# Patient Record
Sex: Male | Born: 1945 | Race: White | Hispanic: No | Smoking: Current every day smoker
Health system: Southern US, Community
[De-identification: ages and names within clinical notes are randomized; demographics above are authoritative.]

## PROBLEM LIST (undated history)

## (undated) DIAGNOSIS — K603 Anal fistula, unspecified: Secondary | ICD-10-CM

## (undated) DIAGNOSIS — J449 Chronic obstructive pulmonary disease, unspecified: Secondary | ICD-10-CM

## (undated) DIAGNOSIS — Z87828 Personal history of other (healed) physical injury and trauma: Secondary | ICD-10-CM

## (undated) DIAGNOSIS — F329 Major depressive disorder, single episode, unspecified: Secondary | ICD-10-CM

## (undated) DIAGNOSIS — E119 Type 2 diabetes mellitus without complications: Secondary | ICD-10-CM

## (undated) DIAGNOSIS — M1990 Unspecified osteoarthritis, unspecified site: Secondary | ICD-10-CM

## (undated) DIAGNOSIS — E785 Hyperlipidemia, unspecified: Secondary | ICD-10-CM

## (undated) DIAGNOSIS — I10 Essential (primary) hypertension: Secondary | ICD-10-CM

## (undated) DIAGNOSIS — J309 Allergic rhinitis, unspecified: Secondary | ICD-10-CM

## (undated) DIAGNOSIS — F419 Anxiety disorder, unspecified: Secondary | ICD-10-CM

## (undated) DIAGNOSIS — N189 Chronic kidney disease, unspecified: Secondary | ICD-10-CM

## (undated) DIAGNOSIS — F32A Depression, unspecified: Secondary | ICD-10-CM

## (undated) HISTORY — DX: Anxiety disorder, unspecified: F41.9

## (undated) HISTORY — DX: Chronic kidney disease, unspecified: N18.9

## (undated) HISTORY — DX: Chronic obstructive pulmonary disease, unspecified: J44.9

## (undated) HISTORY — DX: Allergic rhinitis, unspecified: J30.9

## (undated) HISTORY — DX: Depression, unspecified: F32.A

## (undated) HISTORY — DX: Major depressive disorder, single episode, unspecified: F32.9

## (undated) HISTORY — PX: APPENDECTOMY: SHX54

---

## 1994-01-16 HISTORY — PX: INGUINAL HERNIA REPAIR: SUR1180

## 1999-06-03 ENCOUNTER — Encounter: Payer: Self-pay | Admitting: Orthopedic Surgery

## 1999-06-03 ENCOUNTER — Ambulatory Visit: Admission: RE | Admit: 1999-06-03 | Discharge: 1999-06-03 | Payer: Self-pay | Admitting: Orthopedic Surgery

## 2000-07-04 ENCOUNTER — Encounter: Payer: Self-pay | Admitting: Emergency Medicine

## 2000-07-04 ENCOUNTER — Emergency Department (HOSPITAL_COMMUNITY): Admission: EM | Admit: 2000-07-04 | Discharge: 2000-07-04 | Payer: Self-pay | Admitting: Emergency Medicine

## 2001-01-16 HISTORY — PX: ROTATOR CUFF REPAIR: SHX139

## 2005-04-06 ENCOUNTER — Encounter: Admission: RE | Admit: 2005-04-06 | Discharge: 2005-04-06 | Payer: Self-pay | Admitting: Internal Medicine

## 2012-04-18 ENCOUNTER — Other Ambulatory Visit: Payer: Self-pay | Admitting: Cardiology

## 2012-04-18 ENCOUNTER — Ambulatory Visit
Admission: RE | Admit: 2012-04-18 | Discharge: 2012-04-18 | Disposition: A | Discharge status: Home / Self Care | Payer: BC Managed Care – PPO | Source: Ambulatory Visit | Attending: Cardiology | Admitting: Cardiology

## 2012-04-18 DIAGNOSIS — R0602 Shortness of breath: Secondary | ICD-10-CM

## 2012-09-17 DIAGNOSIS — E785 Hyperlipidemia, unspecified: Secondary | ICD-10-CM | POA: Diagnosis not present

## 2012-09-17 DIAGNOSIS — I10 Essential (primary) hypertension: Secondary | ICD-10-CM | POA: Diagnosis not present

## 2012-09-17 DIAGNOSIS — E119 Type 2 diabetes mellitus without complications: Secondary | ICD-10-CM | POA: Diagnosis not present

## 2012-09-17 DIAGNOSIS — E669 Obesity, unspecified: Secondary | ICD-10-CM | POA: Diagnosis not present

## 2012-10-11 DIAGNOSIS — E119 Type 2 diabetes mellitus without complications: Secondary | ICD-10-CM | POA: Diagnosis not present

## 2012-10-11 DIAGNOSIS — Z6841 Body Mass Index (BMI) 40.0 and over, adult: Secondary | ICD-10-CM | POA: Diagnosis not present

## 2012-10-11 DIAGNOSIS — I10 Essential (primary) hypertension: Secondary | ICD-10-CM | POA: Diagnosis not present

## 2012-11-07 DIAGNOSIS — F17200 Nicotine dependence, unspecified, uncomplicated: Secondary | ICD-10-CM | POA: Diagnosis not present

## 2012-11-07 DIAGNOSIS — I10 Essential (primary) hypertension: Secondary | ICD-10-CM | POA: Diagnosis not present

## 2012-11-07 DIAGNOSIS — E119 Type 2 diabetes mellitus without complications: Secondary | ICD-10-CM | POA: Diagnosis not present

## 2012-11-07 DIAGNOSIS — E785 Hyperlipidemia, unspecified: Secondary | ICD-10-CM | POA: Diagnosis not present

## 2013-01-14 DIAGNOSIS — I10 Essential (primary) hypertension: Secondary | ICD-10-CM | POA: Diagnosis not present

## 2013-01-14 DIAGNOSIS — Z79899 Other long term (current) drug therapy: Secondary | ICD-10-CM | POA: Diagnosis not present

## 2013-01-14 DIAGNOSIS — Z0000 Encounter for general adult medical examination without abnormal findings: Secondary | ICD-10-CM | POA: Diagnosis not present

## 2013-01-14 DIAGNOSIS — E785 Hyperlipidemia, unspecified: Secondary | ICD-10-CM | POA: Diagnosis not present

## 2013-01-14 DIAGNOSIS — F17200 Nicotine dependence, unspecified, uncomplicated: Secondary | ICD-10-CM | POA: Diagnosis not present

## 2013-03-18 DIAGNOSIS — E119 Type 2 diabetes mellitus without complications: Secondary | ICD-10-CM | POA: Diagnosis not present

## 2013-03-18 DIAGNOSIS — Z79899 Other long term (current) drug therapy: Secondary | ICD-10-CM | POA: Diagnosis not present

## 2013-03-18 DIAGNOSIS — I10 Essential (primary) hypertension: Secondary | ICD-10-CM | POA: Diagnosis not present

## 2013-03-18 DIAGNOSIS — E785 Hyperlipidemia, unspecified: Secondary | ICD-10-CM | POA: Diagnosis not present

## 2013-06-18 DIAGNOSIS — I10 Essential (primary) hypertension: Secondary | ICD-10-CM | POA: Diagnosis not present

## 2013-06-18 DIAGNOSIS — E785 Hyperlipidemia, unspecified: Secondary | ICD-10-CM | POA: Diagnosis not present

## 2013-06-18 DIAGNOSIS — E119 Type 2 diabetes mellitus without complications: Secondary | ICD-10-CM | POA: Diagnosis not present

## 2013-06-18 DIAGNOSIS — Z79899 Other long term (current) drug therapy: Secondary | ICD-10-CM | POA: Diagnosis not present

## 2014-03-27 DIAGNOSIS — R3915 Urgency of urination: Secondary | ICD-10-CM | POA: Diagnosis not present

## 2014-03-27 DIAGNOSIS — E1165 Type 2 diabetes mellitus with hyperglycemia: Secondary | ICD-10-CM | POA: Diagnosis not present

## 2014-03-27 DIAGNOSIS — N508 Other specified disorders of male genital organs: Secondary | ICD-10-CM | POA: Diagnosis not present

## 2014-03-27 DIAGNOSIS — E559 Vitamin D deficiency, unspecified: Secondary | ICD-10-CM | POA: Diagnosis not present

## 2014-03-27 DIAGNOSIS — I10 Essential (primary) hypertension: Secondary | ICD-10-CM | POA: Diagnosis not present

## 2014-03-27 DIAGNOSIS — R5383 Other fatigue: Secondary | ICD-10-CM | POA: Diagnosis not present

## 2014-03-27 DIAGNOSIS — E782 Mixed hyperlipidemia: Secondary | ICD-10-CM | POA: Diagnosis not present

## 2014-03-27 LAB — BASIC METABOLIC PANEL
BUN: 25 mg/dL — AB (ref 4–21)
Creatinine: 1.1 mg/dL (ref 0.6–1.3)
GLUCOSE: 116 mg/dL
Potassium: 4.1 mmol/L (ref 3.4–5.3)
Sodium: 140 mmol/L (ref 137–147)

## 2014-03-27 LAB — LIPID PANEL
Cholesterol: 95 mg/dL (ref 0–200)
HDL: 44 mg/dL (ref 35–70)
LDL CALC: 17 mg/dL
TRIGLYCERIDES: 170 mg/dL — AB (ref 40–160)

## 2014-03-27 LAB — HEPATIC FUNCTION PANEL
ALK PHOS: 46 U/L (ref 25–125)
ALT: 21 U/L (ref 10–40)
AST: 15 U/L (ref 14–40)
BILIRUBIN, TOTAL: 0.5 mg/dL

## 2014-03-27 LAB — HEMOGLOBIN A1C: Hemoglobin A1C: 6.2

## 2014-03-27 LAB — CBC AND DIFFERENTIAL
HEMATOCRIT: 43 % (ref 41–53)
Hemoglobin: 14 g/dL (ref 13.5–17.5)
PLATELETS: 211 10*3/uL (ref 150–399)
WBC: 7.3 10*3/mL

## 2014-03-27 LAB — PSA: PSA: 0.5

## 2014-03-27 LAB — TSH: TSH: 4.41 u[IU]/mL (ref 0.41–5.90)

## 2014-04-24 DIAGNOSIS — R3915 Urgency of urination: Secondary | ICD-10-CM | POA: Diagnosis not present

## 2014-05-29 DIAGNOSIS — N433 Hydrocele, unspecified: Secondary | ICD-10-CM | POA: Diagnosis not present

## 2014-06-17 ENCOUNTER — Ambulatory Visit (INDEPENDENT_AMBULATORY_CARE_PROVIDER_SITE_OTHER): Payer: BLUE CROSS/BLUE SHIELD

## 2014-06-17 ENCOUNTER — Ambulatory Visit (INDEPENDENT_AMBULATORY_CARE_PROVIDER_SITE_OTHER): Payer: BLUE CROSS/BLUE SHIELD | Admitting: Physician Assistant

## 2014-06-17 VITALS — BP 140/88 | HR 83 | Temp 98.6°F | Resp 18 | Ht 73.0 in | Wt 256.6 lb

## 2014-06-17 DIAGNOSIS — E119 Type 2 diabetes mellitus without complications: Secondary | ICD-10-CM

## 2014-06-17 DIAGNOSIS — M25512 Pain in left shoulder: Secondary | ICD-10-CM

## 2014-06-17 DIAGNOSIS — M7540 Impingement syndrome of unspecified shoulder: Secondary | ICD-10-CM | POA: Insufficient documentation

## 2014-06-17 DIAGNOSIS — E1165 Type 2 diabetes mellitus with hyperglycemia: Secondary | ICD-10-CM | POA: Insufficient documentation

## 2014-06-17 DIAGNOSIS — M7542 Impingement syndrome of left shoulder: Secondary | ICD-10-CM | POA: Diagnosis not present

## 2014-06-17 LAB — GLUCOSE, POCT (MANUAL RESULT ENTRY): POC Glucose: 105 mg/dl — AB (ref 70–99)

## 2014-06-17 LAB — POCT GLYCOSYLATED HEMOGLOBIN (HGB A1C): Hemoglobin A1C: 6.3

## 2014-06-17 MED ORDER — METFORMIN HCL ER 750 MG PO TB24: 750.0000 mg | ORAL_TABLET | Freq: Two times a day (BID) | ORAL | Status: DC

## 2014-06-17 MED ORDER — TRAMADOL HCL 50 MG PO TABS: 50.0000 mg | ORAL_TABLET | Freq: Three times a day (TID) | ORAL | Status: DC | PRN

## 2014-06-17 NOTE — Progress Notes (Addendum)
Subjective:    Patient ID: John Blake, male    DOB: 03-27-1945, 69 y.o.   MRN: [id number]  HPI Patient presents for refill of metformin and shoulder pain.  Has been taking metformin for 1 month for pre-diabetes that was diagnosed earlier this year. Unsure what medication he was on initially, but discontinued due to cost. No longer has PCP as office has closed down. Medication has been tolerated well and has not missed any doses. Denies SOB, CP, edema, HA/dizziness, or N/V. Does not check glucose at home.   Shoulder pain has been present for several months, but has gotten worse as now the pain is keeping him up at night. Pain is 8/10 on pain scale and is throbbing in quality. Pain radiates up to the neck at times. Can not raise arm higher than chest. Has been unable to reach up into cabinets for at least a month. Denies numbnessor tingling of shoulder or lower extremity. Right shoulder went through something similar before he had his rotator cuff surgery.   NKDA.   Review of Systems As noted above.     Objective:   Physical Exam  Constitutional: He is oriented to person, place, and time. He appears well-developed and well-nourished. No distress.  Blood pressure 140/88, pulse 83, temperature 98.6 F (37 C), temperature source Oral, resp. rate 18, height 6\' 1"  (1.854 m), weight 256 lb 9.6 oz (116.393 kg), SpO2 98 %.  HENT:  Head: Normocephalic and atraumatic.  Right Ear: External ear normal.  Left Ear: External ear normal.  Eyes: Conjunctivae are normal. Pupils are equal, round, and reactive to light. Right eye exhibits no discharge. Left eye exhibits no discharge. No scleral icterus.  Neck: Normal range of motion. Neck supple.  Cardiovascular: Normal rate, regular rhythm, normal heart sounds and intact distal pulses.  Exam reveals no gallop and no friction rub.   No murmur heard. Pulmonary/Chest: Effort normal and breath sounds normal. No respiratory distress. He has no wheezes. He  has no rales.  Musculoskeletal: He exhibits tenderness. He exhibits no edema.       Left shoulder: He exhibits decreased range of motion (Able to raise arm to approx 70 degrees), tenderness and pain. He exhibits no bony tenderness, no swelling, no effusion, no crepitus, no deformity, no laceration, no spasm and normal strength.       Cervical back: He exhibits tenderness. He exhibits normal range of motion, no bony tenderness, no swelling, no edema, no deformity, no laceration, no pain and no spasm.       Thoracic back: Normal.       Left hand: Normal.  Positive Neer's and Hawkin's test. Negative Crossover test.  Neurological: He is alert and oriented to person, place, and time. He has normal strength and normal reflexes. He displays no atrophy. He exhibits normal muscle tone. Coordination normal.  Skin: Skin is warm and dry. No rash noted. He is not diaphoretic. No erythema. No pallor.  Psychiatric: He has a normal mood and affect. His behavior is normal. Judgment and thought content normal.   [facility] reading (PRIMARY) by  Dr. Carlota Raspberry. Degenerative changes at [facility] and glenohumeral joints. No acute fractures.       Assessment & Plan:  1. Type 2 diabetes mellitus without complication RTC in 3 months for DM f/u. - POCT glucose (manual entry) - POCT glycosylated hemoglobin (Hb A1C) - Comprehensive metabolic panel  2. Pain in joint, shoulder region, left 3. Shoulder impingement syndrome, left Improvement of pain  with lido/kenalog injection, however, no change in ROM. Shoulder exercises given and discussed.  - DG Shoulder Left; Future - traMADol (ULTRAM) 50 MG tablet; Take 1 tablet (50 mg total) by mouth every 8 (eight) hours as needed.  Dispense: 30 tablet; Refill: 0 - AMB referral to orthopedics   Alveta Heimlich PA-C  [facility] 06/17/2014 8:01 PM

## 2014-06-17 NOTE — Patient Instructions (Signed)

## 2014-06-18 LAB — COMPREHENSIVE METABOLIC PANEL
ALBUMIN: 4.2 g/dL (ref 3.5–5.2)
ALK PHOS: 47 U/L (ref 39–117)
ALT: 23 U/L (ref 0–53)
AST: 19 U/L (ref 0–37)
BUN: 27 mg/dL — ABNORMAL HIGH (ref 6–23)
CO2: 22 mEq/L (ref 19–32)
CREATININE: 1.18 mg/dL (ref 0.50–1.35)
Calcium: 9.1 mg/dL (ref 8.4–10.5)
Chloride: 108 mEq/L (ref 96–112)
Glucose, Bld: 99 mg/dL (ref 70–99)
Potassium: 4 mEq/L (ref 3.5–5.3)
SODIUM: 137 meq/L (ref 135–145)
Total Bilirubin: 0.5 mg/dL (ref 0.2–1.2)
Total Protein: 6.7 g/dL (ref 6.0–8.3)

## 2014-06-23 ENCOUNTER — Encounter: Payer: Self-pay | Admitting: Physician Assistant

## 2014-08-01 ENCOUNTER — Encounter: Payer: Self-pay | Admitting: *Deleted

## 2014-08-01 ENCOUNTER — Ambulatory Visit (INDEPENDENT_AMBULATORY_CARE_PROVIDER_SITE_OTHER): Payer: BLUE CROSS/BLUE SHIELD | Admitting: Family Medicine

## 2014-08-01 VITALS — BP 128/76 | HR 105 | Temp 99.4°F | Resp 18 | Ht 73.0 in | Wt 245.4 lb

## 2014-08-01 DIAGNOSIS — K641 Second degree hemorrhoids: Secondary | ICD-10-CM | POA: Diagnosis not present

## 2014-08-01 DIAGNOSIS — K645 Perianal venous thrombosis: Secondary | ICD-10-CM | POA: Diagnosis not present

## 2014-08-01 DIAGNOSIS — K6289 Other specified diseases of anus and rectum: Secondary | ICD-10-CM | POA: Diagnosis not present

## 2014-08-01 MED ORDER — HYDROCODONE-ACETAMINOPHEN 5-325 MG PO TABS: 1.0000 | ORAL_TABLET | Freq: Four times a day (QID) | ORAL | Status: DC | PRN

## 2014-08-01 MED ORDER — CIPROFLOXACIN HCL 500 MG PO TABS: 500.0000 mg | ORAL_TABLET | Freq: Two times a day (BID) | ORAL | Status: DC

## 2014-08-01 MED ORDER — HYDROCORTISONE ACE-PRAMOXINE 2.5-1 % RE CREA: 1.0000 "application " | TOPICAL_CREAM | Freq: Three times a day (TID) | RECTAL | Status: DC

## 2014-08-01 NOTE — Progress Notes (Signed)
  Subjective:  Patient ID: John Blake, male    DOB: Mar 28, 1945  Age: 69 y.o. MRN: [id number]  69 year old male who has just come back from the beach. Riding down he developed anal pain. It is continued to persist. It is hard for him to sit now. He has had hemorrhoids in the past but this is different. He has not felt any major hemorrhoids. He hurts a lot right it is but. No nausea or vomiting or diarrhea or constipation. He has had a decreased appetite. He is diabetic.  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 He thinks he was okay to work Monday, however I told to him that I would give him an excuse for Monday if he needs it.  Objective:   Abdomen is large, no specific masses appreciated, no tenderness. He feels full. Digital rectal exam reveals the external anal area to have probably 4 separate hemorrhoidal veins swollen out, not large. They do not look inflamed. They're little tender but he is more tender in an indurated area of tissue at about the 11:00 position. It is not fluctuant. Feel like it is abscess. It may be old chronic induration of the tissues, but it does seem more tender.  Assessment & Plan:   Assessment: Hemorrhoids Anal pain Swallowing, of concern for possible early abscess.  Plan: Patient Instructions  Take the ciprofloxacin one twice daily for possible infection  Take the hydrocodone pain pills every 6 hours if needed for bad pain  Use the Analpram cream 2-4 times daily as directed  Take Colace per instructions on the container daily for stool softener   Drink plenty of fluids and eat a high-fiber diet  If constipated takes some MiraLAX  Stay off work Monday if still having a lot of pain  If not improving in 3 or 4 days return for a recheck     HOPPER,DAVID, MD 08/01/2014

## 2014-08-01 NOTE — Patient Instructions (Signed)
Take the ciprofloxacin one twice daily for possible infection  Take the hydrocodone pain pills every 6 hours if needed for bad pain  Use the Analpram cream 2-4 times daily as directed  Take Colace per instructions on the container daily for stool softener   Drink plenty of fluids and eat a high-fiber diet  If constipated takes some MiraLAX  Stay off work Monday if still having a lot of pain  If not improving in 3 or 4 days return for a recheck

## 2014-08-03 ENCOUNTER — Ambulatory Visit (INDEPENDENT_AMBULATORY_CARE_PROVIDER_SITE_OTHER): Payer: BLUE CROSS/BLUE SHIELD | Admitting: Family Medicine

## 2014-08-03 VITALS — BP 102/54 | HR 86 | Temp 98.2°F | Resp 18 | Ht 73.0 in | Wt 243.0 lb

## 2014-08-03 DIAGNOSIS — R42 Dizziness and giddiness: Secondary | ICD-10-CM

## 2014-08-03 DIAGNOSIS — K6289 Other specified diseases of anus and rectum: Secondary | ICD-10-CM

## 2014-08-03 DIAGNOSIS — E119 Type 2 diabetes mellitus without complications: Secondary | ICD-10-CM

## 2014-08-03 LAB — POCT CBC
Granulocyte percent: 77 %G (ref 37–80)
HCT, POC: 40.1 % — AB (ref 43.5–53.7)
Hemoglobin: 13.5 g/dL — AB (ref 14.1–18.1)
LYMPH, POC: 2.6 (ref 0.6–3.4)
MCH, POC: 31.4 pg — AB (ref 27–31.2)
MCHC: 33.7 g/dL (ref 31.8–35.4)
MCV: 93.1 fL (ref 80–97)
MID (cbc): 1.2 — AB (ref 0–0.9)
MPV: 8.4 fL (ref 0–99.8)
POC Granulocyte: 12.7 — AB (ref 2–6.9)
POC LYMPH %: 15.9 % (ref 10–50)
POC MID %: 7.1 %M (ref 0–12)
Platelet Count, POC: 282 10*3/uL (ref 142–424)
RBC: 4.31 M/uL — AB (ref 4.69–6.13)
RDW, POC: 13.8 %
WBC: 16.5 10*3/uL — AB (ref 4.6–10.2)

## 2014-08-03 LAB — GLUCOSE, POCT (MANUAL RESULT ENTRY): POC GLUCOSE: 131 mg/dL — AB (ref 70–99)

## 2014-08-03 NOTE — Patient Instructions (Addendum)
Drink lots of fluids until you are urinating more often  If you lightheadedness gets worse go to the emergency room  Referral has been made to [facility] surgery. The appointment is at 2:30 PM but they request that you be there at 2 PM 2 do the necessary paperwork. Your appointment is with a Dr. Redmond Pulling.  Take the MiraLAX twice daily if you have not had a bowel movement.  Try and do some sitz baths, soaking your bottom in a tub.  Continue using the cream and taking the oral anabiotic. The white blood count is elevated and suspicious for this being caused by an infection, so the anabiotic are very important.

## 2014-08-03 NOTE — Progress Notes (Signed)
  Subjective:  Patient ID: John Blake, male    DOB: [birth year]  Age: 68 y.o. MRN: [id number]  Patient continues to be pretty miserable. I saw him 2 days ago. He has not taken pain pills, but he has taken some MiraLAX. He did not take the Colace. He continues to hurt a lot in his rectal area. He has not been able to move his bowels, and is not urinating much. He says he has been drinking a lot of water. However he has been lightheaded some. He is diabetic but takes his medication and checks his sugars. This does not seem to have been a big problem to him. He has a hard time sitting and getting comfortable.   Objective:   Appears in a good deal of discomfort. Chest clear. Heart regular without murmurs. His rectal exam reveals prominence of the hemorrhoidal pain especially at about the 4:00 position but that is not really a tender hemorrhoid. Digital exam reveals a soft swelling especially on the left side of the rectum that is very tender. There is not erythema. I'm uncertain what this represents.  Results for orders placed or performed in visit on 08/03/14  POCT CBC  Result Value Ref Range   WBC 16.5 (A) 4.6 - 10.2 K/uL   Lymph, poc 2.6 0.6 - 3.4   POC LYMPH PERCENT 15.9 10 - 50 %L   MID (cbc) 1.2 (A) 0 - 0.9   POC MID % 7.1 0 - 12 %M   POC Granulocyte 12.7 (A) 2 - 6.9   Granulocyte percent 77.0 37 - 80 %G   RBC 4.31 (A) 4.69 - 6.13 M/uL   Hemoglobin 13.5 (A) 14.1 - 18.1 g/dL   HCT, POC 40.1 (A) 43.5 - 53.7 %   MCV 93.1 80 - 97 fL   MCH, POC 31.4 (A) 27 - 31.2 pg   MCHC 33.7 31.8 - 35.4 g/dL   RDW, POC 13.8 %   Platelet Count, POC 282 142 - 424 K/uL   MPV 8.4 0 - 99.8 fL  POCT glucose (manual entry)  Result Value Ref Range   POC Glucose 131 (A) 70 - 99 mg/dl     Assessment & Plan:   Assessment: Rectal pain and tenderness Diabetes Lightheadedness Constipation  Plan: Will check CBC and glucose and C met. I spoke with [place] [facility] surgery and he will be seen in their  urgent clinic by Dr. Redmond Pulling tomorrow.  With the elevated white blood count I am concerned that this is from infection. The surgeon may want need to do a CT scan or something more to figure out etiology of the pain. Patient Instructions  Drink lots of fluids until you are urinating more often  If you lightheadedness gets worse go to the emergency room  Referral has been made to [facility] surgery. The appointment is at 2:30 PM but they request that you be there at 2 PM 2 do the necessary paperwork. Your appointment is with a Dr. Redmond Pulling.  Take the MiraLAX twice daily if you have not had a bowel movement.  Try and do some sitz baths, soaking your bottom in a tub.  Continue using the cream and taking the oral anabiotic    Smt. Loder, MD 08/03/2014

## 2014-08-04 LAB — COMPREHENSIVE METABOLIC PANEL
ALK PHOS: 65 U/L (ref 39–117)
ALT: 25 U/L (ref 0–53)
AST: 25 U/L (ref 0–37)
Albumin: 4.1 g/dL (ref 3.5–5.2)
BUN: 52 mg/dL — ABNORMAL HIGH (ref 6–23)
CALCIUM: 9.7 mg/dL (ref 8.4–10.5)
CHLORIDE: 98 meq/L (ref 96–112)
CO2: 24 mEq/L (ref 19–32)
CREATININE: 2.41 mg/dL — AB (ref 0.50–1.35)
Glucose, Bld: 133 mg/dL — ABNORMAL HIGH (ref 70–99)
Potassium: 3.7 mEq/L (ref 3.5–5.3)
SODIUM: 139 meq/L (ref 135–145)
TOTAL PROTEIN: 7.3 g/dL (ref 6.0–8.3)
Total Bilirubin: 0.7 mg/dL (ref 0.2–1.2)

## 2014-08-05 DIAGNOSIS — K611 Rectal abscess: Secondary | ICD-10-CM | POA: Diagnosis not present

## 2014-09-18 ENCOUNTER — Other Ambulatory Visit: Payer: Self-pay | Admitting: Physician Assistant

## 2014-09-30 ENCOUNTER — Other Ambulatory Visit: Payer: Self-pay | Admitting: General Surgery

## 2014-09-30 NOTE — H&P (Signed)
Lawyer H. Mattila 09/30/2014 3:51 PM Location: [facility] Patient #: [id number] DOB: 04-26-1945 Married / Language: Cleophus Molt / Race: White Male History of Present Illness [name] MD; 99991111 4:02 PM) Patient words: reck.  The patient is a 69 year old male who presents with a perirectal abscess. 69 year old male approximately 8 weeks status post I&D of perirectal abscess. He presents to the office with continued purulent drainage. This is occasionally blood as well. Patient is an avid smoker. He denies any rectal pain or fevers. 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  Diagnostic Studies History [name], MD; 99991111 4:05 PM) Colonoscopy 5-10 years ago  Allergies Davy Pique Bynum, [place]; 09/30/2014 3:51 PM) No Known Drug Allergies 08/05/2014  Medication History (Sonya Bynum, CMA; 09/30/2014 3:51 PM) Atorvastatin Calcium (20MG  Tablet, Oral) Active. Ciprofloxacin HCl (500MG  Tablet, Oral) Active. Janumet (50-500MG  Tablet, Oral) Active. Valsartan-Hydrochlorothiazide (320-25MG  Tablet, Oral) Active. MetFORMIN HCl ER (750MG  Tablet ER 24HR, Oral) Active. Hydrocortisone Ace-Pramoxine (2.5-1% Cream, Rectal) Active. Norco (5-325MG  Tablet, Oral) Active. Medications Reconciled  Social History [name], MD; 99991111 4:05 PM) Caffeine use Coffee. No drug use Alcohol use Moderate alcohol use. Tobacco use Current every day smoker.  Family History [name], MD; 99991111 4:05 PM) Family history unknown First Degree  Relatives    Vitals ([place]; 09/30/2014 3:51 PM) 09/30/2014 3:51 PM Weight: 251 lb Height: 73in Body Surface Area: 2.42 m Body Mass Index: 33.12 kg/m Temp.: 35F(Temporal)  Pulse: 81 (Regular)  BP: 134/74 (Sitting, Left Arm, Standard)     Physical Exam [name] MD; 99991111 4:03 PM)  General Mental Status-Alert. General Appearance-Consistent with stated age. Hydration-Well hydrated. Voice-Normal.  Head and Neck Head-normocephalic, atraumatic with no lesions or palpable masses. Trachea-midline.  Eye Eyeball - Bilateral-Extraocular movements intact. Sclera/Conjunctiva - Bilateral-No scleral icterus.  Chest and Lung Exam Chest and lung exam reveals -quiet, even and easy respiratory effort with no use of accessory muscles and on auscultation, normal breath sounds, no adventitious sounds and normal vocal resonance. Inspection Chest Wall - Normal. Back - normal.  Cardiovascular Cardiovascular examination reveals -normal heart sounds, regular rate and rhythm with no murmurs and normal pedal pulses bilaterally.  Abdomen Inspection Inspection of the abdomen reveals - No Hernias. Palpation/Percussion Palpation and Percussion of the abdomen reveal - Soft, Non Tender, No Rebound tenderness, No Rigidity (guarding) and No hepatosplenomegaly. Auscultation Auscultation of the abdomen reveals - Bowel sounds normal.  Rectal Anorectal Exam External - Note: Left posterior external opening, fistula probe easily progresses towards midline posteriorly.  Neurologic Neurologic evaluation reveals -alert and oriented x 3 with no impairment of recent or remote memory. Mental Status-Normal.  Musculoskeletal Global Assessment -Note:no gross deformities.  Normal Exam - Left-Upper Extremity Strength Normal and Lower Extremity Strength Normal. Normal Exam - Right-Upper Extremity Strength Normal and Lower Extremity Strength  Normal.    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 I have recommended anal exam under anesthesia with possible fistulotomy versus seton placement. We discussed this in detail. We discussed the minimal risk of  incontinence with fistulotomy. We discussed the need for second surgery with seton placement.

## 2014-10-19 ENCOUNTER — Other Ambulatory Visit: Payer: Self-pay | Admitting: Physician Assistant

## 2014-10-22 ENCOUNTER — Other Ambulatory Visit: Payer: Self-pay | Admitting: Physician Assistant

## 2014-10-26 ENCOUNTER — Encounter (HOSPITAL_BASED_OUTPATIENT_CLINIC_OR_DEPARTMENT_OTHER): Payer: Self-pay | Admitting: *Deleted

## 2014-10-26 NOTE — Progress Notes (Signed)
NPO AFTER MN.  ARRIVE AT 0600.  NEEDS ISTAT AND EKG.

## 2014-10-30 ENCOUNTER — Ambulatory Visit (HOSPITAL_BASED_OUTPATIENT_CLINIC_OR_DEPARTMENT_OTHER)
Admission: RE | Admit: 2014-10-30 | Discharge: 2014-10-30 | Disposition: A | Discharge status: Home / Self Care | Payer: BLUE CROSS/BLUE SHIELD | Source: Ambulatory Visit | Attending: General Surgery | Admitting: General Surgery

## 2014-10-30 ENCOUNTER — Ambulatory Visit (HOSPITAL_BASED_OUTPATIENT_CLINIC_OR_DEPARTMENT_OTHER): Payer: BLUE CROSS/BLUE SHIELD | Admitting: Anesthesiology

## 2014-10-30 ENCOUNTER — Encounter (HOSPITAL_BASED_OUTPATIENT_CLINIC_OR_DEPARTMENT_OTHER)
Admission: RE | Disposition: A | Discharge status: Home / Self Care | Payer: Self-pay | Source: Ambulatory Visit | Attending: General Surgery

## 2014-10-30 ENCOUNTER — Other Ambulatory Visit: Payer: Self-pay

## 2014-10-30 ENCOUNTER — Encounter (HOSPITAL_BASED_OUTPATIENT_CLINIC_OR_DEPARTMENT_OTHER): Payer: Self-pay | Admitting: *Deleted

## 2014-10-30 DIAGNOSIS — K644 Residual hemorrhoidal skin tags: Secondary | ICD-10-CM | POA: Diagnosis not present

## 2014-10-30 DIAGNOSIS — Z792 Long term (current) use of antibiotics: Secondary | ICD-10-CM | POA: Insufficient documentation

## 2014-10-30 DIAGNOSIS — Z7952 Long term (current) use of systemic steroids: Secondary | ICD-10-CM | POA: Insufficient documentation

## 2014-10-30 DIAGNOSIS — K648 Other hemorrhoids: Secondary | ICD-10-CM | POA: Insufficient documentation

## 2014-10-30 DIAGNOSIS — E7800 Pure hypercholesterolemia, unspecified: Secondary | ICD-10-CM | POA: Diagnosis not present

## 2014-10-30 DIAGNOSIS — I10 Essential (primary) hypertension: Secondary | ICD-10-CM | POA: Insufficient documentation

## 2014-10-30 DIAGNOSIS — M1990 Unspecified osteoarthritis, unspecified site: Secondary | ICD-10-CM | POA: Diagnosis not present

## 2014-10-30 DIAGNOSIS — Z7984 Long term (current) use of oral hypoglycemic drugs: Secondary | ICD-10-CM | POA: Insufficient documentation

## 2014-10-30 DIAGNOSIS — Z79899 Other long term (current) drug therapy: Secondary | ICD-10-CM | POA: Diagnosis not present

## 2014-10-30 DIAGNOSIS — E119 Type 2 diabetes mellitus without complications: Secondary | ICD-10-CM | POA: Insufficient documentation

## 2014-10-30 DIAGNOSIS — K611 Rectal abscess: Secondary | ICD-10-CM | POA: Diagnosis present

## 2014-10-30 DIAGNOSIS — Z79891 Long term (current) use of opiate analgesic: Secondary | ICD-10-CM | POA: Diagnosis not present

## 2014-10-30 DIAGNOSIS — F17200 Nicotine dependence, unspecified, uncomplicated: Secondary | ICD-10-CM | POA: Diagnosis not present

## 2014-10-30 DIAGNOSIS — K603 Anal fistula: Secondary | ICD-10-CM | POA: Insufficient documentation

## 2014-10-30 HISTORY — PX: EVALUATION UNDER ANESTHESIA WITH FISTULECTOMY: SHX5623

## 2014-10-30 HISTORY — DX: Anal fistula: K60.3

## 2014-10-30 HISTORY — DX: Hyperlipidemia, unspecified: E78.5

## 2014-10-30 HISTORY — DX: Personal history of other (healed) physical injury and trauma: Z87.828

## 2014-10-30 HISTORY — DX: Essential (primary) hypertension: I10

## 2014-10-30 HISTORY — DX: Unspecified osteoarthritis, unspecified site: M19.90

## 2014-10-30 HISTORY — DX: Type 2 diabetes mellitus without complications: E11.9

## 2014-10-30 HISTORY — DX: Anal fistula, unspecified: K60.30

## 2014-10-30 LAB — POCT I-STAT 4, (NA,K, GLUC, HGB,HCT)
Glucose, Bld: 128 mg/dL — ABNORMAL HIGH (ref 65–99)
HEMATOCRIT: 39 % (ref 39.0–52.0)
HEMOGLOBIN: 13.3 g/dL (ref 13.0–17.0)
Potassium: 3.9 mmol/L (ref 3.5–5.1)
SODIUM: 141 mmol/L (ref 135–145)

## 2014-10-30 LAB — GLUCOSE, CAPILLARY: Glucose-Capillary: 142 mg/dL — ABNORMAL HIGH (ref 65–99)

## 2014-10-30 SURGERY — EXAM UNDER ANESTHESIA WITH FISTULECTOMY
Anesthesia: Monitor Anesthesia Care | Site: Rectum

## 2014-10-30 MED ORDER — PROMETHAZINE HCL 25 MG/ML IJ SOLN
6.2500 mg | INTRAMUSCULAR | Status: DC | PRN
  Filled 2014-10-30: qty 1

## 2014-10-30 MED ORDER — SODIUM CHLORIDE 0.9 % IJ SOLN
3.0000 mL | INTRAMUSCULAR | Status: DC | PRN
  Filled 2014-10-30: qty 3

## 2014-10-30 MED ORDER — ACETAMINOPHEN 325 MG PO TABS
650.0000 mg | ORAL_TABLET | ORAL | Status: DC | PRN
  Filled 2014-10-30: qty 2

## 2014-10-30 MED ORDER — ONDANSETRON HCL 4 MG/2ML IJ SOLN
INTRAMUSCULAR | Status: DC | PRN
  Administered 2014-10-30: 4 mg via INTRAVENOUS

## 2014-10-30 MED ORDER — HYDROMORPHONE HCL 1 MG/ML IJ SOLN
0.2500 mg | INTRAMUSCULAR | Status: DC | PRN
  Filled 2014-10-30: qty 1

## 2014-10-30 MED ORDER — LACTATED RINGERS IV SOLN
INTRAVENOUS | Status: DC
  Administered 2014-10-30: 07:00:00 via INTRAVENOUS
  Filled 2014-10-30: qty 1000

## 2014-10-30 MED ORDER — SODIUM CHLORIDE 0.9 % IV SOLN
250.0000 mg | INTRAVENOUS | Status: DC | PRN
  Administered 2014-10-30: 10 ug/kg/min via INTRAVENOUS

## 2014-10-30 MED ORDER — LIDOCAINE 5 % EX OINT
TOPICAL_OINTMENT | CUTANEOUS | Status: DC | PRN
  Administered 2014-10-30: 1

## 2014-10-30 MED ORDER — SODIUM CHLORIDE 0.9 % IV SOLN
250.0000 mL | INTRAVENOUS | Status: DC | PRN
  Filled 2014-10-30: qty 250

## 2014-10-30 MED ORDER — OXYCODONE HCL 5 MG PO TABS: 5.0000 mg | ORAL_TABLET | ORAL | Status: DC | PRN

## 2014-10-30 MED ORDER — ACETAMINOPHEN 650 MG RE SUPP
650.0000 mg | RECTAL | Status: DC | PRN
  Filled 2014-10-30: qty 1

## 2014-10-30 MED ORDER — 0.9 % SODIUM CHLORIDE (POUR BTL) OPTIME
TOPICAL | Status: DC | PRN
  Administered 2014-10-30: 500 mL

## 2014-10-30 MED ORDER — KETAMINE HCL 10 MG/ML IJ SOLN
INTRAMUSCULAR | Status: AC
  Filled 2014-10-30: qty 1

## 2014-10-30 MED ORDER — PROPOFOL 500 MG/50ML IV EMUL
INTRAVENOUS | Status: DC | PRN
  Administered 2014-10-30: 150 ug/kg/min via INTRAVENOUS

## 2014-10-30 MED ORDER — OXYCODONE HCL 5 MG PO TABS
5.0000 mg | ORAL_TABLET | Freq: Once | ORAL | Status: DC | PRN
  Filled 2014-10-30: qty 1

## 2014-10-30 MED ORDER — OXYCODONE HCL 5 MG PO TABS
5.0000 mg | ORAL_TABLET | ORAL | Status: DC | PRN
  Filled 2014-10-30: qty 2

## 2014-10-30 MED ORDER — SODIUM CHLORIDE 0.9 % IJ SOLN
3.0000 mL | Freq: Two times a day (BID) | INTRAMUSCULAR | Status: DC
  Filled 2014-10-30: qty 3

## 2014-10-30 MED ORDER — MIDAZOLAM HCL 2 MG/2ML IJ SOLN
INTRAMUSCULAR | Status: AC
  Filled 2014-10-30: qty 2

## 2014-10-30 MED ORDER — BUPIVACAINE-EPINEPHRINE 0.5% -1:200000 IJ SOLN
INTRAMUSCULAR | Status: DC | PRN
  Administered 2014-10-30: 30 mL

## 2014-10-30 MED ORDER — FENTANYL CITRATE (PF) 100 MCG/2ML IJ SOLN
INTRAMUSCULAR | Status: AC
  Filled 2014-10-30: qty 2

## 2014-10-30 MED ORDER — MIDAZOLAM HCL 5 MG/5ML IJ SOLN
INTRAMUSCULAR | Status: DC | PRN
  Administered 2014-10-30: 2 mg via INTRAVENOUS

## 2014-10-30 MED ORDER — OXYCODONE HCL 5 MG/5ML PO SOLN
5.0000 mg | Freq: Once | ORAL | Status: DC | PRN
  Filled 2014-10-30: qty 5

## 2014-10-30 SURGICAL SUPPLY — 62 items
BENZOIN TINCTURE PRP APPL 2/3 (GAUZE/BANDAGES/DRESSINGS) ×3 IMPLANT
BLADE HEX COATED 2.75 (ELECTRODE) IMPLANT
BLADE SURG 10 STRL SS (BLADE) IMPLANT
BLADE SURG 15 STRL LF DISP TIS (BLADE) ×2 IMPLANT
BLADE SURG 15 STRL SS (BLADE) ×1
BRIEF STRETCH FOR OB PAD LRG (UNDERPADS AND DIAPERS) ×3 IMPLANT
CANISTER SUCTION 2500CC (MISCELLANEOUS) ×3 IMPLANT
CLOTH BEACON ORANGE TIMEOUT ST (SAFETY) ×3 IMPLANT
COVER BACK TABLE 60X90IN (DRAPES) ×3 IMPLANT
COVER MAYO STAND STRL (DRAPES) ×3 IMPLANT
DECANTER SPIKE VIAL GLASS SM (MISCELLANEOUS) ×3 IMPLANT
DRAPE LG THREE QUARTER DISP (DRAPES) ×3 IMPLANT
DRAPE PED LAPAROTOMY (DRAPES) ×3 IMPLANT
DRAPE UNDERBUTTOCKS STRL (DRAPE) IMPLANT
DRAPE UTILITY XL STRL (DRAPES) ×3 IMPLANT
DRSG PAD ABDOMINAL 8X10 ST (GAUZE/BANDAGES/DRESSINGS) ×3 IMPLANT
ELECT BLADE 6.5 .24CM SHAFT (ELECTRODE) IMPLANT
ELECT REM PT RETURN 9FT ADLT (ELECTROSURGICAL) ×3
ELECTRODE REM PT RTRN 9FT ADLT (ELECTROSURGICAL) ×2 IMPLANT
GAUZE SPONGE 4X4 16PLY XRAY LF (GAUZE/BANDAGES/DRESSINGS) IMPLANT
GAUZE VASELINE 3X9 (GAUZE/BANDAGES/DRESSINGS) IMPLANT
GLOVE BIO SURGEON STRL SZ 6.5 (GLOVE) ×6 IMPLANT
GLOVE INDICATOR 7.0 STRL GRN (GLOVE) ×6 IMPLANT
GOWN STRL REUS W/ TWL LRG LVL3 (GOWN DISPOSABLE) ×2 IMPLANT
GOWN STRL REUS W/ TWL XL LVL3 (GOWN DISPOSABLE) ×4 IMPLANT
GOWN STRL REUS W/TWL 2XL LVL3 (GOWN DISPOSABLE) ×3 IMPLANT
GOWN STRL REUS W/TWL LRG LVL3 (GOWN DISPOSABLE) ×1
GOWN STRL REUS W/TWL XL LVL3 (GOWN DISPOSABLE) ×2
HYDROGEN PEROXIDE 16OZ (MISCELLANEOUS) IMPLANT
KIT ROOM TURNOVER WOR (KITS) ×3 IMPLANT
LEGGING LITHOTOMY PAIR STRL (DRAPES) IMPLANT
LOOP VESSEL MAXI BLUE (MISCELLANEOUS) IMPLANT
MANIFOLD NEPTUNE II (INSTRUMENTS) IMPLANT
NDL SAFETY ECLIPSE 18X1.5 (NEEDLE) IMPLANT
NEEDLE HYPO 18GX1.5 SHARP (NEEDLE)
NEEDLE HYPO 25X1 1.5 SAFETY (NEEDLE) ×3 IMPLANT
NS IRRIG 500ML POUR BTL (IV SOLUTION) ×3 IMPLANT
PACK BASIN DAY SURGERY FS (CUSTOM PROCEDURE TRAY) ×3 IMPLANT
PAD ABD 8X10 STRL (GAUZE/BANDAGES/DRESSINGS) ×3 IMPLANT
PAD ARMBOARD 7.5X6 YLW CONV (MISCELLANEOUS) ×6 IMPLANT
PENCIL BUTTON HOLSTER BLD 10FT (ELECTRODE) ×3 IMPLANT
SPONGE GAUZE 4X4 12PLY STER LF (GAUZE/BANDAGES/DRESSINGS) ×3 IMPLANT
SPONGE SURGIFOAM ABS GEL 12-7 (HEMOSTASIS) IMPLANT
SUT CHROMIC 2 0 SH (SUTURE) ×3 IMPLANT
SUT CHROMIC 3 0 SH 27 (SUTURE) IMPLANT
SUT ETHIBOND 0 (SUTURE) IMPLANT
SUT MON AB 3-0 SH 27 (SUTURE)
SUT MON AB 3-0 SH27 (SUTURE) IMPLANT
SUT SILK 2 0 (SUTURE) ×1
SUT SILK 2-0 18XBRD TIE 12 (SUTURE) ×2 IMPLANT
SUT SILK 3 0 SH 30 (SUTURE) ×3 IMPLANT
SUT VIC AB 3-0 PS2 18 (SUTURE)
SUT VIC AB 3-0 PS2 18XBRD (SUTURE) IMPLANT
SUT VIC AB 3-0 SH 18 (SUTURE) ×3 IMPLANT
SUT VIC AB 4-0 P-3 18XBRD (SUTURE) IMPLANT
SUT VIC AB 4-0 P3 18 (SUTURE)
SYR BULB IRRIGATION 50ML (SYRINGE) ×3 IMPLANT
SYR CONTROL 10ML LL (SYRINGE) ×3 IMPLANT
TOWEL OR 17X24 6PK STRL BLUE (TOWEL DISPOSABLE) ×3 IMPLANT
TRAY DSU PREP LF (CUSTOM PROCEDURE TRAY) ×3 IMPLANT
TUBE CONNECTING 12X1/4 (SUCTIONS) ×3 IMPLANT
YANKAUER SUCT BULB TIP NO VENT (SUCTIONS) ×3 IMPLANT

## 2014-10-30 NOTE — Anesthesia Procedure Notes (Signed)
Procedure Name: MAC Date/Time: 10/30/2014 7:35 AM Performed by: Wanita Chamberlain Pre-anesthesia Checklist: Patient identified, Timeout performed, Emergency Drugs available, Suction available and Patient being monitored Patient Re-evaluated:Patient Re-evaluated prior to inductionOxygen Delivery Method: Nasal cannula Intubation Type: IV induction Placement Confirmation: positive ETCO2 Dental Injury: Teeth and Oropharynx as per pre-operative assessment

## 2014-10-30 NOTE — Progress Notes (Signed)
Patient given a sitz bath along with an explanation on how to use it.  Patient ambulatory to car.  His wife drove hime home.  All questions were answered for the patient and his wife.

## 2014-10-30 NOTE — Interval H&P Note (Signed)
History and Physical Interval Note:  10/30/2014 7:19 AM  John Blake  has presented today for surgery, with the diagnosis of perianal fistula  The various methods of treatment have been discussed with the patient and family. After consideration of risks, benefits and other options for treatment, the patient has consented to  Procedure(s): ANAL EXAM UNDER ANESTHESIA WITH POSSIBLE FISTULOTOMY  (N/A) POSSIBLE PLACEMENT OF SETON (N/A) as a surgical intervention .  The patient's history has been reviewed, patient examined, no change in status, stable for surgery.  I have reviewed the patient's chart and labs.  Questions were answered to the patient's satisfaction.     Rosario Adie, MD  [facility]

## 2014-10-30 NOTE — Anesthesia Preprocedure Evaluation (Addendum)
Anesthesia Evaluation  Patient identified by MRN, date of birth, ID band Patient awake    Reviewed: Allergy & Precautions, NPO status , Patient's Chart, lab work & pertinent test results  Airway Mallampati: I  TM Distance: >3 FB Neck ROM: Full    Dental  (+) Teeth Intact, Dental Advisory Given,    Pulmonary Current Smoker,    breath sounds clear to auscultation       Cardiovascular hypertension, Pt. on medications  Rhythm:Regular Rate:Normal     Neuro/Psych negative neurological ROS     GI/Hepatic negative GI ROS, Neg liver ROS,   Endo/Other  diabetes, Well Controlled, Type 2, Oral Hypoglycemic Agents  Renal/GU negative Renal ROS     Musculoskeletal  (+) Arthritis ,   Abdominal   Peds  Hematology negative hematology ROS (+)   Anesthesia Other Findings   Reproductive/Obstetrics                         Anesthesia Physical Anesthesia Plan  ASA: II  Anesthesia Plan: MAC   Post-op Pain Management:    Induction: Intravenous  Airway Management Planned: Natural Airway, Simple Face Mask and Nasal Cannula  Additional Equipment:   Intra-op Plan:   Post-operative Plan:   Informed Consent: I have reviewed the patients History and Physical, chart, labs and discussed the procedure including the risks, benefits and alternatives for the proposed anesthesia with the patient or authorized representative who has indicated his/her understanding and acceptance.     Plan Discussed with: CRNA  Anesthesia Plan Comments:        Anesthesia Quick Evaluation

## 2014-10-30 NOTE — H&P (View-Only) (Signed)
John Blake 09/30/2014 3:51 PM Location: [facility] Patient #: [id number] DOB: [birth year] Married / Language: John Blake / Race: White Male History of Present Illness John Ruff MD; 99991111 4:02 PM) Patient words: reck.  The patient is a 69 year old male who presents with a perirectal abscess. 69 year old male approximately 8 weeks status post I&D of perirectal abscess. He presents to the office with continued purulent drainage. This is occasionally blood as well. Patient is an avid smoker. He denies any rectal pain or fevers. 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  Diagnostic Studies History John Ruff, MD; 99991111 4:05 PM) Colonoscopy 5-10 years ago  Allergies John Blake, John Blake; 09/30/2014 3:51 PM) No Known Drug Allergies 08/05/2014  Medication History (John Blake, CMA; 09/30/2014 3:51 PM) Atorvastatin Calcium (20MG  Tablet, Oral) Active. Ciprofloxacin HCl (500MG  Tablet, Oral) Active. Janumet (50-500MG  Tablet, Oral) Active. Valsartan-Hydrochlorothiazide (320-25MG  Tablet, Oral) Active. MetFORMIN HCl ER (750MG  Tablet ER 24HR, Oral) Active. Hydrocortisone Ace-Pramoxine (2.5-1% Cream, Rectal) Active. Norco (5-325MG  Tablet, Oral) Active. Medications Reconciled  Social History John Ruff, MD; 99991111 4:05 PM) Caffeine use Coffee. No drug use Alcohol use Moderate alcohol use. Tobacco use Current every day smoker.  Family History John Ruff, MD; 99991111 4:05 PM) Family history unknown First Degree  Relatives    Vitals ([place]; 09/30/2014 3:51 PM) 09/30/2014 3:51 PM Weight: 251 lb Height: 73in Body Surface Area: 2.42 m Body Mass Index: 33.12 kg/m Temp.: 88F(Temporal)  Pulse: 81 (Regular)  BP: 134/74 (Sitting, Left Arm, Standard)     Physical Exam John Ruff MD; 99991111 4:03 PM)  General Mental Status-Alert. General Appearance-Consistent with stated age. Hydration-Well hydrated. Voice-Normal.  Head and Neck Head-normocephalic, atraumatic with no lesions or palpable masses. Trachea-midline.  Eye Eyeball - Bilateral-Extraocular movements intact. Sclera/Conjunctiva - Bilateral-No scleral icterus.  Chest and Lung Exam Chest and lung exam reveals -quiet, even and easy respiratory effort with no use of accessory muscles and on auscultation, normal breath sounds, no adventitious sounds and normal vocal resonance. Inspection Chest Wall - Normal. Back - normal.  Cardiovascular Cardiovascular examination reveals -normal heart sounds, regular rate and rhythm with no murmurs and normal pedal pulses bilaterally.  Abdomen Inspection Inspection of the abdomen reveals - No Hernias. Palpation/Percussion Palpation and Percussion of the abdomen reveal - Soft, Non Tender, No Rebound tenderness, No Rigidity (guarding) and No hepatosplenomegaly. Auscultation Auscultation of the abdomen reveals - Bowel sounds normal.  Rectal Anorectal Exam External - Note: Left posterior external opening, fistula probe easily progresses towards midline posteriorly.  Neurologic Neurologic evaluation reveals -alert and oriented x 3 with no impairment of recent or remote memory. Mental Status-Normal.  Musculoskeletal Global Assessment -Note:no gross deformities.  Normal Exam - Left-Upper Extremity Strength Normal and Lower Extremity Strength Normal. Normal Exam - Right-Upper Extremity Strength Normal and Lower Extremity Strength  Normal.    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 I have recommended anal exam under anesthesia with possible fistulotomy versus seton placement. We discussed this in detail. We discussed the minimal risk of  incontinence with fistulotomy. We discussed the need for second surgery with seton placement.

## 2014-10-30 NOTE — Transfer of Care (Signed)
Immediate Anesthesia Transfer of Care Note  Patient: John Blake  Procedure(s) Performed: Procedure(s): ANAL EXAM UNDER ANESTHESIA WITH FISTULOTOMY  (N/A)  Patient Location: PACU  Anesthesia Type:MAC  Level of Consciousness: awake, alert , oriented and patient cooperative  Airway & Oxygen Therapy: Patient Spontanous Breathing and Patient connected to nasal cannula oxygen  Post-op Assessment: Report given to RN and Post -op Vital signs reviewed and stable  Post vital signs: Reviewed and stable  Last Vitals:  Filed Vitals:   10/30/14 0613  BP: 148/79  Pulse: 81  Temp: 36.9 C  Resp: 18    Complications: No apparent anesthesia complications

## 2014-10-30 NOTE — Op Note (Signed)
10/30/2014  7:54 AM  PATIENT:  John Blake  69 y.o. male  Patient Care Team: No Pcp Per Patient as PCP - General (General Practice)  PRE-OPERATIVE DIAGNOSIS:  perianal fistula  POST-OPERATIVE DIAGNOSIS:  perianal fistula  PROCEDURE:  ANAL EXAM UNDER ANESTHESIA WITH FISTULOTOMY    Surgeon(s): [name], MD  ASSISTANT: none   ANESTHESIA:   local and MAC  SPECIMEN:  No Specimen  DISPOSITION OF SPECIMEN:  N/A  COUNTS:  YES  PLAN OF CARE: Discharge to home after PACU  PATIENT DISPOSITION:  PACU - hemodynamically stable.  INDICATION: 69 y.o. M with perianal fistula after abscess.     OR FINDINGS: posterior midline fistula involving a small portion of external fistula.    DESCRIPTION: the patient was identified in the preoperative holding area and taken to the OR where they were laid on the operating room table.  MAC anesthesia was induced without difficulty. The patient was then positioned in prone jackknife position with buttocks gently taped apart.  The patient was then prepped and draped in usual sterile fashion.  SCDs were noted to be in place prior to the initiation of anesthesia. A surgical timeout was performed indicating the correct patient, procedure, positioning and need for preoperative antibiotics.  A rectal block was performed using Marcaine with epinephrine.    I began with a digital rectal exam.  A defect could be palpated posteriorly.  I then placed a Hill-Ferguson anoscope into the anal canal and evaluated this completely.  The patient had moderate external hemorrhoids and very little internal hemorrhoids.  His sphincter tone was excellent.  I used a fistula probe to identify the tract.  I used cautery to divide the skin and evaluate his muscle involvement.  The fistula appeared to involve a small portion of his external sphincter and no internal sphincter.  A complete fistulotomy was performed.  The edges were marsupialized using a 2-0 Chromic suture.   Hemostasis was good.  All counts were correct.  The patient was transferred to the PACU in stable condition.

## 2014-10-30 NOTE — Discharge Instructions (Addendum)

## 2014-10-30 NOTE — Anesthesia Postprocedure Evaluation (Signed)
  Anesthesia Post-op Note  Patient: John Blake  Procedure(s) Performed: Procedure(s): ANAL EXAM UNDER ANESTHESIA WITH FISTULOTOMY  (N/A)  Patient Location: PACU  Anesthesia Type:MAC  Level of Consciousness: awake and alert   Airway and Oxygen Therapy: Patient Spontanous Breathing  Post-op Pain: none  Post-op Assessment: Post-op Vital signs reviewed              Post-op Vital Signs: Reviewed  Last Vitals:  Filed Vitals:   10/30/14 0830  BP: 129/73  Pulse: 70  Temp:   Resp: 12    Complications: No apparent anesthesia complications

## 2014-11-02 ENCOUNTER — Encounter (HOSPITAL_BASED_OUTPATIENT_CLINIC_OR_DEPARTMENT_OTHER): Payer: Self-pay | Admitting: General Surgery

## 2014-11-22 ENCOUNTER — Other Ambulatory Visit: Payer: Self-pay | Admitting: Physician Assistant

## 2015-01-18 ENCOUNTER — Other Ambulatory Visit: Payer: Self-pay | Admitting: Physician Assistant

## 2015-01-18 ENCOUNTER — Ambulatory Visit (INDEPENDENT_AMBULATORY_CARE_PROVIDER_SITE_OTHER): Payer: BLUE CROSS/BLUE SHIELD | Admitting: Physician Assistant

## 2015-01-18 VITALS — BP 178/98 | HR 79 | Temp 98.6°F | Resp 20 | Ht 72.5 in | Wt 258.0 lb

## 2015-01-18 DIAGNOSIS — Z23 Encounter for immunization: Secondary | ICD-10-CM

## 2015-01-18 DIAGNOSIS — I10 Essential (primary) hypertension: Secondary | ICD-10-CM | POA: Diagnosis not present

## 2015-01-18 DIAGNOSIS — E119 Type 2 diabetes mellitus without complications: Secondary | ICD-10-CM | POA: Diagnosis not present

## 2015-01-18 DIAGNOSIS — Z1159 Encounter for screening for other viral diseases: Secondary | ICD-10-CM

## 2015-01-18 LAB — COMPREHENSIVE METABOLIC PANEL
ALK PHOS: 38 U/L — AB (ref 40–115)
ALT: 20 U/L (ref 9–46)
AST: 18 U/L (ref 10–35)
Albumin: 4.3 g/dL (ref 3.6–5.1)
BUN: 32 mg/dL — AB (ref 7–25)
CALCIUM: 9.5 mg/dL (ref 8.6–10.3)
CHLORIDE: 103 mmol/L (ref 98–110)
CO2: 21 mmol/L (ref 20–31)
Creat: 1.29 mg/dL — ABNORMAL HIGH (ref 0.70–1.25)
GLUCOSE: 119 mg/dL — AB (ref 65–99)
POTASSIUM: 4.2 mmol/L (ref 3.5–5.3)
Sodium: 140 mmol/L (ref 135–146)
TOTAL PROTEIN: 6.8 g/dL (ref 6.1–8.1)
Total Bilirubin: 0.7 mg/dL (ref 0.2–1.2)

## 2015-01-18 LAB — CBC WITH DIFFERENTIAL/PLATELET
BASOS PCT: 0 % (ref 0–1)
Basophils Absolute: 0 10*3/uL (ref 0.0–0.1)
Eosinophils Absolute: 0.4 10*3/uL (ref 0.0–0.7)
Eosinophils Relative: 5 % (ref 0–5)
HEMATOCRIT: 39.6 % (ref 39.0–52.0)
HEMOGLOBIN: 13.6 g/dL (ref 13.0–17.0)
LYMPHS PCT: 39 % (ref 12–46)
Lymphs Abs: 3.1 10*3/uL (ref 0.7–4.0)
MCH: 31.9 pg (ref 26.0–34.0)
MCHC: 34.3 g/dL (ref 30.0–36.0)
MCV: 92.7 fL (ref 78.0–100.0)
MPV: 10.3 fL (ref 8.6–12.4)
Monocytes Absolute: 0.6 10*3/uL (ref 0.1–1.0)
Monocytes Relative: 8 % (ref 3–12)
NEUTROS ABS: 3.8 10*3/uL (ref 1.7–7.7)
NEUTROS PCT: 48 % (ref 43–77)
Platelets: 200 10*3/uL (ref 150–400)
RBC: 4.27 MIL/uL (ref 4.22–5.81)
RDW: 13.6 % (ref 11.5–15.5)
WBC: 8 10*3/uL (ref 4.0–10.5)

## 2015-01-18 LAB — LIPID PANEL
CHOL/HDL RATIO: 1.9 ratio (ref <=5.0)
CHOLESTEROL: 88 mg/dL — AB (ref 125–200)
HDL: 47 mg/dL (ref >=40)
LDL Cholesterol: 26 mg/dL (ref <130)
TRIGLYCERIDES: 227 mg/dL — AB (ref <150)
VLDL: 45 mg/dL — ABNORMAL HIGH (ref <30)

## 2015-01-18 LAB — HEMOGLOBIN A1C
HEMOGLOBIN A1C: 6.4 % — AB (ref <5.7)
MEAN PLASMA GLUCOSE: 137 mg/dL — AB (ref <117)

## 2015-01-18 LAB — TSH: TSH: 3.863 u[IU]/mL (ref 0.350–4.500)

## 2015-01-18 MED ORDER — ATORVASTATIN CALCIUM 20 MG PO TABS: 20.0000 mg | ORAL_TABLET | Freq: Every morning | ORAL | Status: DC

## 2015-01-18 MED ORDER — VALSARTAN-HYDROCHLOROTHIAZIDE 320-25 MG PO TABS: 1.0000 | ORAL_TABLET | Freq: Every morning | ORAL | Status: DC

## 2015-01-18 MED ORDER — METFORMIN HCL ER 750 MG PO TB24: ORAL_TABLET | ORAL | Status: DC

## 2015-01-18 NOTE — Progress Notes (Signed)
Patient ID: John Blake, male    DOB: Jun 13, 1945, 70 y.o.   MRN: [id number]  PCP: No PCP Per Patient ([facility])  Subjective:   Chief Complaint  Patient presents with  . Medication Refill    metformin, atorvastatin, valsartan-HCTZ    HPI Presents for medication refills.  Last seen here in 06/2014.   Frequency of home glucose monitoring: not checking (needs test strips and lancets; uses One Touch Ultra) Sees a dentist Q6 months, eye specialist visit is long overdue. Checks feet daily. Is not current with influenza vaccine. Is not current with pneumococcal vaccine. Thinks he received 1 dose of Pneumovax.   Depression screen [facility] 2/9 01/18/2015 08/03/2014 08/01/2014 06/17/2014  Decreased Interest 1 0 0 1  Down, Depressed, Hopeless 1 0 0 1  PHQ - 2 Score 2 0 0 2  Altered sleeping 1 - - 1  Tired, decreased energy 3 - - 1  Change in appetite 0 - - 0  Feeling bad or failure about yourself  2 - - 1  Trouble concentrating 0 - - 0  Moving slowly or fidgety/restless 0 - - 0  Suicidal thoughts 0 - - 0  PHQ-9 Score 8 - - 5  Difficult doing work/chores Somewhat difficult - - Not difficult at all   His wife has been very sick, with pneumonia, requiring hospitalization x 3. He gest down when she is ill. He provides her care, does more of the housework.  Review of Systems  Constitutional: Positive for fatigue. Negative for fever and chills.  Eyes: Negative for visual disturbance (not what it used to be. Some days very watery, making it hard to read.).  Respiratory: Negative for shortness of breath (but not like it used to be).   Cardiovascular: Negative for chest pain.  Gastrointestinal: Negative for nausea, vomiting, diarrhea, constipation and blood in stool.  Genitourinary: Positive for urgency and frequency. Negative for dysuria and hematuria.  Musculoskeletal: Positive for arthralgias (shoulder, uses PRN NSAIDS).  Neurological: Negative for dizziness.    Psychiatric/Behavioral: Positive for dysphoric mood.       Patient Active Problem List   Diagnosis Date Noted  . Type 2 diabetes mellitus without complication ([place]) 0000000  . Shoulder impingement syndrome 06/17/2014     Prior to Admission medications   Medication Sig Start Date End Date Taking? Authorizing Provider  atorvastatin (LIPITOR) 20 MG tablet Take 20 mg by mouth every morning.   Yes Historical Provider, MD  metFORMIN (GLUCOPHAGE-XR) 750 MG 24 hr tablet TAKE 1 TABLET BY MOUTH TWICE A DAY BEFORE A MEAL  "OFFICE VISIT NEEDED" 11/22/14  Yes Kayveon Lennartz, PA-C  oxyCODONE (OXY IR/ROXICODONE) 5 MG immediate release tablet Take 1-2 tablets (5-10 mg total) by mouth every 4 (four) hours as needed. [id number]  No [name], MD  valsartan-hydrochlorothiazide (DIOVAN-HCT) 320-25 MG tablet Take 1 tablet by mouth every morning.   Yes Historical Provider, MD     No Known Allergies     Objective:  Physical Exam  Constitutional: He is oriented to person, place, and time. Vital signs are normal. He appears well-developed and well-nourished. He is active and cooperative. No distress.  BP 178/98 mmHg  Pulse 79  Temp(Src) 98.6 F (37 C) (Oral)  Resp 20  Ht 6' 0.5" (1.842 m)  Wt 258 lb (117.028 kg)  BMI 34.49 kg/m2  SpO2 98%  HENT:  Head: Normocephalic and atraumatic.  Right Ear: Hearing normal.  Left Ear: Hearing normal.  Eyes: Conjunctivae are normal.  No scleral icterus.  Neck: Normal range of motion, full passive range of motion without pain and phonation normal. Neck supple. No thyromegaly present.  Cardiovascular: Normal rate, regular rhythm and normal heart sounds.   Pulses:      Radial pulses are 2+ on the right side, and 2+ on the left side.  Pulmonary/Chest: Effort normal and breath sounds normal.  Lymphadenopathy:       Head (right side): No tonsillar, no preauricular, no posterior auricular and no occipital adenopathy present.       Head (left side): No  tonsillar, no preauricular, no posterior auricular and no occipital adenopathy present.    He has no cervical adenopathy.       Right: No supraclavicular adenopathy present.       Left: No supraclavicular adenopathy present.  Neurological: He is alert and oriented to person, place, and time. No sensory deficit.  Skin: Skin is warm, dry and intact. No rash noted. No cyanosis or erythema. Nails show no clubbing.  Psychiatric: He has a normal mood and affect. His speech is normal and behavior is normal.           Assessment & Plan:   1. Type 2 diabetes mellitus without complication, without long-term current use of insulin ([place]) Await labs. Adjust regimen if A1C >8%. - Comprehensive metabolic panel - Hemoglobin A1c - Lipid panel - Microalbumin, urine - metFORMIN (GLUCOPHAGE-XR) 750 MG 24 hr tablet; TAKE 1 TABLET BY MOUTH TWICE A DAY BEFORE A MEAL  Dispense: 180 tablet; Refill: 3 - atorvastatin (LIPITOR) 20 MG tablet; Take 1 tablet (20 mg total) by mouth every morning.  Dispense: 90 tablet; Refill: 3  2. Essential hypertension Above goal today, but out of medication. Resume DiovanHCT. Re-evaluate in 3 months, sooner if needed. - CBC with Differential/Platelet - TSH - valsartan-hydrochlorothiazide (DIOVAN-HCT) 320-25 MG tablet; Take 1 tablet by mouth every morning.  Dispense: 90 tablet; Refill: 3  3. Need for hepatitis C screening test - Hepatitis C antibody  4. Need for influenza vaccination - Flu Vaccine QUAD 36+ mos IM  5. Need for pneumococcal vaccination - Pneumococcal conjugate vaccine 13-valent IM   Request records from previous provider. Return in about 3 months (around 04/18/2015) for re-evaluation of diabetes.   Fara Chute, PA-C Physician Assistant-Certified [facility]

## 2015-01-18 NOTE — Patient Instructions (Signed)
I will contact you with your lab results as soon as they are available.   If you have not heard from me in 2 weeks, please contact me.  The fastest way to get your results is to register for My Chart (see the instructions on the last page of this printout).   

## 2015-01-18 NOTE — Progress Notes (Signed)
  Medical screening examination/treatment/procedure(s) were performed by non-physician practitioner and as supervising physician I was immediately available for consultation/collaboration.     

## 2015-01-19 LAB — MICROALBUMIN, URINE: Microalb, Ur: 0.7 mg/dL

## 2015-01-19 LAB — HEPATITIS C ANTIBODY: HCV Ab: NEGATIVE

## 2015-01-20 ENCOUNTER — Telehealth: Payer: Self-pay

## 2015-01-20 NOTE — Telephone Encounter (Signed)
John Blake,  I spoke to this patient today about his lab results and he had stated that he had forgot to remind you to give him a prescription for test strips and needles, and an inhaler says he forgot to mention he has been feeling some sob.

## 2015-01-21 MED ORDER — GLUCOSE BLOOD VI STRP
ORAL_STRIP | Status: DC
Start: 2015-01-21 — End: 2015-01-29

## 2015-01-21 MED ORDER — LANCETS MISC: Status: DC

## 2015-01-21 NOTE — Telephone Encounter (Signed)
Spoke with pt, advised message from [place]. Pt understood. 

## 2015-01-21 NOTE — Addendum Note (Signed)
Addended by: Fara Chute on: 01/21/2015 10:26 AM   Modules accepted: Orders

## 2015-01-21 NOTE — Telephone Encounter (Signed)
Meds ordered this encounter  Medications  . glucose blood test strip    Sig: Use as instructed    Dispense:  100 each    Refill:  12    Order Specific Question:  Supervising Provider    Answer:  DOOLITTLE, ROBERT P [id number]  . Lancets MISC    Sig: Use for home glucose monitoring    Dispense:  100 each    Refill:  3    Order Specific Question:  Supervising Provider    Answer:  DOOLITTLE, ROBERT P [id number]   Since we did not discuss his SOB at all when he was here, he needs to come in to discuss that. I'm not comfortable just sending in an inhaler without working to sort out WHY he's feeling SOB.

## 2015-01-29 ENCOUNTER — Ambulatory Visit (INDEPENDENT_AMBULATORY_CARE_PROVIDER_SITE_OTHER): Payer: BLUE CROSS/BLUE SHIELD | Admitting: Physician Assistant

## 2015-01-29 VITALS — BP 148/76 | HR 91 | Temp 98.3°F | Resp 20 | Ht 72.0 in | Wt 256.8 lb

## 2015-01-29 DIAGNOSIS — R0602 Shortness of breath: Secondary | ICD-10-CM

## 2015-01-29 DIAGNOSIS — J329 Chronic sinusitis, unspecified: Secondary | ICD-10-CM

## 2015-01-29 DIAGNOSIS — E119 Type 2 diabetes mellitus without complications: Secondary | ICD-10-CM | POA: Diagnosis not present

## 2015-01-29 DIAGNOSIS — J40 Bronchitis, not specified as acute or chronic: Secondary | ICD-10-CM | POA: Diagnosis not present

## 2015-01-29 MED ORDER — ONETOUCH LANCETS MISC: Status: DC

## 2015-01-29 MED ORDER — AMOXICILLIN-POT CLAVULANATE 875-125 MG PO TABS: 1.0000 | ORAL_TABLET | Freq: Two times a day (BID) | ORAL | Status: AC

## 2015-01-29 MED ORDER — GLUCOSE BLOOD VI STRP: ORAL_STRIP | Status: DC

## 2015-01-29 MED ORDER — BENZONATATE 100 MG PO CAPS: 100.0000 mg | ORAL_CAPSULE | Freq: Three times a day (TID) | ORAL | Status: DC | PRN

## 2015-01-29 MED ORDER — ALBUTEROL SULFATE HFA 108 (90 BASE) MCG/ACT IN AERS: 2.0000 | INHALATION_SPRAY | RESPIRATORY_TRACT | Status: DC | PRN

## 2015-01-29 NOTE — Progress Notes (Signed)
Patient ID: John Blake, male    DOB: 1945/05/14, 70 y.o.   MRN: [id number]  PCP: No PCP Per Patient  Subjective:   Chief Complaint  Patient presents with  . URI    x 4 days  . Cough    HPI Presents for evaluation of 4 days of cough and sinus congestion.  "Really congested. Getting rid of a lot of phlegm," yellow in color. A little blood tinge in the nasal mucous. "Mucinex helps get it out, but it's not getting rid of nothing.  I don't feel so bad, but I don't feel real good, either. It doesn't feel like the flu."  Occasional SOB. Comes in spells. Has used an albuterol inhaler previously with good relief. Requests a new inhaler.  No fever, chills, nausea, vomiting or diarrhea.  Review of Systems  Constitutional: Negative for fever, chills and fatigue.  HENT: Positive for congestion, nosebleeds, postnasal drip, rhinorrhea and sinus pressure. Negative for ear pain, facial swelling, sore throat, trouble swallowing and voice change.   Eyes: Negative for photophobia and visual disturbance.  Respiratory: Positive for cough and shortness of breath. Negative for chest tightness and wheezing.   Cardiovascular: Negative for chest pain and palpitations.  Gastrointestinal: Negative for nausea, vomiting and diarrhea.  Neurological: Negative for dizziness, light-headedness and headaches.       Patient Active Problem List   Diagnosis Date Noted  . Type 2 diabetes mellitus without complication ([place]) 0000000  . Shoulder impingement syndrome 06/17/2014     Prior to Admission medications   Medication Sig Start Date End Date Taking? Authorizing Provider  atorvastatin (LIPITOR) 20 MG tablet Take 1 tablet (20 mg total) by mouth every morning. 01/18/15  Yes Sharla Tankard, PA-C  glucose blood test strip Use as instructed 01/21/15  Yes Esha Fincher, PA-C  Lancets MISC Use for home glucose monitoring 01/21/15  Yes Sereniti Wan, PA-C  metFORMIN (GLUCOPHAGE-XR) 750 MG 24 hr tablet TAKE 1  TABLET BY MOUTH TWICE A DAY BEFORE A MEAL 01/18/15  Yes Ailea Rhatigan, PA-C  valsartan-hydrochlorothiazide (DIOVAN-HCT) 320-25 MG tablet Take 1 tablet by mouth every morning. 01/18/15  Yes Ellia Knowlton, PA-C  oxyCODONE (OXY IR/ROXICODONE) 5 MG immediate release tablet Take 1-2 tablets (5-10 mg total) by mouth every 4 (four) hours as needed. Patient not taking: Reported on [id number]   [name], MD     No Known Allergies     Objective:  Physical Exam  Constitutional: He is oriented to person, place, and time. Vital signs are normal. He appears well-developed and well-nourished. No distress.  BP 148/76 mmHg  Pulse 91  Temp(Src) 98.3 F (36.8 C) (Oral)  Resp 20  Ht 6' (1.829 m)  Wt 256 lb 12.8 oz (116.484 kg)  BMI 34.82 kg/m2  SpO2 96%   HENT:  Head: Normocephalic and atraumatic.  Right Ear: Hearing, tympanic membrane, external ear and ear canal normal.  Left Ear: Hearing, tympanic membrane, external ear and ear canal normal.  Nose: Mucosal edema and rhinorrhea present.  No foreign bodies. Right sinus exhibits no maxillary sinus tenderness and no frontal sinus tenderness. Left sinus exhibits no maxillary sinus tenderness and no frontal sinus tenderness.  Mouth/Throat: Uvula is midline, oropharynx is clear and moist and mucous membranes are normal. No uvula swelling. No oropharyngeal exudate.  Eyes: Conjunctivae and EOM are normal. Pupils are equal, round, and reactive to light. Right eye exhibits no discharge. Left eye exhibits no discharge. No scleral icterus.  Neck: Trachea normal, normal  range of motion and full passive range of motion without pain. Neck supple. No thyroid mass and no thyromegaly present.  Cardiovascular: Normal rate, regular rhythm and normal heart sounds.   Pulmonary/Chest: Effort normal. He has wheezes (occasional, soft, scattered).  Lymphadenopathy:       Head (right side): No submandibular, no tonsillar, no preauricular, no posterior auricular and  no occipital adenopathy present.       Head (left side): No submandibular, no tonsillar, no preauricular and no occipital adenopathy present.    He has no cervical adenopathy.       Right: No supraclavicular adenopathy present.       Left: No supraclavicular adenopathy present.  Neurological: He is alert and oriented to person, place, and time. He has normal strength. No cranial nerve deficit or sensory deficit.  Skin: Skin is warm, dry and intact. No rash noted.  Psychiatric: He has a normal mood and affect. His speech is normal and behavior is normal.           Assessment & Plan:   1. Sinobronchitis Supportive care.  Anticipatory guidance.  RTC if symptoms worsen/persist. - amoxicillin-clavulanate (AUGMENTIN) 875-125 MG tablet; Take 1 tablet by mouth 2 (two) times daily.  Dispense: 20 tablet; Refill: 0 - benzonatate (TESSALON) 100 MG capsule; Take 1-2 capsules (100-200 mg total) by mouth 3 (three) times daily as needed for cough.  Dispense: 40 capsule; Refill: 0  2. Type 2 diabetes mellitus without complication, without long-term current use of insulin (HCC) He needs some supplies. - glucose blood test strip; Use as instructed  Dispense: 100 each; Refill: 3 - ONE TOUCH LANCETS MISC; Use for home glucose monitoring  Dispense: 200 each; Refill: 1  3. SOB (shortness of breath) This is mild and intermittent, and has resolved previously with albuterol. Given the mild wheezing heard today, refill. Encouraged smoking cessation. - albuterol (PROVENTIL HFA;VENTOLIN HFA) 108 (90 Base) MCG/ACT inhaler; Inhale 2 puffs into the lungs every 4 (four) hours as needed for wheezing or shortness of breath (cough, shortness of breath or wheezing.).  Dispense: 1 Inhaler; Refill: 1   Fara Chute, PA-C Physician Assistant-Certified [facility]

## 2015-02-05 ENCOUNTER — Telehealth: Payer: Self-pay | Admitting: Physician Assistant

## 2015-02-05 DIAGNOSIS — E785 Hyperlipidemia, unspecified: Secondary | ICD-10-CM

## 2015-02-05 DIAGNOSIS — I10 Essential (primary) hypertension: Secondary | ICD-10-CM | POA: Insufficient documentation

## 2015-02-05 DIAGNOSIS — Z6834 Body mass index (BMI) 34.0-34.9, adult: Secondary | ICD-10-CM | POA: Insufficient documentation

## 2015-02-05 LAB — VITAMIN D 25 HYDROXY (VIT D DEFICIENCY, FRACTURES): VIT D 25 HYDROXY: 17.8

## 2015-02-05 NOTE — Telephone Encounter (Signed)
Spoke with medical records at [facility] to inquire about the vaccinations-need clarification on WHICH pneumococcal vaccine was given on 03/27/14 and on 11/07/12.  I was advised that the 23-valent was administered on both occasions, and that their office does not carry the 13-valent.

## 2015-06-14 ENCOUNTER — Other Ambulatory Visit: Payer: Self-pay | Admitting: Physician Assistant

## 2015-06-18 ENCOUNTER — Ambulatory Visit (INDEPENDENT_AMBULATORY_CARE_PROVIDER_SITE_OTHER): Payer: BLUE CROSS/BLUE SHIELD

## 2015-06-18 ENCOUNTER — Ambulatory Visit (INDEPENDENT_AMBULATORY_CARE_PROVIDER_SITE_OTHER): Payer: Medicare Other | Admitting: Emergency Medicine

## 2015-06-18 VITALS — BP 144/72 | HR 98 | Temp 99.7°F | Resp 16 | Ht 72.0 in | Wt 261.0 lb

## 2015-06-18 DIAGNOSIS — R059 Cough, unspecified: Secondary | ICD-10-CM

## 2015-06-18 DIAGNOSIS — E119 Type 2 diabetes mellitus without complications: Secondary | ICD-10-CM

## 2015-06-18 DIAGNOSIS — J208 Acute bronchitis due to other specified organisms: Secondary | ICD-10-CM | POA: Diagnosis not present

## 2015-06-18 DIAGNOSIS — R05 Cough: Secondary | ICD-10-CM | POA: Diagnosis not present

## 2015-06-18 DIAGNOSIS — R5383 Other fatigue: Secondary | ICD-10-CM

## 2015-06-18 DIAGNOSIS — Z720 Tobacco use: Secondary | ICD-10-CM

## 2015-06-18 DIAGNOSIS — R0602 Shortness of breath: Secondary | ICD-10-CM

## 2015-06-18 DIAGNOSIS — F17200 Nicotine dependence, unspecified, uncomplicated: Secondary | ICD-10-CM

## 2015-06-18 LAB — POCT CBC
GRANULOCYTE PERCENT: 58.9 % (ref 37–80)
HEMATOCRIT: 38 % — AB (ref 43.5–53.7)
HEMOGLOBIN: 13.4 g/dL — AB (ref 14.1–18.1)
LYMPH, POC: 2.3 (ref 0.6–3.4)
MCH: 33.6 pg — AB (ref 27–31.2)
MCHC: 35.4 g/dL (ref 31.8–35.4)
MCV: 94.8 fL (ref 80–97)
MID (cbc): 0.7 (ref 0–0.9)
MPV: 8.3 fL (ref 0–99.8)
POC GRANULOCYTE: 4.2 (ref 2–6.9)
POC LYMPH PERCENT: 31.5 %L (ref 10–50)
POC MID %: 9.6 % (ref 0–12)
Platelet Count, POC: 176 10*3/uL (ref 142–424)
RBC: 4 M/uL — AB (ref 4.69–6.13)
RDW, POC: 13.4 %
WBC: 7.2 10*3/uL (ref 4.6–10.2)

## 2015-06-18 LAB — GLUCOSE, POCT (MANUAL RESULT ENTRY): POC Glucose: 157 mg/dl — AB (ref 70–99)

## 2015-06-18 MED ORDER — ALBUTEROL SULFATE HFA 108 (90 BASE) MCG/ACT IN AERS: INHALATION_SPRAY | RESPIRATORY_TRACT | Status: DC

## 2015-06-18 MED ORDER — DOXYCYCLINE HYCLATE 100 MG PO TABS: 100.0000 mg | ORAL_TABLET | Freq: Two times a day (BID) | ORAL | Status: DC
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
## 2015-06-18 NOTE — Patient Instructions (Addendum)
I placed you on an antibiotic doxycycline to treat you for bronchitis because of  your smoking history and cough. I made a referral for you to be evaluated by cardiology. I made a referral for you to have a sleep study.    IF you received an x-ray today, you will receive an invoice from [facility]. Please contact [facility] at [phone] with questions or concerns regarding your invoice.   IF you received labwork today, you will receive an invoice from [employer]. Please contact [facility] at [phone] with questions or concerns regarding your invoice.   Our billing staff will not be able to assist you with questions regarding bills from these companies.  You will be contacted with the lab results as soon as they are available. The fastest way to get your results is to activate your My Chart account. Instructions are located on the last page of this paperwork. If you have not heard from [place] regarding the results in 2 weeks, please contact this office.

## 2015-06-26 ENCOUNTER — Telehealth: Payer: Self-pay

## 2015-06-26 DIAGNOSIS — E119 Type 2 diabetes mellitus without complications: Secondary | ICD-10-CM

## 2015-06-26 NOTE — Telephone Encounter (Signed)
Patient is requesting a refill for one touch test strips [facility] [address] [facility]

## 2015-06-28 MED ORDER — GLUCOSE BLOOD VI STRP: ORAL_STRIP | Status: DC

## 2015-06-28 NOTE — Telephone Encounter (Signed)
Sent in RFs and notified pt on VM.

## 2016-01-28 ENCOUNTER — Ambulatory Visit (INDEPENDENT_AMBULATORY_CARE_PROVIDER_SITE_OTHER): Payer: BLUE CROSS/BLUE SHIELD | Admitting: Family Medicine

## 2016-01-28 VITALS — BP 140/80 | HR 94 | Temp 98.3°F | Resp 18 | Ht 72.0 in | Wt 259.0 lb

## 2016-01-28 DIAGNOSIS — R05 Cough: Secondary | ICD-10-CM | POA: Diagnosis not present

## 2016-01-28 DIAGNOSIS — R059 Cough, unspecified: Secondary | ICD-10-CM

## 2016-01-28 DIAGNOSIS — Z720 Tobacco use: Secondary | ICD-10-CM | POA: Diagnosis not present

## 2016-01-28 DIAGNOSIS — F17200 Nicotine dependence, unspecified, uncomplicated: Secondary | ICD-10-CM | POA: Diagnosis not present

## 2016-01-28 DIAGNOSIS — J441 Chronic obstructive pulmonary disease with (acute) exacerbation: Secondary | ICD-10-CM | POA: Diagnosis not present

## 2016-01-28 DIAGNOSIS — Z716 Tobacco abuse counseling: Secondary | ICD-10-CM

## 2016-01-28 DIAGNOSIS — E119 Type 2 diabetes mellitus without complications: Secondary | ICD-10-CM | POA: Diagnosis not present

## 2016-01-28 MED ORDER — VARENICLINE TARTRATE 1 MG PO TABS: 1.0000 mg | ORAL_TABLET | Freq: Two times a day (BID) | ORAL | 6 refills | Status: DC

## 2016-01-28 MED ORDER — VARENICLINE TARTRATE 0.5 MG PO TABS: 0.5000 mg | ORAL_TABLET | Freq: Two times a day (BID) | ORAL | 0 refills | Status: DC

## 2016-01-28 MED ORDER — AZITHROMYCIN 250 MG PO TABS: ORAL_TABLET | ORAL | 0 refills | Status: DC

## 2016-01-28 MED ORDER — ALBUTEROL SULFATE HFA 108 (90 BASE) MCG/ACT IN AERS
2.0000 | INHALATION_SPRAY | Freq: Four times a day (QID) | RESPIRATORY_TRACT | 3 refills | Status: DC | PRN
Start: 2016-01-28 — End: 2016-03-17

## 2016-01-28 MED ORDER — BENZONATATE 100 MG PO CAPS: 100.0000 mg | ORAL_CAPSULE | Freq: Two times a day (BID) | ORAL | 0 refills | Status: DC | PRN

## 2016-01-28 MED ORDER — FLUTICASONE-SALMETEROL 250-50 MCG/DOSE IN AEPB: 1.0000 | INHALATION_SPRAY | Freq: Two times a day (BID) | RESPIRATORY_TRACT | 3 refills | Status: DC
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
## 2016-01-28 NOTE — Patient Instructions (Addendum)
IF you received an x-ray today, you will receive an invoice from [facility]. Please contact [facility] at [phone] with questions or concerns regarding your invoice.   IF you received labwork today, you will receive an invoice from [facility]. Please contact [facility] at [phone] with questions or concerns regarding your invoice.   Our billing staff will not be able to assist you with questions regarding bills from these companies.  You will be contacted with the lab results as soon as they are available. The fastest way to get your results is to activate your My Chart account. Instructions are located on the last page of this paperwork. If you have not heard from [place] regarding the results in 2 weeks, please contact this office.     Chronic Obstructive Pulmonary Disease Chronic obstructive pulmonary disease (COPD) is a common lung problem. In COPD, the flow of air from the lungs is limited. The way your lungs work will probably never return to normal, but there are things you can do to improve your lungs and make yourself feel better. Your doctor may treat your condition with:  Medicines.  Oxygen.  Lung surgery.  Changes to your diet.  Rehabilitation. This may involve a team of specialists. Follow these instructions at home:  Take all medicines as told by your doctor.  Avoid medicines or cough syrups that dry up your airway (such as antihistamines) and do not allow you to get rid of thick spit. You do not need to avoid them if told differently by your doctor.  If you smoke, stop. Smoking makes the problem worse.  Avoid being around things that make your breathing worse (like smoke, chemicals, and fumes).  Use oxygen therapy and therapy to help improve your lungs (pulmonary rehabilitation) if told by your doctor. If you need home oxygen therapy, ask your doctor if you should buy a tool to measure your oxygen level (oximeter).  Avoid people who have a  sickness you can catch (contagious).  Avoid going outside when it is very hot, cold, or humid.  Eat healthy foods. Eat smaller meals more often. Rest before meals.  Stay active, but remember to also rest.  Make sure to get all the shots (vaccines) your doctor recommends. Ask your doctor if you need a pneumonia shot.  Learn and use tips on how to relax.  Learn and use tips on how to control your breathing as told by your doctor. Try: 1. Breathing in (inhaling) through your nose for 1 second. Then, pucker your lips and breath out (exhale) through your lips for 2 seconds. 2. Putting one hand on your belly (abdomen). Breathe in slowly through your nose for 1 second. Your hand on your belly should move out. Pucker your lips and breathe out slowly through your lips. Your hand on your belly should move in as you breathe out.  Learn and use controlled coughing to clear thick spit from your lungs. The steps are: 1. Lean your head a little forward. 2. Breathe in deeply. 3. Try to hold your breath for 3 seconds. 4. Keep your mouth slightly open while coughing 2 times. 5. Spit any thick spit out into a tissue. 6. Rest and do the steps again 1 or 2 times as needed. Contact a doctor if:  You cough up more thick spit than usual.  There is a change in the color or thickness of the spit.  It is harder to breathe than usual.  Your breathing is faster  than usual. Get help right away if:  You have shortness of breath while resting.  You have shortness of breath that stops you from:  Being able to talk.  Doing normal activities.  You chest hurts for longer than 5 minutes.  Your skin color is more blue than usual.  Your pulse oximeter shows that you have low oxygen for longer than 5 minutes. This information is not intended to replace advice given to you by your health care provider. Make sure you discuss any questions you have with your health care provider. Document Released: 06/21/2007  Document Revised: 06/10/2015 Document Reviewed: 08/29/2012 Elsevier Interactive Patient Education  2017 [employer].

## 2016-02-12 ENCOUNTER — Ambulatory Visit (INDEPENDENT_AMBULATORY_CARE_PROVIDER_SITE_OTHER): Payer: BLUE CROSS/BLUE SHIELD

## 2016-02-12 ENCOUNTER — Ambulatory Visit (INDEPENDENT_AMBULATORY_CARE_PROVIDER_SITE_OTHER): Payer: BLUE CROSS/BLUE SHIELD | Admitting: Physician Assistant

## 2016-02-12 VITALS — BP 145/80 | HR 88 | Temp 98.8°F

## 2016-02-12 DIAGNOSIS — J441 Chronic obstructive pulmonary disease with (acute) exacerbation: Secondary | ICD-10-CM | POA: Diagnosis not present

## 2016-02-12 DIAGNOSIS — R05 Cough: Secondary | ICD-10-CM | POA: Diagnosis not present

## 2016-02-12 DIAGNOSIS — J310 Chronic rhinitis: Secondary | ICD-10-CM

## 2016-02-12 DIAGNOSIS — R059 Cough, unspecified: Secondary | ICD-10-CM

## 2016-02-12 DIAGNOSIS — I10 Essential (primary) hypertension: Secondary | ICD-10-CM | POA: Diagnosis not present

## 2016-02-12 DIAGNOSIS — E785 Hyperlipidemia, unspecified: Secondary | ICD-10-CM | POA: Diagnosis not present

## 2016-02-12 DIAGNOSIS — E119 Type 2 diabetes mellitus without complications: Secondary | ICD-10-CM

## 2016-02-12 MED ORDER — FLUTICASONE PROPIONATE 50 MCG/ACT NA SUSP: 2.0000 | Freq: Every day | NASAL | 12 refills | Status: DC

## 2016-02-12 NOTE — Progress Notes (Signed)
Patient ID: John Blake, male    DOB: May 18, 1945, 71 y.o.   MRN: [id number]  PCP: Harrison Mons, PA-C  Chief Complaint  Patient presents with  . Cough    has had for several weeks, not improving has taken ABX z pack  . Hypertension    elevated today     Subjective:   Presents for evaluation of cough.  He was seen here on 01/28/2016 for repiratory symptoms/COPD exacerbation and was prescribed azithromycin, Advair and albuterol. Advair too expensive, so he did not fill it. Has completed the azithromycin and reports needing the albuterol only about twice a week. SOB has resolved. Cough has improved, but still has a lot of phlegm in his throat.  The phlegm seems to collect in the back of his throat when he is lying down and triggers the cough.  No fever, chills, GI/GU symptoms. No HA, dizziness.  He has started Chantix and is motivated to quit smoking.  Overdue for diabetes follow-up.   Review of Systems As above.    Patient Active Problem List   Diagnosis Date Noted  . Benign essential HTN 02/05/2015  . Hyperlipidemia 02/05/2015  . BMI 34.0-34.9,adult 02/05/2015  . Type 2 diabetes mellitus without complication ([place]) 94/17/4081  . Shoulder impingement syndrome 06/17/2014     Prior to Admission medications   Medication Sig Start Date End Date Taking? Authorizing Provider  acetaminophen (TYLENOL) 325 MG tablet Take 325 mg by mouth 2 (two) times daily.   Yes Historical Provider, MD  albuterol (PROAIR HFA) 108 (90 Base) MCG/ACT inhaler Inhale 2 puffs into the lungs every 6 (six) hours as needed for wheezing or shortness of breath. 01/28/16  Yes Zoe A Nolon Rod, MD  atorvastatin (LIPITOR) 20 MG tablet Take 1 tablet (20 mg total) by mouth every morning. 01/18/15  Yes Lamarius Dirr, PA-C  benzonatate (TESSALON) 100 MG capsule Take 1 capsule (100 mg total) by mouth 2 (two) times daily as needed for cough. 01/28/16  Yes Forrest Moron, MD  diphenhydrAMINE (BENADRYL) 25 MG  tablet Take 25 mg by mouth every 6 (six) hours as needed.   Yes Historical Provider, MD  glucose blood test strip Use to test blood sugar once daily. Dx: E11.9 06/28/15  Yes [name], MD  ibuprofen (ADVIL,MOTRIN) 200 MG tablet Take 800 mg by mouth 2 (two) times daily as needed.   Yes Historical Provider, MD  Lancets MISC Use for home glucose monitoring 01/21/15  Yes Darnel Mchan, PA-C  metFORMIN (GLUCOPHAGE-XR) 750 MG 24 hr tablet TAKE 1 TABLET BY MOUTH TWICE A DAY BEFORE A MEAL 01/18/15  Yes Noris Kulinski, PA-C  ONE TOUCH LANCETS MISC Use for home glucose monitoring 01/29/15  Yes Tawonda Legaspi, PA-C  valsartan-hydrochlorothiazide (DIOVAN-HCT) 320-25 MG tablet Take 1 tablet by mouth every morning. 01/18/15  Yes Radie Berges, PA-C  varenicline (CHANTIX) 0.5 MG tablet Take 1 tablet (0.5 mg total) by mouth 2 (two) times daily. 01/28/16  Yes Forrest Moron, MD  varenicline (CHANTIX CONTINUING MONTH PAK) 1 MG tablet Take 1 tablet (1 mg total) by mouth 2 (two) times daily. Patient not taking: Reported on 02/12/2016 01/28/16   Forrest Moron, MD      No Known Allergies     Objective:  Physical Exam  Constitutional: He is oriented to person, place, and time. He appears well-developed and well-nourished. He is active and cooperative. No distress.  BP (!) 145/80   Pulse 88   Temp 98.8 F (  37.1 C)   SpO2 95%   HENT:  Head: Normocephalic and atraumatic.  Right Ear: Hearing normal.  Left Ear: Hearing normal.  Eyes: Conjunctivae are normal. No scleral icterus.  Neck: Normal range of motion. Neck supple. No thyromegaly present.  Cardiovascular: Normal rate, regular rhythm and normal heart sounds.   Pulses:      Radial pulses are 2+ on the right side, and 2+ on the left side.  Pulmonary/Chest: Effort normal and breath sounds normal.  Lymphadenopathy:       Head (right side): No tonsillar, no preauricular, no posterior auricular and no occipital adenopathy present.       Head (left side): No  tonsillar, no preauricular, no posterior auricular and no occipital adenopathy present.    He has no cervical adenopathy.       Right: No supraclavicular adenopathy present.       Left: No supraclavicular adenopathy present.  Neurological: He is alert and oriented to person, place, and time. No sensory deficit.  Skin: Skin is warm, dry and intact. No rash noted. No cyanosis or erythema. Nails show no clubbing.  Psychiatric: He has a normal mood and affect. His speech is normal and behavior is normal.    Dg Chest 2 View  Result Date: 02/12/2016 CLINICAL DATA:  Persistent cough EXAM: CHEST  2 VIEW COMPARISON:  06/18/2015 FINDINGS: Heart and mediastinal contours are within normal limits. No focal opacities or effusions. No acute bony abnormality. IMPRESSION: No active cardiopulmonary disease. Electronically Signed   By: Rolm Baptise M.D.   On: 02/12/2016 15:02       Assessment & Plan:   1. Cough 2. COPD exacerbation ([place]) Radiographs are reassuring. OTC Mucinex to thin mucous. Flonase, see below. - DG Chest 2 View; Future  3. Benign essential HTN Above goal today, possibly due to coughing and sleep disruption. If remains elevated at visit when well, will recommend adjustment in regimen.  4. Chronic rhinitis, unspecified type May contribute to his current cough. Start Flonase. - fluticasone (FLONASE) 50 MCG/ACT nasal spray; Place 2 sprays into both nostrils daily.  Dispense: 16 g; Refill: 12  5. Type 2 diabetes mellitus without complication, without long-term current use of insulin ([place]) 6. Hyperlipidemia, unspecified hyperlipidemia type Schedule a visit for re-evaluation and fasting labs.   Fara Chute, PA-C Physician Assistant-Certified [facility] at [place]

## 2016-02-12 NOTE — Patient Instructions (Addendum)
Instead of Benadryl, get Claritin (loratadine) for allergy symptoms. It's not sedating.  Use the albuterol inhaler as needed for cough, shortness of breath or wheezing.  Use the Mucinex to help thin the mucous. Use the Flonase to help open up the nasal and sinus passages.    IF you received an x-ray today, you will receive an invoice from [facility]. Please contact [facility] at [phone] with questions or concerns regarding your invoice.   IF you received labwork today, you will receive an invoice from [facility]. Please contact [facility] at [phone] with questions or concerns regarding your invoice.   Our billing staff will not be able to assist you with questions regarding bills from these companies.  You will be contacted with the lab results as soon as they are available. The fastest way to get your results is to activate your My Chart account. Instructions are located on the last page of this paperwork. If you have not heard from [place] regarding the results in 2 weeks, please contact this office.    Did you know that you begin to benefit from quitting smoking within the first twenty minutes? It's TRUE.  At 20 minutes: -blood pressure decreases -pulse rate drops -body temperature of hands and feet increases  At 8 hours: -carbon monoxide level in blood drops to normal -oxygen level in blood increases to normal  At 24 hours: -the chance of heart attack decreases  At 48 hours: -nerve endings start regrowing -ability to smell and taste is enhanced  2 weeks-3 months: -circulation improves -walking becomes easier -lung function improves  1-9 months: -coughing, sinus congestion, fatigue and shortness of breath decreases  1 year: -excess risk of heart disease is decreased to HALF that of a smoker  5 years: Stroke risk is reduced to that of people who have never smoked  10 years: -risk of lung cancer drops to as little as half that of continuing  smokers -risk of cancer of the mouth, throat, esophagus, bladder, kidney and pancreas decreases -risk of ulcer decreases  15 years -risk of heart disease is now similar to that of people who have never smoked -risk of death returns to nearly the level of people who have never smoked

## 2016-02-12 NOTE — Progress Notes (Signed)
Patient ID: John Blake, male    DOB: 09/10/1945, 71 y.o.   MRN: [id number]  PCP: Harrison Mons, PA-C  Chief Complaint  Patient presents with  . Cough    has had for several weeks, not improving has taken ABX z pack  . Hypertension    elevated today     Subjective:   Presents for evaluation of cough.  Pt is a 71yo caucasian male who presents for evaluation of persistent cough. He was seen in this office by Dr. Nolon Rod on 01/28/16. At that time, he was prescribed Azithromycin, Advair BID, and albuterol PRN. Pt states that he did not pick up the Advair because it was too expensive, but he has taken the azithromycin as directed and has used Albuterol PRN (only needed ~2 times/week). Pt states that his SOB and cough have improved, but he has persistent congestion and phlegm. The phlegm collects in the back of his throat as he sleeps and causes him to cough. Denies fever, chills, fatigue, weight changes, edema, hemoptysis, headache, chest pain, palpitations, abdominal pain, nausea, vomiting, diarrhea, or constipation.  Pt started Chantix 2 days ago and is motivated to quit smoking.  Review of Systems See HPI   Patient Active Problem List   Diagnosis Date Noted  . Benign essential HTN 02/05/2015  . Hyperlipidemia 02/05/2015  . BMI 34.0-34.9,adult 02/05/2015  . Type 2 diabetes mellitus without complication ([place]) 31/51/7616  . Shoulder impingement syndrome 06/17/2014     Prior to Admission medications   Medication Sig Start Date End Date Taking? Authorizing Provider  albuterol (PROAIR HFA) 108 (90 Base) MCG/ACT inhaler Inhale 2 puffs into the lungs every 6 (six) hours as needed for wheezing or shortness of breath. 01/28/16  Yes Zoe A Nolon Rod, MD  atorvastatin (LIPITOR) 20 MG tablet Take 1 tablet (20 mg total) by mouth every morning. 01/18/15  Yes Chelle Jeffery, PA-C  benzonatate (TESSALON) 100 MG capsule Take 1 capsule (100 mg total) by mouth 2 (two) times daily as needed for  cough. 01/28/16  Yes Zoe A Nolon Rod, MD  Fluticasone-Salmeterol (ADVAIR DISKUS) 250-50 MCG/DOSE AEPB Inhale 1 puff into the lungs 2 (two) times daily. 01/28/16  Yes Zoe A Stallings, MD  glucose blood test strip Use to test blood sugar once daily. Dx: E11.9 06/28/15  Yes [name], MD  Lancets MISC Use for home glucose monitoring 01/21/15  Yes Chelle Jeffery, PA-C  metFORMIN (GLUCOPHAGE-XR) 750 MG 24 hr tablet TAKE 1 TABLET BY MOUTH TWICE A DAY BEFORE A MEAL 01/18/15  Yes Chelle Jeffery, PA-C  ONE TOUCH LANCETS MISC Use for home glucose monitoring 01/29/15  Yes Chelle Jeffery, PA-C  oxyCODONE (OXY IR/ROXICODONE) 5 MG immediate release tablet Take 1-2 tablets (5-10 mg total) by mouth every 4 (four) hours as needed. 07/37/10  Yes [name], MD  valsartan-hydrochlorothiazide (DIOVAN-HCT) 320-25 MG tablet Take 1 tablet by mouth every morning. 01/18/15  Yes Chelle Jeffery, PA-C  varenicline (CHANTIX) 0.5 MG tablet Take 1 tablet (0.5 mg total) by mouth 2 (two) times daily. 01/28/16  Yes Forrest Moron, MD  varenicline (CHANTIX CONTINUING MONTH PAK) 1 MG tablet Take 1 tablet (1 mg total) by mouth 2 (two) times daily. Patient not taking: Reported on 02/12/2016 01/28/16   Forrest Moron, MD     No Known Allergies     Objective:  Physical Exam HEENT:  Pulm: Good respiratory effort. CTAB. No wheezes, rales, or rhonchi. CV: RRR. No M/R/G. Abd: Soft, Nontender.  Assessment & Plan:   1. Cough Pt advised to continue Mucinex and albuterol as needed; add Flonase to help open nasal and sinus passages. - DG Chest 2 View; Future  2. COPD exacerbation ([place])  3. Benign essential HTN  4. Chronic rhinitis, unspecified type Pt advised to use Claritin instead of Benadryl for allergy relief.  - fluticasone (FLONASE) 50 MCG/ACT nasal spray; Place 2 sprays into both nostrils daily.  Dispense: 16 g; Refill: 12  5. Type 2 diabetes mellitus without complication, without long-term current use of insulin  ([facility]) Pt advised to follow up within the next month for follow up and management of diabetes.  6. Hyperlipidemia, unspecified hyperlipidemia type  Lorella Nimrod, PA-S

## 2016-03-07 IMAGING — CR DG SHOULDER 2+V*L*
4 series · 4 of 4 positions shown · non-contrast
Comparison: None.

CLINICAL DATA: Left shoulder pain.

EXAM:
LEFT SHOULDER - 2+ VIEW

[AP]
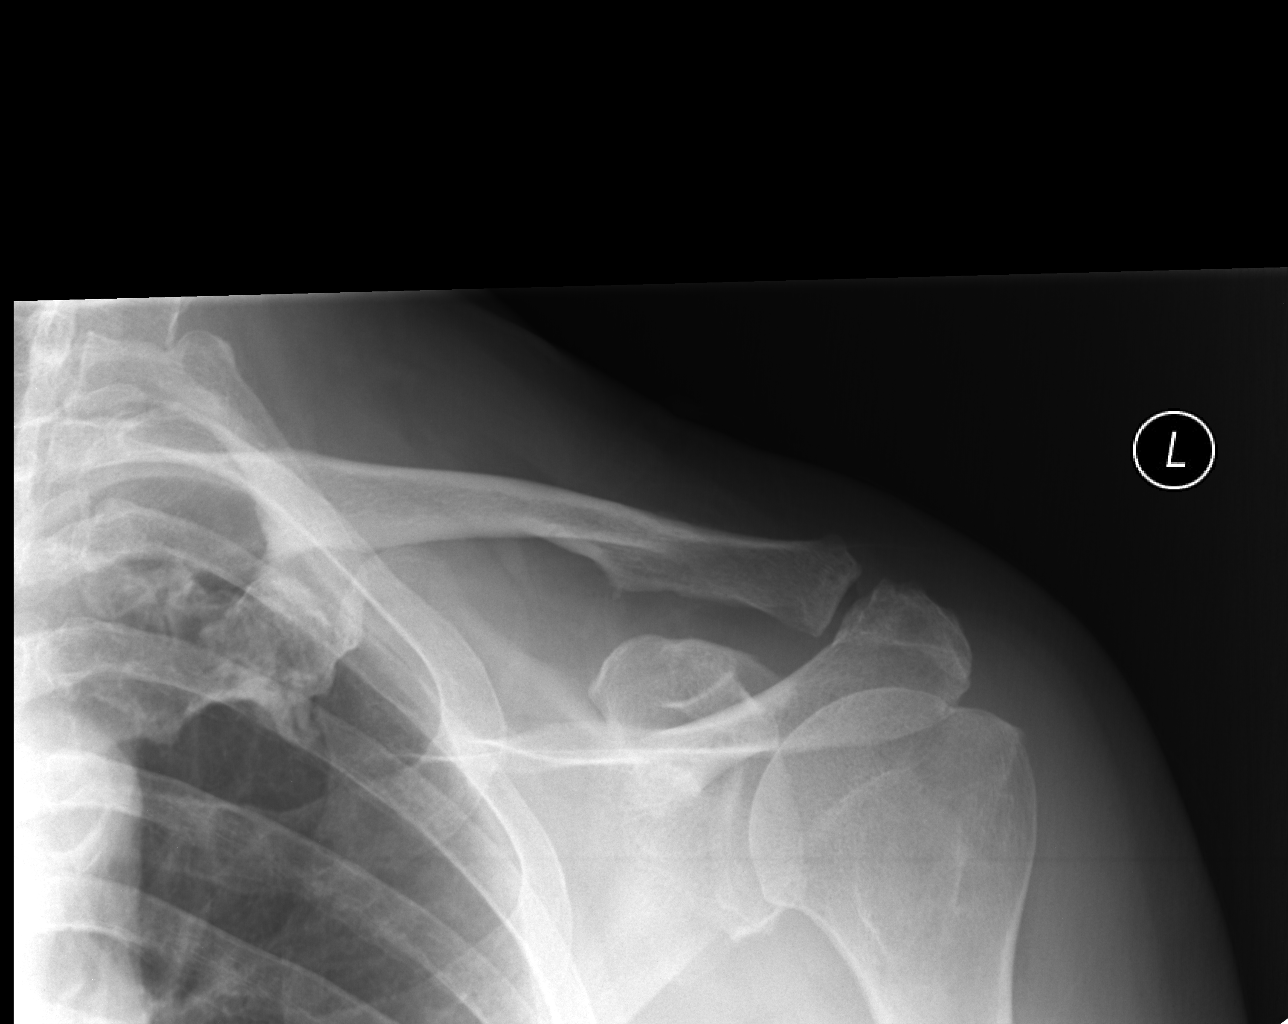

[ap ext rot]
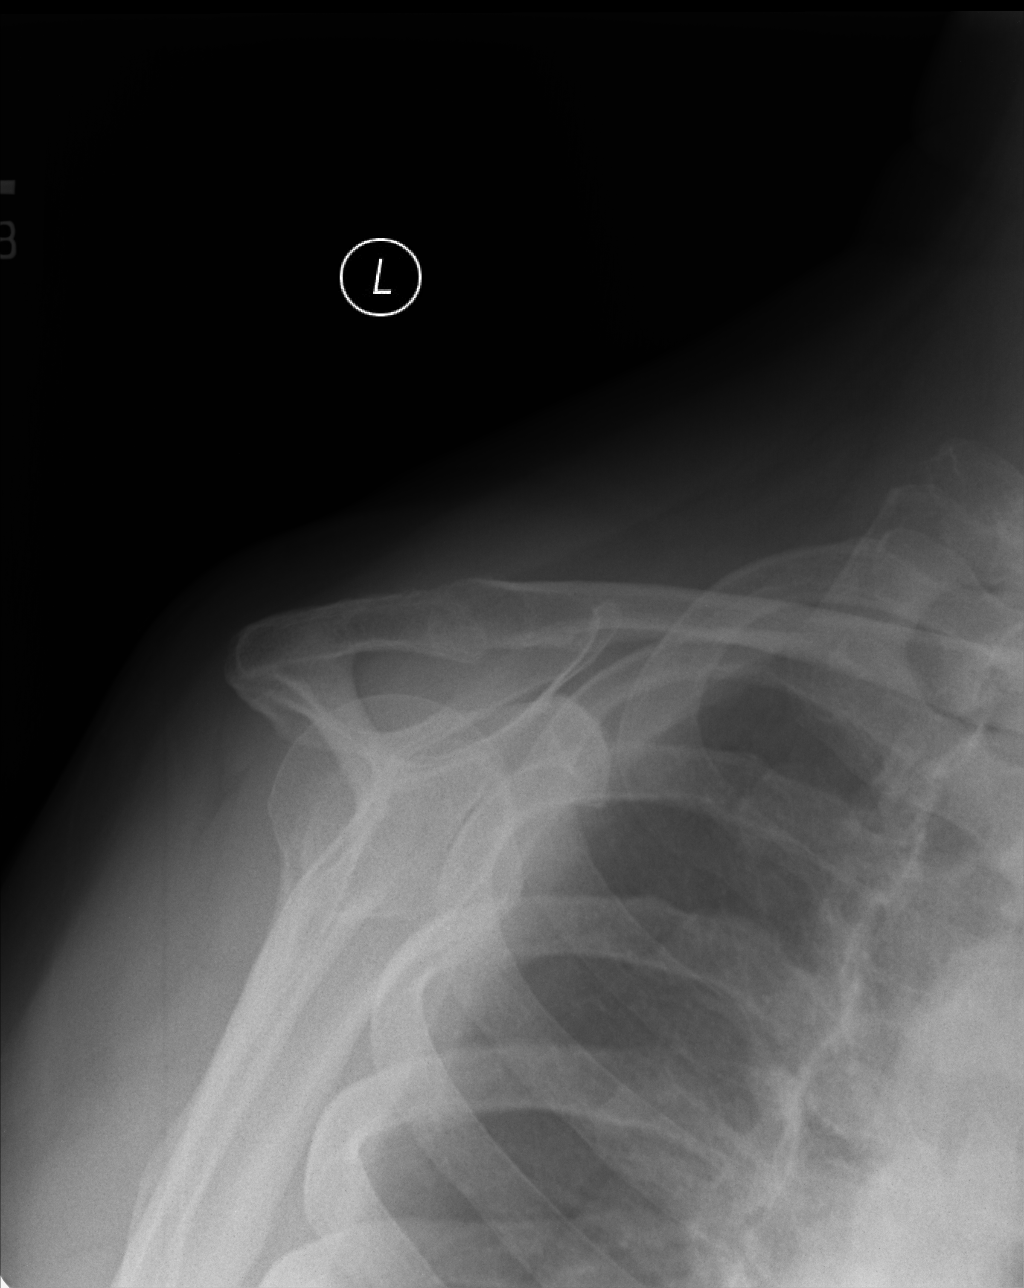

[ap int rot (1 of 2)]
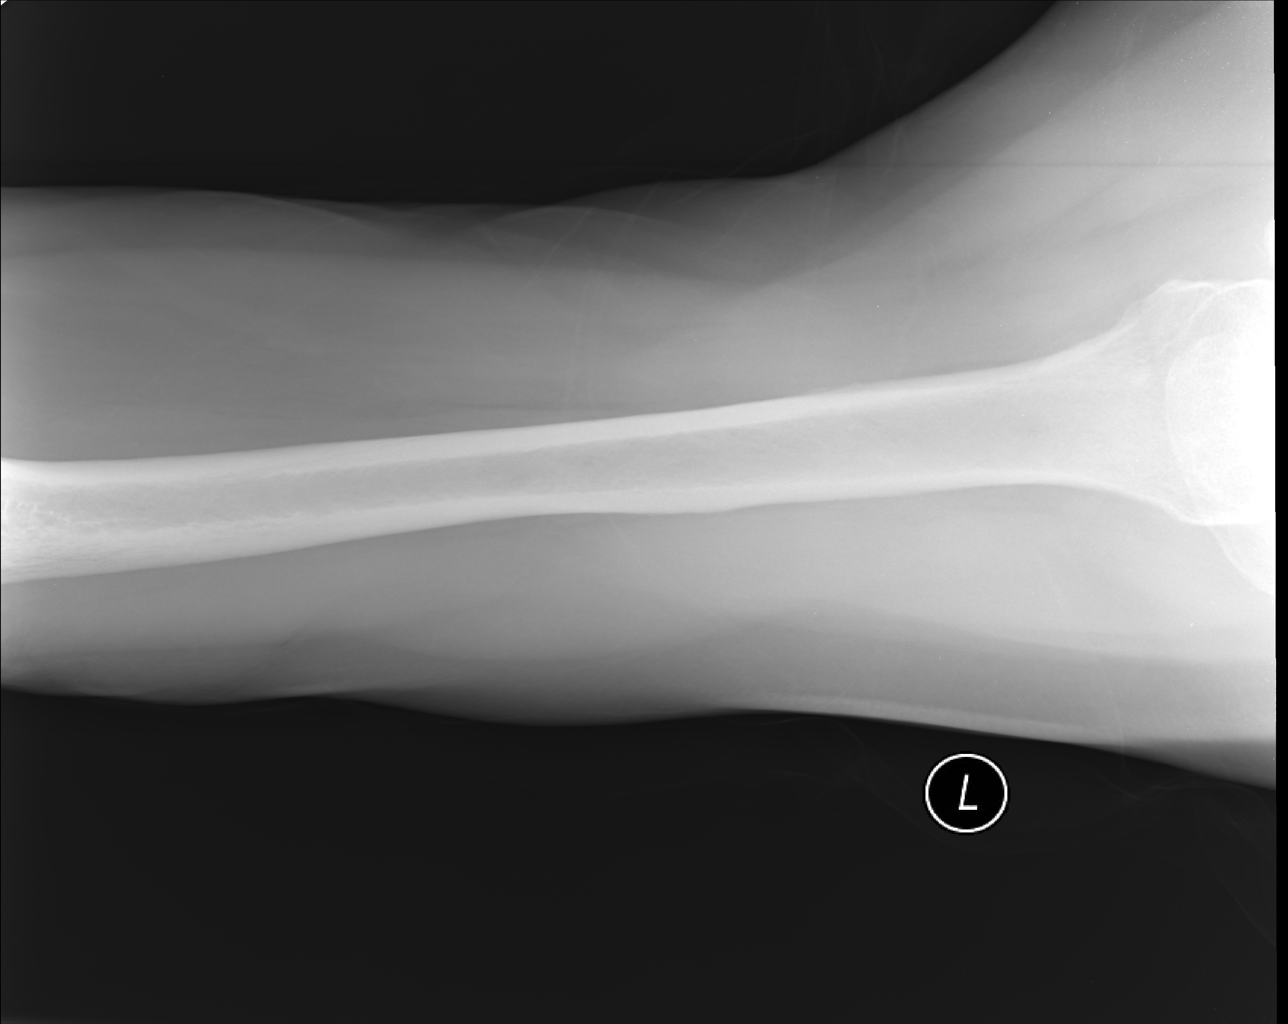

[ap int rot (2 of 2)]
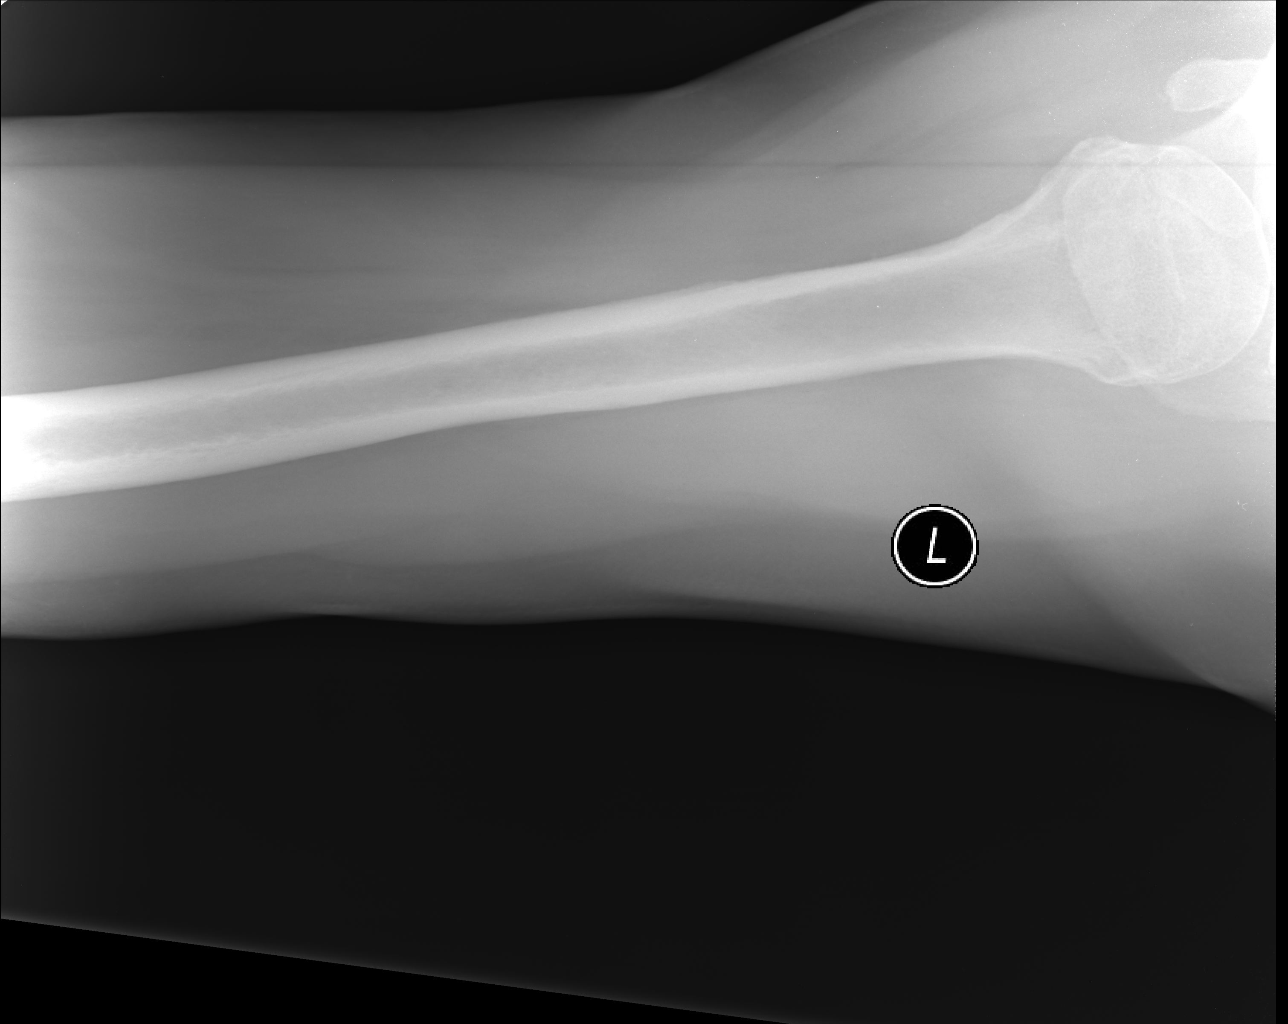

[4 of 4 positions shown; findings below may reference images not displayed]

FINDINGS: Axillary view is limited by positioning, the glenoid is excluded
from the field of view. No fracture or dislocation. The alignment is
maintained. Mild acromioclavicular and glenohumeral osteoarthritis.
No bony destructive change. No soft tissue calcifications.
IMPRESSION: Osteoarthritis.  No acute bony abnormality.

## 2016-03-14 ENCOUNTER — Other Ambulatory Visit: Payer: Self-pay | Admitting: Physician Assistant

## 2016-03-14 DIAGNOSIS — I10 Essential (primary) hypertension: Secondary | ICD-10-CM

## 2016-03-17 ENCOUNTER — Ambulatory Visit (INDEPENDENT_AMBULATORY_CARE_PROVIDER_SITE_OTHER): Payer: BLUE CROSS/BLUE SHIELD | Admitting: Family Medicine

## 2016-03-17 ENCOUNTER — Ambulatory Visit (INDEPENDENT_AMBULATORY_CARE_PROVIDER_SITE_OTHER): Payer: BLUE CROSS/BLUE SHIELD

## 2016-03-17 VITALS — BP 116/72 | HR 92 | Temp 98.6°F | Resp 16 | Ht 72.0 in | Wt 256.0 lb

## 2016-03-17 DIAGNOSIS — J209 Acute bronchitis, unspecified: Secondary | ICD-10-CM | POA: Diagnosis not present

## 2016-03-17 DIAGNOSIS — R062 Wheezing: Secondary | ICD-10-CM

## 2016-03-17 DIAGNOSIS — R059 Cough, unspecified: Secondary | ICD-10-CM

## 2016-03-17 DIAGNOSIS — J449 Chronic obstructive pulmonary disease, unspecified: Secondary | ICD-10-CM | POA: Diagnosis not present

## 2016-03-17 DIAGNOSIS — E782 Mixed hyperlipidemia: Secondary | ICD-10-CM | POA: Diagnosis not present

## 2016-03-17 DIAGNOSIS — J440 Chronic obstructive pulmonary disease with acute lower respiratory infection: Secondary | ICD-10-CM

## 2016-03-17 DIAGNOSIS — R05 Cough: Secondary | ICD-10-CM | POA: Diagnosis not present

## 2016-03-17 DIAGNOSIS — R0602 Shortness of breath: Secondary | ICD-10-CM | POA: Diagnosis not present

## 2016-03-17 DIAGNOSIS — E119 Type 2 diabetes mellitus without complications: Secondary | ICD-10-CM

## 2016-03-17 DIAGNOSIS — I10 Essential (primary) hypertension: Secondary | ICD-10-CM

## 2016-03-17 DIAGNOSIS — F17200 Nicotine dependence, unspecified, uncomplicated: Secondary | ICD-10-CM | POA: Diagnosis not present

## 2016-03-17 LAB — POCT GLYCOSYLATED HEMOGLOBIN (HGB A1C): HEMOGLOBIN A1C: 6.6

## 2016-03-17 MED ORDER — FLUTICASONE-SALMETEROL 250-50 MCG/DOSE IN AEPB: 1.0000 | INHALATION_SPRAY | Freq: Two times a day (BID) | RESPIRATORY_TRACT | 6 refills | Status: DC

## 2016-03-17 MED ORDER — ALBUTEROL SULFATE HFA 108 (90 BASE) MCG/ACT IN AERS: 2.0000 | INHALATION_SPRAY | Freq: Four times a day (QID) | RESPIRATORY_TRACT | 3 refills | Status: DC | PRN

## 2016-03-17 NOTE — Patient Instructions (Addendum)
   IF you received an x-ray today, you will receive an invoice from [facility]. Please contact [facility] at [phone] with questions or concerns regarding your invoice.   IF you received labwork today, you will receive an invoice from [facility]. Please contact [facility] at [phone] with questions or concerns regarding your invoice.   Our billing staff will not be able to assist you with questions regarding bills from these companies.  You will be contacted with the lab results as soon as they are available. The fastest way to get your results is to activate your My Chart account. Instructions are located on the last page of this paperwork. If you have not heard from us regarding the results in 2 weeks, please contact this office.      Chronic Obstructive Pulmonary Disease Chronic obstructive pulmonary disease (COPD) is a long-term (chronic) lung problem. When you have COPD, it is hard for air to get in and out of your lungs. The way your lungs work will never return to normal. Usually the condition gets worse over time. There are things you can do to keep yourself as healthy as possible. Your doctor may treat your condition with:  Medicines.  Quitting smoking, if you smoke.  Rehabilitation. This may involve a team of specialists.  Oxygen.  Exercise and changes to your diet.  Lung surgery.  Comfort measures (palliative care). Follow these instructions at home: Medicines   Take over-the-counter and prescription medicines only as told by your doctor.  Talk to your doctor before taking any cough or allergy medicines. You may need to avoid medicines that cause your lungs to be dry. Lifestyle   If you smoke, stop. Smoking makes the problem worse. If you need help quitting, ask your doctor.  Avoid being around things that make your breathing worse. This may include smoke, chemicals, and fumes.  Stay active, but remember to also rest.  Learn and use  tips on how to relax.  Make sure you get enough sleep. Most adults need at least 7 hours a night.  Eat healthy foods. Eat smaller meals more often. Rest before meals. Controlled breathing   Learn and use tips on how to control your breathing as told by your doctor. Try:  Breathing in (inhaling) through your nose for 1 second. Then, pucker your lips and breath out (exhale) through your lips for 2 seconds.  Putting one hand on your belly (abdomen). Breathe in slowly through your nose for 1 second. Your hand on your belly should move out. Pucker your lips and breathe out slowly through your lips. Your hand on your belly should move in as you breathe out. Controlled coughing   Learn and use controlled coughing to clear mucus from your lungs. The steps are: 1. Lean your head a little forward. 2. Breathe in deeply. 3. Try to hold your breath for 3 seconds. 4. Keep your mouth slightly open while coughing 2 times. 5. Spit any mucus out into a tissue. 6. Rest and do the steps again 1 or 2 times as needed. General instructions   Make sure you get all the shots (vaccines) that your doctor recommends. Ask your doctor about a flu shot and a pneumonia shot.  Use oxygen therapy and therapy to help improve your lungs (pulmonary rehabilitation) if told by your doctor. If you need home oxygen therapy, ask your doctor if you should buy a tool to measure your oxygen level (oximeter).  Make a COPD action plan with your   doctor. This helps you know what to do if you feel worse than usual.  Manage any other conditions you have as told by your doctor.  Avoid going outside when it is very hot, cold, or humid.  Avoid people who have a sickness you can catch (contagious).  Keep all follow-up visits as told by your doctor. This is important. Contact a doctor if:  You cough up more mucus than usual.  There is a change in the color or thickness of the mucus.  It is harder to breathe than usual.  Your  breathing is faster than usual.  You have trouble sleeping.  You need to use your medicines more often than usual.  You have trouble doing your normal activities such as getting dressed or walking around the house. Get help right away if:  You have shortness of breath while resting.  You have shortness of breath that stops you from:  Being able to talk.  Doing normal activities.  Your chest hurts for longer than 5 minutes.  Your skin color is more blue than usual.  Your pulse oximeter shows that you have low oxygen for longer than 5 minutes.  You have a fever.  You feel too tired to breathe normally. Summary  Chronic obstructive pulmonary disease (COPD) is a long-term lung problem.  The way your lungs work will never return to normal. Usually the condition gets worse over time. There are things you can do to keep yourself as healthy as possible.  Take over-the-counter and prescription medicines only as told by your doctor.  If you smoke, stop. Smoking makes the problem worse. This information is not intended to replace advice given to you by your health care provider. Make sure you discuss any questions you have with your health care provider. Document Released: 06/21/2007 Document Revised: 06/10/2015 Document Reviewed: 08/29/2012 Elsevier Interactive Patient Education  2017 Elsevier Inc.  
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
## 2016-03-18 LAB — COMPREHENSIVE METABOLIC PANEL
A/G RATIO: 2.2 (ref 1.2–2.2)
ALBUMIN: 4.8 g/dL (ref 3.6–4.8)
ALK PHOS: 45 IU/L (ref 39–117)
ALT: 22 IU/L (ref 0–44)
AST: 19 IU/L (ref 0–40)
BILIRUBIN TOTAL: 0.4 mg/dL (ref 0.0–1.2)
BUN / CREAT RATIO: 20 (ref 10–24)
BUN: 35 mg/dL — ABNORMAL HIGH (ref 8–27)
CHLORIDE: 101 mmol/L (ref 96–106)
CO2: 21 mmol/L (ref 18–29)
Calcium: 9.8 mg/dL (ref 8.6–10.2)
Creatinine, Ser: 1.71 mg/dL — ABNORMAL HIGH (ref 0.76–1.27)
GFR calc non Af Amer: 40 mL/min/{1.73_m2} — ABNORMAL LOW (ref >59)
GFR, EST AFRICAN AMERICAN: 47 mL/min/{1.73_m2} — AB (ref >59)
GLOBULIN, TOTAL: 2.2 g/dL (ref 1.5–4.5)
GLUCOSE: 115 mg/dL — AB (ref 65–99)
POTASSIUM: 4.1 mmol/L (ref 3.5–5.2)
SODIUM: 140 mmol/L (ref 134–144)
TOTAL PROTEIN: 7 g/dL (ref 6.0–8.5)

## 2016-03-18 LAB — LIPID PANEL
CHOL/HDL RATIO: 2 ratio (ref 0.0–5.0)
Cholesterol, Total: 96 mg/dL — ABNORMAL LOW (ref 100–199)
HDL: 47 mg/dL (ref >39)
LDL Calculated: 1 mg/dL (ref 0–99)
Triglycerides: 242 mg/dL — ABNORMAL HIGH (ref 0–149)
VLDL Cholesterol Cal: 48 mg/dL — ABNORMAL HIGH (ref 5–40)

## 2016-03-18 LAB — MICROALBUMIN, URINE: MICROALBUM., U, RANDOM: 12.9 ug/mL

## 2016-03-22 ENCOUNTER — Telehealth: Payer: Self-pay | Admitting: Family Medicine

## 2016-03-22 DIAGNOSIS — N179 Acute kidney failure, unspecified: Secondary | ICD-10-CM

## 2016-03-22 NOTE — Telephone Encounter (Signed)
Attempted to reach the patient on all available numbers.  Please notify the patient that he has shown a dramatic increase in his creatinine.  He should stop his metformin at this point.  His creatinine January 2017 was 1.29 and is now 1.71 (03/17/16).   Since the last visit the last a1c was very good.  Lab Results  Component Value Date   HGBA1C 6.6 03/17/2016   Let him know to drink plenty of water and return to clinic this Friday or Saturday to get recheck the creatinine.

## 2016-03-24 NOTE — Telephone Encounter (Signed)
Patient was called and given results.  He stated that he would return on tomorrow (Saturday) for his lab draw only.

## 2016-03-25 ENCOUNTER — Other Ambulatory Visit: Payer: Medicare Other

## 2016-03-25 DIAGNOSIS — N179 Acute kidney failure, unspecified: Secondary | ICD-10-CM | POA: Diagnosis not present

## 2016-03-25 DIAGNOSIS — N183 Chronic kidney disease, stage 3 unspecified: Secondary | ICD-10-CM

## 2016-03-27 LAB — BASIC METABOLIC PANEL
BUN / CREAT RATIO: 19 (ref 10–24)
BUN: 31 mg/dL — AB (ref 8–27)
CALCIUM: 9.5 mg/dL (ref 8.6–10.2)
CHLORIDE: 97 mmol/L (ref 96–106)
CO2: 20 mmol/L (ref 18–29)
CREATININE: 1.65 mg/dL — AB (ref 0.76–1.27)
GFR calc non Af Amer: 42 mL/min/{1.73_m2} — ABNORMAL LOW (ref >59)
GFR, EST AFRICAN AMERICAN: 49 mL/min/{1.73_m2} — AB (ref >59)
Glucose: 118 mg/dL — ABNORMAL HIGH (ref 65–99)
Potassium: 4.5 mmol/L (ref 3.5–5.2)
Sodium: 142 mmol/L (ref 134–144)

## 2016-03-31 ENCOUNTER — Telehealth: Payer: Self-pay | Admitting: Physician Assistant

## 2016-03-31 NOTE — Telephone Encounter (Addendum)
-----   Message from John Moron, MD sent at 03/30/2016  7:32 PM EDT ----- Please let the patient know that he has evidence of chronic kidney disease stage 3 based on his kidney function tests. I am going to refer him to Nephrology.  He should avoid NSAIDs.  At Nephrology they may change his blood pressure medications but for now he should continue his current bp medications.  LM for patient with information above.

## 2016-04-03 ENCOUNTER — Other Ambulatory Visit: Payer: Self-pay | Admitting: Physician Assistant

## 2016-04-03 DIAGNOSIS — E119 Type 2 diabetes mellitus without complications: Secondary | ICD-10-CM

## 2016-04-28 DIAGNOSIS — N179 Acute kidney failure, unspecified: Secondary | ICD-10-CM | POA: Diagnosis not present

## 2016-05-03 ENCOUNTER — Other Ambulatory Visit: Payer: Self-pay | Admitting: Nephrology

## 2016-05-03 DIAGNOSIS — N179 Acute kidney failure, unspecified: Secondary | ICD-10-CM

## 2016-05-12 ENCOUNTER — Other Ambulatory Visit: Payer: Medicare Other

## 2016-05-26 ENCOUNTER — Ambulatory Visit
Admission: RE | Admit: 2016-05-26 | Discharge: 2016-05-26 | Disposition: A | Discharge status: Home / Self Care | Payer: BLUE CROSS/BLUE SHIELD | Source: Ambulatory Visit | Attending: Nephrology | Admitting: Nephrology

## 2016-05-26 DIAGNOSIS — N179 Acute kidney failure, unspecified: Secondary | ICD-10-CM

## 2016-05-26 DIAGNOSIS — S37001A Unspecified injury of right kidney, initial encounter: Secondary | ICD-10-CM | POA: Diagnosis not present

## 2016-05-30 ENCOUNTER — Other Ambulatory Visit: Payer: Self-pay | Admitting: Family Medicine

## 2016-05-30 DIAGNOSIS — N183 Chronic kidney disease, stage 3 unspecified: Secondary | ICD-10-CM | POA: Insufficient documentation

## 2016-06-02 ENCOUNTER — Ambulatory Visit (INDEPENDENT_AMBULATORY_CARE_PROVIDER_SITE_OTHER): Payer: BLUE CROSS/BLUE SHIELD

## 2016-06-02 ENCOUNTER — Encounter: Payer: Self-pay | Admitting: Family Medicine

## 2016-06-02 ENCOUNTER — Ambulatory Visit (INDEPENDENT_AMBULATORY_CARE_PROVIDER_SITE_OTHER): Payer: BLUE CROSS/BLUE SHIELD | Admitting: Family Medicine

## 2016-06-02 VITALS — BP 122/70 | HR 102 | Temp 98.2°F | Resp 16 | Ht 72.5 in | Wt 242.2 lb

## 2016-06-02 DIAGNOSIS — R739 Hyperglycemia, unspecified: Secondary | ICD-10-CM | POA: Diagnosis not present

## 2016-06-02 DIAGNOSIS — J441 Chronic obstructive pulmonary disease with (acute) exacerbation: Secondary | ICD-10-CM

## 2016-06-02 DIAGNOSIS — E1165 Type 2 diabetes mellitus with hyperglycemia: Secondary | ICD-10-CM

## 2016-06-02 DIAGNOSIS — R634 Abnormal weight loss: Secondary | ICD-10-CM | POA: Diagnosis not present

## 2016-06-02 DIAGNOSIS — R05 Cough: Secondary | ICD-10-CM | POA: Diagnosis not present

## 2016-06-02 LAB — POCT GLYCOSYLATED HEMOGLOBIN (HGB A1C): Hemoglobin A1C: 6.3

## 2016-06-02 LAB — POCT CBC
Granulocyte percent: 62.2 %G (ref 37–80)
HCT, POC: 40.1 % — AB (ref 43.5–53.7)
HEMOGLOBIN: 13.8 g/dL — AB (ref 14.1–18.1)
Lymph, poc: 2.5 (ref 0.6–3.4)
MCH: 32.6 pg — AB (ref 27–31.2)
MCHC: 34.5 g/dL (ref 31.8–35.4)
MCV: 94.4 fL (ref 80–97)
MID (cbc): 0.5 (ref 0–0.9)
MPV: 8.1 fL (ref 0–99.8)
POC Granulocyte: 4.9 (ref 2–6.9)
POC LYMPH PERCENT: 31.9 %L (ref 10–50)
POC MID %: 5.9 % (ref 0–12)
Platelet Count, POC: 209 10*3/uL (ref 142–424)
RBC: 4.25 M/uL — AB (ref 4.69–6.13)
RDW, POC: 13.4 %
WBC: 7.8 10*3/uL (ref 4.6–10.2)

## 2016-06-02 MED ORDER — AZITHROMYCIN 250 MG PO TABS: ORAL_TABLET | ORAL | 0 refills | Status: DC

## 2016-06-02 MED ORDER — ALBUTEROL SULFATE (2.5 MG/3ML) 0.083% IN NEBU
2.5000 mg | INHALATION_SOLUTION | Freq: Once | RESPIRATORY_TRACT | Status: AC
  Administered 2016-06-02: 2.5 mg via RESPIRATORY_TRACT

## 2016-06-02 MED ORDER — IPRATROPIUM BROMIDE 0.02 % IN SOLN
0.5000 mg | Freq: Once | RESPIRATORY_TRACT | Status: AC
  Administered 2016-06-02: 0.5 mg via RESPIRATORY_TRACT

## 2016-06-02 MED ORDER — METHYLPREDNISOLONE SODIUM SUCC 125 MG IJ SOLR
125.0000 mg | Freq: Once | INTRAMUSCULAR | Status: AC
  Administered 2016-06-02: 125 mg via INTRAMUSCULAR

## 2016-06-02 MED ORDER — PREDNISONE 20 MG PO TABS: 40.0000 mg | ORAL_TABLET | Freq: Every day | ORAL | 0 refills | Status: AC

## 2016-06-02 NOTE — Patient Instructions (Addendum)
  Call your insurance and ask which they prefer or cover DULERA BREO SYMBICORT PULMICORT  Generics are cheaper Mail order is also cheaper   IF you received an x-ray today, you will receive an invoice from [facility]. Please contact [facility] at [phone] with questions or concerns regarding your invoice.   IF you received labwork today, you will receive an invoice from [facility]. Please contact [facility] at [phone] with questions or concerns regarding your invoice.   Our billing staff will not be able to assist you with questions regarding bills from these companies.  You will be contacted with the lab results as soon as they are available. The fastest way to get your results is to activate your My Chart account. Instructions are located on the last page of this paperwork. If you have not heard from [place] regarding the results in 2 weeks, please contact this office.
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
## 2016-06-05 ENCOUNTER — Telehealth: Payer: Self-pay | Admitting: Physician Assistant

## 2016-06-05 NOTE — Telephone Encounter (Signed)
Pt is calling to let Dr. Nolon Rod that  Patient insurance will cover Budesonide the hand held inhaler   Best number [phone]

## 2016-06-06 NOTE — Telephone Encounter (Signed)
Dr. Nolon Rod, Patient's wife called and is wondering if you can prescribe generic pulmicort because they can get it for $7 a month with a dx of COPD.

## 2016-06-07 ENCOUNTER — Other Ambulatory Visit: Payer: Self-pay | Admitting: Family Medicine

## 2016-06-07 ENCOUNTER — Telehealth: Payer: Self-pay | Admitting: Family Medicine

## 2016-06-07 MED ORDER — BUDESONIDE 0.5 MG/2ML IN SUSP: 0.5000 mg | Freq: Two times a day (BID) | RESPIRATORY_TRACT | 0 refills | Status: DC

## 2016-06-07 NOTE — Progress Notes (Signed)
PULMICORT started for COPD

## 2016-06-07 NOTE — Telephone Encounter (Signed)
Pulmicort generic sent in

## 2016-06-16 ENCOUNTER — Other Ambulatory Visit: Payer: Self-pay | Admitting: Physician Assistant

## 2016-06-16 DIAGNOSIS — I10 Essential (primary) hypertension: Secondary | ICD-10-CM

## 2016-06-21 ENCOUNTER — Other Ambulatory Visit: Payer: Self-pay | Admitting: Nephrology

## 2016-06-22 ENCOUNTER — Other Ambulatory Visit: Payer: Self-pay | Admitting: Nephrology

## 2016-06-22 DIAGNOSIS — N281 Cyst of kidney, acquired: Secondary | ICD-10-CM

## 2016-07-03 ENCOUNTER — Ambulatory Visit
Admission: RE | Admit: 2016-07-03 | Discharge: 2016-07-03 | Disposition: A | Discharge status: Home / Self Care | Payer: BLUE CROSS/BLUE SHIELD | Source: Ambulatory Visit | Attending: Nephrology | Admitting: Nephrology

## 2016-07-03 DIAGNOSIS — N2889 Other specified disorders of kidney and ureter: Secondary | ICD-10-CM | POA: Diagnosis not present

## 2016-07-03 DIAGNOSIS — N281 Cyst of kidney, acquired: Secondary | ICD-10-CM

## 2016-07-06 ENCOUNTER — Telehealth: Payer: Self-pay

## 2016-07-06 NOTE — Telephone Encounter (Signed)
Called pt to schedule Medicare Annual Wellness Visit. -nr  

## 2016-07-14 ENCOUNTER — Encounter: Payer: Self-pay | Admitting: Pulmonary Disease

## 2016-07-14 ENCOUNTER — Ambulatory Visit (INDEPENDENT_AMBULATORY_CARE_PROVIDER_SITE_OTHER): Payer: BLUE CROSS/BLUE SHIELD | Admitting: Pulmonary Disease

## 2016-07-14 ENCOUNTER — Other Ambulatory Visit (INDEPENDENT_AMBULATORY_CARE_PROVIDER_SITE_OTHER): Payer: BLUE CROSS/BLUE SHIELD

## 2016-07-14 DIAGNOSIS — J449 Chronic obstructive pulmonary disease, unspecified: Secondary | ICD-10-CM

## 2016-07-14 DIAGNOSIS — F17200 Nicotine dependence, unspecified, uncomplicated: Secondary | ICD-10-CM | POA: Insufficient documentation

## 2016-07-14 DIAGNOSIS — J302 Other seasonal allergic rhinitis: Secondary | ICD-10-CM

## 2016-07-14 LAB — CBC WITH DIFFERENTIAL/PLATELET
BASOS ABS: 0 10*3/uL (ref 0.0–0.1)
Basophils Relative: 0.5 % (ref 0.0–3.0)
EOS ABS: 0.2 10*3/uL (ref 0.0–0.7)
EOS PCT: 2.5 % (ref 0.0–5.0)
HCT: 36.5 % — ABNORMAL LOW (ref 39.0–52.0)
HEMOGLOBIN: 12.6 g/dL — AB (ref 13.0–17.0)
LYMPHS ABS: 2.3 10*3/uL (ref 0.7–4.0)
Lymphocytes Relative: 26.5 % (ref 12.0–46.0)
MCHC: 34.5 g/dL (ref 30.0–36.0)
MCV: 97.8 fl (ref 78.0–100.0)
MONO ABS: 0.7 10*3/uL (ref 0.1–1.0)
Monocytes Relative: 8 % (ref 3.0–12.0)
NEUTROS PCT: 62.5 % (ref 43.0–77.0)
Neutro Abs: 5.3 10*3/uL (ref 1.4–7.7)
Platelets: 233 10*3/uL (ref 150.0–400.0)
RBC: 3.73 Mil/uL — AB (ref 4.22–5.81)
RDW: 13.8 % (ref 11.5–15.5)
WBC: 8.5 10*3/uL (ref 4.0–10.5)

## 2016-07-14 NOTE — Patient Instructions (Signed)
   Continue using your Advair and Albuterol inhaler as prescribed.  We are stopping the Pulmicort/Budesonide today.  We will review your test results at your next appointment.  Call/e-mail me if you have any questions or concerns before your next appointment.  TESTS ORDERED: 1. Full PFTs on or before next appointment 2. Serum CBC with differential, RAST Panel, & alpha-1 antitrypsin phenotype today
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
## 2016-07-17 LAB — RESPIRATORY ALLERGY PROFILE REGION II ~~LOC~~
Allergen, A. alternata, m6: <0.1 kU/L
Allergen, C. Herbarum, M2: <0.1 kU/L
Allergen, Cottonwood, t14: <0.1 kU/L
Allergen, D pternoyssinus,d7: <0.1 kU/L
Allergen, Mulberry, t76: <0.1 kU/L
Allergen, P. notatum, m1: <0.1 kU/L
Aspergillus fumigatus, m3: <0.1 kU/L
Bermuda Grass: <0.1 kU/L
Common Ragweed: <0.1 kU/L
D. farinae: <0.1 kU/L
IGE (IMMUNOGLOBULIN E), SERUM: 56 kU/L (ref <115)
Johnson Grass: <0.1 kU/L
Pecan/Hickory Tree IgE: <0.1 kU/L
Rough Pigweed  IgE: <0.1 kU/L
Timothy Grass: <0.1 kU/L

## 2016-07-19 LAB — ALPHA-1 ANTITRYPSIN PHENOTYPE: A-1 Antitrypsin: 124 mg/dL (ref 83–199)

## 2016-07-21 ENCOUNTER — Ambulatory Visit (INDEPENDENT_AMBULATORY_CARE_PROVIDER_SITE_OTHER): Payer: Medicare Other | Admitting: Physician Assistant

## 2016-07-21 DIAGNOSIS — E119 Type 2 diabetes mellitus without complications: Secondary | ICD-10-CM

## 2016-07-21 DIAGNOSIS — F17200 Nicotine dependence, unspecified, uncomplicated: Secondary | ICD-10-CM

## 2016-07-21 DIAGNOSIS — E782 Mixed hyperlipidemia: Secondary | ICD-10-CM | POA: Diagnosis not present

## 2016-07-21 DIAGNOSIS — I10 Essential (primary) hypertension: Secondary | ICD-10-CM | POA: Diagnosis not present

## 2016-07-21 DIAGNOSIS — N183 Chronic kidney disease, stage 3 unspecified: Secondary | ICD-10-CM

## 2016-07-21 DIAGNOSIS — Z125 Encounter for screening for malignant neoplasm of prostate: Secondary | ICD-10-CM

## 2016-07-21 DIAGNOSIS — J449 Chronic obstructive pulmonary disease, unspecified: Secondary | ICD-10-CM

## 2016-07-21 DIAGNOSIS — Z1211 Encounter for screening for malignant neoplasm of colon: Secondary | ICD-10-CM

## 2016-07-21 NOTE — Progress Notes (Signed)
Orders Placed This Encounter  Procedures  . CBC  . Comprehensive metabolic panel    Order Specific Question:   Has the patient fasted?    Answer:   No  . Lipid panel    Order Specific Question:   Has the patient fasted?    Answer:   No  . Ambulatory referral to Gastroenterology    Referral Priority:   Routine    Referral Type:   Consultation    Referral Reason:   Specialty Services Required    Number of Visits Requested:   1  . [facility] DIABETES EYE EXAM    Order Specific Question:   Retinal Imaging Scan?    Answer:   Yes  . HM DIABETES FOOT EXAM    Patient was adamant that he did not need an office visit, only the lab draw, and left before I could see him. As such, he did not sign the ABN for the PSA test, for prostate cancer screening, and therefore the test was cancelled.  I am listed as his PCP, but note that his most recent visits are with Dr. Nolon Rod. When I notify him of his lab results, I will clarify with him who he intends to follow-up with for future visits.

## 2016-07-22 LAB — COMPREHENSIVE METABOLIC PANEL
ALBUMIN: 4.4 g/dL (ref 3.6–4.8)
ALT: 20 IU/L (ref 0–44)
AST: 17 IU/L (ref 0–40)
Albumin/Globulin Ratio: 2 (ref 1.2–2.2)
Alkaline Phosphatase: 45 IU/L (ref 39–117)
BILIRUBIN TOTAL: 0.5 mg/dL (ref 0.0–1.2)
BUN / CREAT RATIO: 22 (ref 10–24)
BUN: 39 mg/dL — AB (ref 8–27)
CALCIUM: 9.5 mg/dL (ref 8.6–10.2)
CHLORIDE: 104 mmol/L (ref 96–106)
CO2: 19 mmol/L — ABNORMAL LOW (ref 20–29)
CREATININE: 1.8 mg/dL — AB (ref 0.76–1.27)
GFR, EST AFRICAN AMERICAN: 44 mL/min/{1.73_m2} — AB (ref >59)
GFR, EST NON AFRICAN AMERICAN: 38 mL/min/{1.73_m2} — AB (ref >59)
GLUCOSE: 109 mg/dL — AB (ref 65–99)
Globulin, Total: 2.2 g/dL (ref 1.5–4.5)
Potassium: 5.3 mmol/L — ABNORMAL HIGH (ref 3.5–5.2)
Sodium: 140 mmol/L (ref 134–144)
TOTAL PROTEIN: 6.6 g/dL (ref 6.0–8.5)

## 2016-07-22 LAB — LIPID PANEL
CHOL/HDL RATIO: 2.1 ratio (ref 0.0–5.0)
Cholesterol, Total: 108 mg/dL (ref 100–199)
HDL: 52 mg/dL (ref >39)
LDL CALC: 0 mg/dL (ref 0–99)
Triglycerides: 278 mg/dL — ABNORMAL HIGH (ref 0–149)
VLDL CHOLESTEROL CAL: 56 mg/dL — AB (ref 5–40)

## 2016-07-22 LAB — CBC
HEMOGLOBIN: 12 g/dL — AB (ref 13.0–17.7)
Hematocrit: 35.8 % — ABNORMAL LOW (ref 37.5–51.0)
MCH: 32.9 pg (ref 26.6–33.0)
MCHC: 33.5 g/dL (ref 31.5–35.7)
MCV: 98 fL — ABNORMAL HIGH (ref 79–97)
Platelets: 239 10*3/uL (ref 150–379)
RBC: 3.65 x10E6/uL — AB (ref 4.14–5.80)
RDW: 14.5 % (ref 12.3–15.4)
WBC: 7.6 10*3/uL (ref 3.4–10.8)

## 2016-07-24 ENCOUNTER — Encounter: Payer: Self-pay | Admitting: Physician Assistant

## 2016-07-25 ENCOUNTER — Encounter: Payer: Self-pay | Admitting: Gastroenterology

## 2016-07-28 DIAGNOSIS — D472 Monoclonal gammopathy: Secondary | ICD-10-CM | POA: Diagnosis not present

## 2016-07-28 DIAGNOSIS — I10 Essential (primary) hypertension: Secondary | ICD-10-CM | POA: Diagnosis not present

## 2016-07-28 DIAGNOSIS — N281 Cyst of kidney, acquired: Secondary | ICD-10-CM | POA: Diagnosis not present

## 2016-07-28 DIAGNOSIS — N183 Chronic kidney disease, stage 3 (moderate): Secondary | ICD-10-CM | POA: Diagnosis not present

## 2016-08-02 ENCOUNTER — Encounter: Payer: Self-pay | Admitting: Family Medicine

## 2016-08-02 DIAGNOSIS — N281 Cyst of kidney, acquired: Secondary | ICD-10-CM | POA: Insufficient documentation

## 2016-08-02 DIAGNOSIS — D472 Monoclonal gammopathy: Secondary | ICD-10-CM | POA: Insufficient documentation

## 2016-09-08 ENCOUNTER — Ambulatory Visit (INDEPENDENT_AMBULATORY_CARE_PROVIDER_SITE_OTHER): Payer: BLUE CROSS/BLUE SHIELD | Admitting: Pulmonary Disease

## 2016-09-08 DIAGNOSIS — J449 Chronic obstructive pulmonary disease, unspecified: Secondary | ICD-10-CM

## 2016-09-08 LAB — PULMONARY FUNCTION TEST
DL/VA % pred: 87 %
DL/VA: 4.19 ml/min/mmHg/L
DLCO COR: 22.52 ml/min/mmHg
DLCO UNC % PRED: 57 %
DLCO UNC: 21.13 ml/min/mmHg
DLCO cor % pred: 60 %
FEF 25-75 PRE: 1.45 L/s
FEF 25-75 Post: 1.81 L/sec
FEF2575-%Change-Post: 25 %
FEF2575-%PRED-PRE: 50 %
FEF2575-%Pred-Post: 63 %
FEV1-%Change-Post: 3 %
FEV1-%PRED-PRE: 62 %
FEV1-%Pred-Post: 64 %
FEV1-POST: 2.41 L
FEV1-Pre: 2.33 L
FEV1FVC-%CHANGE-POST: 0 %
FEV1FVC-%Pred-Pre: 96 %
FEV6-%CHANGE-POST: 4 %
FEV6-%PRED-POST: 69 %
FEV6-%PRED-PRE: 66 %
FEV6-POST: 3.37 L
FEV6-Pre: 3.22 L
FEV6FVC-%CHANGE-POST: 0 %
FEV6FVC-%PRED-POST: 104 %
FEV6FVC-%Pred-Pre: 103 %
FVC-%Change-Post: 3 %
FVC-%Pred-Post: 66 %
FVC-%Pred-Pre: 64 %
FVC-Post: 3.39 L
FVC-Pre: 3.27 L
POST FEV6/FVC RATIO: 99 %
PRE FEV1/FVC RATIO: 71 %
Post FEV1/FVC ratio: 71 %
Pre FEV6/FVC Ratio: 98 %
RV % pred: 93 %
RV: 2.45 L
TLC % PRED: 76 %
TLC: 5.91 L

## 2016-09-08 NOTE — Progress Notes (Signed)
PFT done today. 

## 2016-09-14 ENCOUNTER — Other Ambulatory Visit: Payer: Self-pay | Admitting: Family Medicine

## 2016-09-14 DIAGNOSIS — I10 Essential (primary) hypertension: Secondary | ICD-10-CM

## 2016-09-14 NOTE — Telephone Encounter (Signed)
Refill request for valsartan-hydrchlorothiazide 320-25 mg approved for #30 with no refills.  Pt needs office visit.  Will send to schedulers to call pt for appt with [facility]. Dgaddy, CMA

## 2016-09-15 ENCOUNTER — Encounter: Payer: Self-pay | Admitting: Gastroenterology

## 2016-09-15 ENCOUNTER — Other Ambulatory Visit (INDEPENDENT_AMBULATORY_CARE_PROVIDER_SITE_OTHER): Payer: BLUE CROSS/BLUE SHIELD

## 2016-09-15 ENCOUNTER — Encounter (INDEPENDENT_AMBULATORY_CARE_PROVIDER_SITE_OTHER): Payer: Self-pay

## 2016-09-15 ENCOUNTER — Ambulatory Visit: Payer: Medicare Other | Admitting: Pulmonary Disease

## 2016-09-15 ENCOUNTER — Ambulatory Visit (INDEPENDENT_AMBULATORY_CARE_PROVIDER_SITE_OTHER): Payer: BLUE CROSS/BLUE SHIELD | Admitting: Gastroenterology

## 2016-09-15 VITALS — BP 140/64 | HR 92 | Ht 72.0 in | Wt 237.4 lb

## 2016-09-15 DIAGNOSIS — R109 Unspecified abdominal pain: Secondary | ICD-10-CM

## 2016-09-15 DIAGNOSIS — R634 Abnormal weight loss: Secondary | ICD-10-CM

## 2016-09-15 DIAGNOSIS — R194 Change in bowel habit: Secondary | ICD-10-CM

## 2016-09-15 DIAGNOSIS — R1084 Generalized abdominal pain: Secondary | ICD-10-CM | POA: Diagnosis not present

## 2016-09-15 LAB — FERRITIN: Ferritin: 548.1 ng/mL — ABNORMAL HIGH (ref 22.0–322.0)

## 2016-09-15 LAB — FOLATE: FOLATE: 6.9 ng/mL (ref >5.9)

## 2016-09-15 LAB — IBC PANEL
Iron: 106 ug/dL (ref 42–165)
Saturation Ratios: 31.7 % (ref 20.0–50.0)
Transferrin: 239 mg/dL (ref 212.0–360.0)

## 2016-09-15 LAB — IGA: IGA: 153 mg/dL (ref 68–378)

## 2016-09-15 LAB — VITAMIN B12: Vitamin B-12: 233 pg/mL (ref 211–911)

## 2016-09-15 MED ORDER — DICYCLOMINE HCL 10 MG PO CAPS: ORAL_CAPSULE | ORAL | 3 refills | Status: DC

## 2016-09-15 MED ORDER — OMEPRAZOLE 40 MG PO CPDR: DELAYED_RELEASE_CAPSULE | ORAL | 3 refills | Status: DC

## 2016-09-15 MED ORDER — NA SULFATE-K SULFATE-MG SULF 17.5-3.13-1.6 GM/177ML PO SOLN: ORAL | 0 refills | Status: DC
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
## 2016-09-15 NOTE — Patient Instructions (Addendum)
You have been scheduled for an endoscopy and colonoscopy. Please follow the written instructions given to you at your visit today. Please pick up your prep supplies at the pharmacy within the next 1-3 days. If you use inhalers (even only as needed), please bring them with you on the day of your procedure. Your physician has requested that you go to www.startemmi.com and enter the access code given to you at your visit today. This web site gives a general overview about your procedure. However, you should still follow specific instructions given to you by our office regarding your preparation for the procedure.  Your physician has requested that you go to the basement for the following lab work before leaving today: IBC, Ferritin, B12, IgA, TTG, GI pathogen panel  We have sent the following medications to your pharmacy for you to pick up at your convenience: Omeprazole 40 mg twice daily Bentyl   If you are age 50 or older, your body mass index should be between 23-30. Your Body mass index is 32.2 kg/m. If this is out of the aforementioned range listed, please consider follow up with your Primary Care Provider.
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
## 2016-09-17 ENCOUNTER — Other Ambulatory Visit: Payer: Self-pay | Admitting: Family Medicine

## 2016-09-17 DIAGNOSIS — I10 Essential (primary) hypertension: Secondary | ICD-10-CM

## 2016-09-19 LAB — TISSUE TRANSGLUTAMINASE, IGG: t-Transglutaminase (tTG) IgG: 22 U/mL — ABNORMAL HIGH

## 2016-09-21 NOTE — Telephone Encounter (Signed)
Refill request for valsartan-hydrochlorothiazide 320-25 mg tabs denied.  Refilled on 09/14/16 # 30 w/no refills by [facility]-per [facility] pt needs office visit.

## 2016-09-22 ENCOUNTER — Telehealth: Payer: Self-pay | Admitting: Gastroenterology

## 2016-09-22 ENCOUNTER — Other Ambulatory Visit: Payer: BLUE CROSS/BLUE SHIELD

## 2016-09-22 DIAGNOSIS — R194 Change in bowel habit: Secondary | ICD-10-CM

## 2016-09-22 DIAGNOSIS — R634 Abnormal weight loss: Secondary | ICD-10-CM

## 2016-09-22 DIAGNOSIS — R109 Unspecified abdominal pain: Secondary | ICD-10-CM

## 2016-09-26 LAB — GASTROINTESTINAL PATHOGEN PANEL PCR
C. DIFFICILE TOX A/B, PCR: NOT DETECTED
CRYPTOSPORIDIUM, PCR: NOT DETECTED
Campylobacter, PCR: NOT DETECTED
E coli (ETEC) LT/ST PCR: NOT DETECTED
E coli (STEC) stx1/stx2, PCR: NOT DETECTED
E coli 0157, PCR: NOT DETECTED
GIARDIA LAMBLIA, PCR: NOT DETECTED
NOROVIRUS, PCR: NOT DETECTED
ROTAVIRUS, PCR: NOT DETECTED
Salmonella, PCR: NOT DETECTED
Shigella, PCR: NOT DETECTED

## 2016-09-27 ENCOUNTER — Telehealth: Payer: Self-pay | Admitting: Gastroenterology

## 2016-09-27 NOTE — Telephone Encounter (Signed)
Patty I believe you had left this patient a message, thank you.

## 2016-09-27 NOTE — Telephone Encounter (Signed)
See result note.  

## 2016-10-08 ENCOUNTER — Other Ambulatory Visit: Payer: Self-pay | Admitting: Family Medicine

## 2016-10-15 DIAGNOSIS — Z23 Encounter for immunization: Secondary | ICD-10-CM | POA: Diagnosis not present

## 2016-10-17 ENCOUNTER — Encounter: Payer: Self-pay | Admitting: Gastroenterology

## 2016-10-20 ENCOUNTER — Ambulatory Visit (INDEPENDENT_AMBULATORY_CARE_PROVIDER_SITE_OTHER): Payer: BLUE CROSS/BLUE SHIELD | Admitting: Emergency Medicine

## 2016-10-20 ENCOUNTER — Encounter: Payer: Self-pay | Admitting: Emergency Medicine

## 2016-10-20 VITALS — BP 122/66 | HR 93 | Temp 98.3°F | Resp 16 | Ht 72.5 in | Wt 235.8 lb

## 2016-10-20 DIAGNOSIS — M25512 Pain in left shoulder: Secondary | ICD-10-CM | POA: Diagnosis not present

## 2016-10-20 DIAGNOSIS — I10 Essential (primary) hypertension: Secondary | ICD-10-CM

## 2016-10-20 DIAGNOSIS — G8929 Other chronic pain: Secondary | ICD-10-CM

## 2016-10-20 MED ORDER — VALSARTAN-HYDROCHLOROTHIAZIDE 320-25 MG PO TABS: ORAL_TABLET | ORAL | 3 refills | Status: DC

## 2016-10-20 NOTE — Patient Instructions (Addendum)
IF you received an x-ray today, you will receive an invoice from [facility]. Please contact [facility] at [phone] with questions or concerns regarding your invoice.   IF you received labwork today, you will receive an invoice from [facility]. Please contact [facility] at [phone] with questions or concerns regarding your invoice.   Our billing staff will not be able to assist you with questions regarding bills from these companies.  You will be contacted with the lab results as soon as they are available. The fastest way to get your results is to activate your My Chart account. Instructions are located on the last page of this paperwork. If you have not heard from [place] regarding the results in 2 weeks, please contact this office.     Hypertension Hypertension is another name for high blood pressure. High blood pressure forces your heart to work harder to pump blood. This can cause problems over time. There are two numbers in a blood pressure reading. There is a top number (systolic) over a bottom number (diastolic). It is best to have a blood pressure below 120/80. Healthy choices can help lower your blood pressure. You may need medicine to help lower your blood pressure if:  Your blood pressure cannot be lowered with healthy choices.  Your blood pressure is higher than 130/80.  Follow these instructions at home: Eating and drinking  If directed, follow the DASH eating plan. This diet includes: ? Filling half of your plate at each meal with fruits and vegetables. ? Filling one quarter of your plate at each meal with whole grains. Whole grains include whole wheat pasta, brown rice, and whole grain bread. ? Eating or drinking low-fat dairy products, such as skim milk or low-fat yogurt. ? Filling one quarter of your plate at each meal with low-fat (lean) proteins. Low-fat proteins include fish, skinless chicken, eggs, beans, and tofu. ? Avoiding fatty meat, cured  and processed meat, or chicken with skin. ? Avoiding premade or processed food.  Eat less than 1,500 mg of salt (sodium) a day.  Limit alcohol use to no more than 1 drink a day for nonpregnant women and 2 drinks a day for men. One drink equals 12 oz of beer, 5 oz of wine, or 1 oz of hard liquor. Lifestyle  Work with your doctor to stay at a healthy weight or to lose weight. Ask your doctor what the best weight is for you.  Get at least 30 minutes of exercise that causes your heart to beat faster (aerobic exercise) most days of the week. This may include walking, swimming, or biking.  Get at least 30 minutes of exercise that strengthens your muscles (resistance exercise) at least 3 days a week. This may include lifting weights or pilates.  Do not use any products that contain nicotine or tobacco. This includes cigarettes and e-cigarettes. If you need help quitting, ask your doctor.  Check your blood pressure at home as told by your doctor.  Keep all follow-up visits as told by your doctor. This is important. Medicines  Take over-the-counter and prescription medicines only as told by your doctor. Follow directions carefully.  Do not skip doses of blood pressure medicine. The medicine does not work as well if you skip doses. Skipping doses also puts you at risk for problems.  Ask your doctor about side effects or reactions to medicines that you should watch for. Contact a doctor if:  You think you are having a reaction to the  medicine you are taking.  You have headaches that keep coming back (recurring).  You feel dizzy.  You have swelling in your ankles.  You have trouble with your vision. Get help right away if:  You get a very bad headache.  You start to feel confused.  You feel weak or numb.  You feel faint.  You get very bad pain in your: ? Chest. ? Belly (abdomen).  You throw up (vomit) more than once.  You have trouble breathing. Summary  Hypertension is  another name for high blood pressure.  Making healthy choices can help lower blood pressure. If your blood pressure cannot be controlled with healthy choices, you may need to take medicine. This information is not intended to replace advice given to you by your health care provider. Make sure you discuss any questions you have with your health care provider. Document Released: 06/21/2007 Document Revised: 12/01/2015 Document Reviewed: 12/01/2015 Elsevier Interactive Patient Education  [employer].
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
## 2016-10-21 ENCOUNTER — Other Ambulatory Visit: Payer: Self-pay | Admitting: Family Medicine

## 2016-10-21 DIAGNOSIS — I10 Essential (primary) hypertension: Secondary | ICD-10-CM

## 2016-10-26 ENCOUNTER — Telehealth: Payer: Self-pay | Admitting: Gastroenterology
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
## 2016-10-26 NOTE — Telephone Encounter (Signed)
Okay, he can reschedule at his convenience

## 2016-10-26 NOTE — Telephone Encounter (Signed)
Appointment Rescheduled for 12-15-16.

## 2016-10-27 ENCOUNTER — Encounter: Payer: BLUE CROSS/BLUE SHIELD | Admitting: Gastroenterology

## 2016-11-03 ENCOUNTER — Telehealth: Payer: Self-pay | Admitting: *Deleted

## 2016-11-03 NOTE — Telephone Encounter (Signed)
Lattie Haw from [facility] called and faxed the colonoscopy and path report from [id number].  Forwarded to Dr. Doyne Keel inbox in his office.

## 2016-11-03 NOTE — Telephone Encounter (Signed)
Called and spoke to a CMA at [facility], Dr. Collene Mares. Dr. Ulyses Amor office.  I advised I had faxed a signed release of information form to them on 09-15-2016.  She tried to look up the reports in the old system.She said the girl in Medical Records may be able to find the information for [place].  She will have that person call me. ( I forgot to ask the name.

## 2016-11-07 ENCOUNTER — Telehealth: Payer: Self-pay | Admitting: Gastroenterology
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
## 2016-11-13 ENCOUNTER — Other Ambulatory Visit: Payer: Self-pay | Admitting: Gastroenterology

## 2016-11-14 ENCOUNTER — Other Ambulatory Visit: Payer: Self-pay

## 2016-11-14 MED ORDER — OMEPRAZOLE 40 MG PO CPDR: 40.0000 mg | DELAYED_RELEASE_CAPSULE | Freq: Two times a day (BID) | ORAL | 1 refills | Status: DC

## 2016-11-15 ENCOUNTER — Other Ambulatory Visit: Payer: Self-pay | Admitting: Family Medicine

## 2016-11-15 NOTE — Telephone Encounter (Signed)
Refill request benzonatate 100 mg #20 with 2 refill approved per dr Nolon Rod.

## 2016-11-17 ENCOUNTER — Encounter: Payer: Self-pay | Admitting: Physician Assistant

## 2016-11-17 ENCOUNTER — Ambulatory Visit (INDEPENDENT_AMBULATORY_CARE_PROVIDER_SITE_OTHER): Payer: Medicare Other | Admitting: Physician Assistant

## 2016-11-17 VITALS — BP 118/74 | HR 86 | Temp 98.8°F | Resp 18 | Ht 72.5 in | Wt 234.8 lb

## 2016-11-17 DIAGNOSIS — F17200 Nicotine dependence, unspecified, uncomplicated: Secondary | ICD-10-CM | POA: Diagnosis not present

## 2016-11-17 DIAGNOSIS — J441 Chronic obstructive pulmonary disease with (acute) exacerbation: Secondary | ICD-10-CM

## 2016-11-17 MED ORDER — DOXYCYCLINE HYCLATE 100 MG PO CAPS: 100.0000 mg | ORAL_CAPSULE | Freq: Two times a day (BID) | ORAL | 0 refills | Status: AC

## 2016-11-17 MED ORDER — AZELASTINE HCL 0.1 % NA SOLN: 2.0000 | Freq: Two times a day (BID) | NASAL | 0 refills | Status: DC

## 2016-11-17 NOTE — Patient Instructions (Addendum)
The Tessalon perles are at the pharmacy. Dr. Nolon Rod authorized the refill on Wednesday. Keep working on quitting smoking! Use the albuterol inhaler every 4 hours, as needed for cough, chest tightness, shortness of breath, wheezing.    IF you received an x-ray today, you will receive an invoice from [facility]. Please contact [facility] at [phone] with questions or concerns regarding your invoice.   IF you received labwork today, you will receive an invoice from [place]. Please contact [facility] at [phone] with questions or concerns regarding your invoice.   Our billing staff will not be able to assist you with questions regarding bills from these companies.  You will be contacted with the lab results as soon as they are available. The fastest way to get your results is to activate your My Chart account. Instructions are located on the last page of this paperwork. If you have not heard from [place] regarding the results in 2 weeks, please contact this office.    Did you know that you begin to benefit from quitting smoking within the first twenty minutes? It's TRUE.  At 20 minutes: -blood pressure decreases -pulse rate drops -body temperature of hands and feet increases  At 8 hours: -carbon monoxide level in blood drops to normal -oxygen level in blood increases to normal  At 24 hours: -the chance of heart attack decreases  At 48 hours: -nerve endings start regrowing -ability to smell and taste is enhanced  2 weeks-3 months: -circulation improves -walking becomes easier -lung function improves  1-9 months: -coughing, sinus congestion, fatigue and shortness of breath decreases  1 year: -excess risk of heart disease is decreased to HALF that of a smoker  5 years: Stroke risk is reduced to that of people who have never smoked  10 years: -risk of lung cancer drops to as little as half that of continuing smokers -risk of cancer of the mouth,  throat, esophagus, bladder, kidney and pancreas decreases -risk of ulcer decreases  15 years -risk of heart disease is now similar to that of people who have never smoked -risk of death returns to nearly the level of people who have never smoked

## 2016-11-17 NOTE — Progress Notes (Signed)
Subjective:    Patient ID: John Blake, male    DOB: Feb 21, 1945, 71 y.o.   MRN: [id number]  HPI  Mr. John Blake is a 71 year old Caucasian male with a past medical history significant for type 2 diabetes mellitus, hypertension, hyperlipidemia, COPD, chronic kidney disease, and chronic allergic rhinitis who presents today with sore throat, nasal congestion, and cough. Mr. John Blake reports his symptoms started 69 week. He reports it started with a dry, sore throat and then the cough started the next day. Mr. John Blake reports his throat is still very dry right now. He states when his throat is dry the cough is worse. Mr. John Blake reports he is coughing up yellow mucus. He describes the cough as a raspy cough that is sometimes dry and sometimes wet. Mr. John Blake denies fevers, but reports he has had some "hot flashes" and chills. He denies headaches. Mr. John Blake states his eyes have been more watery due to the coughing. He denies ear pain. Mr. John Blake reports he has sounded hoarse for the past 69 days. He denies nausea or vomiting. He endorses a few episodes of diarrhea. Mr. John Blake states his energy level may be mildly decreased because his cough is waking him up at night. He reports his cough is worse when he is laying down and improves when he is up and moving around. Mr. John Blake also states he has had a decreased appetite. He reports the Mucinex seems to be helping his symptoms and also cough drops have helped with his sore throat. He is using his Advair, but has not used Flonase.     Mr. John Blake reports he has had his flu vaccine and pneumonia vaccine. He denies any known sick contacts.   Mr. John Blake states he smokes a couple of cigarettes a day and is currently on Chantix. He reports he used to smoke 1 pack per day for at least 20 years.    Medications:  Prior to Admission medications   Medication Sig Start Date End Date Taking? Authorizing Provider  acetaminophen (TYLENOL) 325 MG tablet Take 325 mg by mouth 2 (two) times daily.   Yes  [provider]  albuterol (PROAIR HFA) 108 (90 Base) MCG/ACT inhaler Inhale 2 puffs into the lungs every 6 (six) hours as needed for wheezing or shortness of breath. 03/17/16  Yes Stallings, Zoe A, MD  atorvastatin (LIPITOR) 20 MG tablet TAKE 1 TABLET (20 MG TOTAL) BY MOUTH EVERY MORNING. 04/05/16  Yes Tereasa Coop, PA-C  benzonatate (TESSALON) 100 MG capsule TAKE 1 CAPSULE (100 MG TOTAL) BY MOUTH 2 (TWO) TIMES DAILY AS NEEDED FOR COUGH. 11/15/16  Yes Stallings, Zoe A, MD  dicyclomine (BENTYL) 10 MG capsule Take 1 tab by mouth every 8 hours as needed for abdominal pain. 09/15/16  Yes Armbruster, Carlota Raspberry, MD  diphenhydrAMINE (BENADRYL) 25 MG tablet Take 25 mg by mouth every 6 (six) hours as needed.   Yes [provider]  fluticasone (FLONASE) 50 MCG/ACT nasal spray Place 2 sprays into both nostrils daily. 02/12/16  Yes Jeffery, Chelle, PA-C  Fluticasone-Salmeterol (ADVAIR DISKUS) 250-50 MCG/DOSE AEPB Inhale 1 puff into the lungs 2 (two) times daily. 03/17/16  Yes Stallings, Zoe A, MD  glucose blood test strip Use to test blood sugar once daily. Dx: E11.9 06/28/15  Yes [name], MD  Lancets MISC Use for home glucose monitoring 01/21/15  Yes Jeffery, Chelle, PA-C  Na Sulfate-K Sulfate-Mg Sulf 17.5-3.13-1.6 GM/180ML SOLN Take as directed for colonoscopy prep. 09/15/16  Yes Armbruster, Carlota Raspberry, MD  omeprazole (PRILOSEC) 40 MG capsule Take 1 capsule (40 mg total) by mouth 2 (two) times daily. 11/14/16  Yes Armbruster, Carlota Raspberry, MD  ONE TOUCH LANCETS MISC Use for home glucose monitoring 01/29/15  Yes Jeffery, Chelle, PA-C  valsartan-hydrochlorothiazide (DIOVAN-HCT) 320-25 MG tablet TAKE 1 TABLET BY MOUTH EVERY DAY IN THE MORNING 10/20/16  Yes Sagardia, Ines Bloomer, MD  varenicline (CHANTIX CONTINUING MONTH PAK) 1 MG tablet Take 1 tablet (1 mg total) by mouth 2 (two) times daily. 01/28/16  Yes Forrest Moron, MD   Allergies:  Allergies  Allergen Reactions  . Nsaids     Kidney disease     Chronic Medical Conditions:  Patient Active Problem List   Diagnosis Date Noted  . Chronic left shoulder pain 10/20/2016  . Complex renal cyst 08/02/2016  . 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 COPD (chronic obstructive pulmonary disease) ([place]) 07/14/2016  . Smoker 07/14/2016  . Chronic seasonal allergic rhinitis 07/14/2016  . CKD (chronic kidney disease) stage 3, GFR 30-59 ml/min (HCC) 05/30/2016  . Essential hypertension 02/05/2015  . Hyperlipidemia 02/05/2015  . BMI 34.0-34.9,adult 02/05/2015  . Type 2 diabetes mellitus without complication ([place]) 68/12/7515  . Shoulder impingement syndrome 06/17/2014   Review of Systems     Objective:   Physical Exam  Constitutional: He appears well-developed and well-nourished. He is active and cooperative. No distress.  BP 118/74 (BP Location: Left Arm, Patient Position: Sitting, Cuff Size: Normal)   Pulse 86   Temp 98.8 F (37.1 C) (Oral)   Resp 18   Ht 6' 0.5" (1.842 m)   Wt 234 lb 12.8 oz (106.5 kg)   SpO2 97%   BMI 31.41 kg/m    HENT:  Head: Normocephalic and atraumatic.  Mouth/Throat: Oropharynx is clear and moist.  Eyes: Pupils are equal, round, and reactive to light. Conjunctivae are normal.  Neck: Normal range of motion. Neck supple. No thyromegaly present.  Cardiovascular: Normal rate, regular rhythm, S1 normal, S2 normal, normal heart sounds and intact distal pulses.   No murmur heard. Pulses:      Radial pulses are 2+ on the right side, and 2+ on the left side.  Pulmonary/Chest: He has no wheezes. He has rhonchi in the right lower field and the left lower field.  Lymphadenopathy:    He has no cervical adenopathy.  Neurological: He is alert.  Skin: Skin is warm and dry. He is not diaphoretic.      Assessment & Plan:  1. COPD with acute exacerbation (HCC) - Begin Azelastine 0.1% nasal spray 2 sprays in both nostrils BID  - Begin Doxycycline 100mg  BID  - Informed patient  that Tessalon capsules were prescribed on 11/15/16 and ready at his pharmacy. Instructed patient to take 100mg  BID as needed for cough - Also instructed patient to continue Advair 250-50mg c/dose 1 puff into both lungs BID  - Continue Albuterol 2 puffs every 4 hours as needed for cough, wheezing, and shortness of breath. - Instructed patient to return to clinic if symptoms worsen or do not improve  2. Smoker - Encouraged patient to continue to Chantix  [employer], PA-S

## 2016-11-17 NOTE — Progress Notes (Signed)
Patient ID: John Blake, male    DOB: 1945-11-04, 71 y.o.   MRN: [id number]  PCP: Harrison Mons, PA-C  Chief Complaint  Patient presents with  . Sore Throat    x1 week, Pt states she coughing up mucous, raspy voice for 4 days, Pt states throat it itchy, dry, and making him want to gag. Pt states it doesn't hurt to swollow or eat. Pt states he hasn't noticed any fever but is having chills.    Subjective:   Presents for evaluation of sore throat x 1 week.  Associated symptoms include nasal congestion and cough. Cough was initially non-productive. Now produces yellow phlegm, and he describes it as "raspy." Worse with lying down, better when he is up and moving around. Voice is hoarse. Some loose stools. Decreased appetite Feels hot and cold in flashes, but doesn't think he's had fever.  No nausea, vomiting. No headaches, ear pain, body aches. No SOB, CP. No LE edema.  Mucinex is helping. Cough drops are helping. Using Advair, but not Flonase. Has cut back on smoking with Chantix, down to only a couple of cigarettes/day, down from 1 PPD x 20 years.  Received a flu vaccine 10/15/2016 (Fluzone High Dose). Pneumococcal series is complete (he has actually received 2 doses of each Prevnar-13 and Pneumovax 23  Review of Systems As above.    Patient Active Problem List   Diagnosis Date Noted  . Chronic left shoulder pain 10/20/2016  . Complex renal cyst 08/02/2016  . 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 COPD (chronic obstructive pulmonary disease) ([place]) 07/14/2016  . Smoker 07/14/2016  . Chronic seasonal allergic rhinitis 07/14/2016  . CKD (chronic kidney disease) stage 3, GFR 30-59 ml/min (HCC) 05/30/2016  . Essential hypertension 02/05/2015  . Hyperlipidemia 02/05/2015  . BMI 34.0-34.9,adult 02/05/2015  . Type 2 diabetes mellitus without complication ([place]) 89/38/1017  . Shoulder impingement syndrome 06/17/2014     Prior  to Admission medications   Medication Sig Start Date End Date Taking? Authorizing Provider  acetaminophen (TYLENOL) 325 MG tablet Take 325 mg by mouth 2 (two) times daily.   Yes [provider]  albuterol (PROAIR HFA) 108 (90 Base) MCG/ACT inhaler Inhale 2 puffs into the lungs every 6 (six) hours as needed for wheezing or shortness of breath. 03/17/16  Yes Stallings, Zoe A, MD  atorvastatin (LIPITOR) 20 MG tablet TAKE 1 TABLET (20 MG TOTAL) BY MOUTH EVERY MORNING. 04/05/16  Yes Tereasa Coop, PA-C  benzonatate (TESSALON) 100 MG capsule TAKE 1 CAPSULE (100 MG TOTAL) BY MOUTH 2 (TWO) TIMES DAILY AS NEEDED FOR COUGH. 11/15/16  Yes Stallings, Zoe A, MD  dicyclomine (BENTYL) 10 MG capsule Take 1 tab by mouth every 8 hours as needed for abdominal pain. 09/15/16  Yes Armbruster, Carlota Raspberry, MD  diphenhydrAMINE (BENADRYL) 25 MG tablet Take 25 mg by mouth every 6 (six) hours as needed.   Yes [provider]  fluticasone (FLONASE) 50 MCG/ACT nasal spray Place 2 sprays into both nostrils daily. 02/12/16  Yes Ilario Dhaliwal, PA-C  Fluticasone-Salmeterol (ADVAIR DISKUS) 250-50 MCG/DOSE AEPB Inhale 1 puff into the lungs 2 (two) times daily. 03/17/16  Yes Stallings, Zoe A, MD  glucose blood test strip Use to test blood sugar once daily. Dx: E11.9 06/28/15  Yes [name], MD  Lancets MISC Use for home glucose monitoring 01/21/15  Yes Averey Koning, PA-C  Na Sulfate-K Sulfate-Mg Sulf 17.5-3.13-1.6 GM/180ML SOLN Take as directed for colonoscopy  prep. 09/15/16  Yes Armbruster, Carlota Raspberry, MD  omeprazole (PRILOSEC) 40 MG capsule Take 1 capsule (40 mg total) by mouth 2 (two) times daily. 11/14/16  Yes Armbruster, Carlota Raspberry, MD  ONE TOUCH LANCETS MISC Use for home glucose monitoring 01/29/15  Yes Deysy Schabel, PA-C  valsartan-hydrochlorothiazide (DIOVAN-HCT) 320-25 MG tablet TAKE 1 TABLET BY MOUTH EVERY DAY IN THE MORNING 10/20/16  Yes Sagardia, Ines Bloomer, MD  varenicline (CHANTIX CONTINUING MONTH PAK) 1  MG tablet Take 1 tablet (1 mg total) by mouth 2 (two) times daily. 01/28/16  Yes Forrest Moron, MD     Allergies  Allergen Reactions  . Nsaids     Kidney disease       Objective:  Physical Exam  Constitutional: He is oriented to person, place, and time. He appears well-developed and well-nourished. He is active and cooperative. No distress.  BP 118/74 (BP Location: Left Arm, Patient Position: Sitting, Cuff Size: Normal)   Pulse 86   Temp 98.8 F (37.1 C) (Oral)   Resp 18   Ht 6' 0.5" (1.842 m)   Wt 234 lb 12.8 oz (106.5 kg)   SpO2 97%   BMI 31.41 kg/m   HENT:  Head: Normocephalic and atraumatic.  Right Ear: Hearing, tympanic membrane, external ear and ear canal normal.  Left Ear: Hearing, tympanic membrane, external ear and ear canal normal.  Nose: Nose normal.  Mouth/Throat: Oropharynx is clear and moist and mucous membranes are normal. No oropharyngeal exudate.  Eyes: Conjunctivae are normal. No scleral icterus.  Neck: Normal range of motion. Neck supple. No thyromegaly present.  Cardiovascular: Normal rate, regular rhythm and normal heart sounds.  Pulses:      Radial pulses are 2+ on the right side, and 2+ on the left side.  Pulmonary/Chest: Effort normal. He has rhonchi in the right lower field and the left lower field.  Lymphadenopathy:       Head (right side): No tonsillar, no preauricular, no posterior auricular and no occipital adenopathy present.       Head (left side): No tonsillar, no preauricular, no posterior auricular and no occipital adenopathy present.    He has no cervical adenopathy.       Right: No supraclavicular adenopathy present.       Left: No supraclavicular adenopathy present.  Neurological: He is alert and oriented to person, place, and time. No sensory deficit.  Skin: Skin is warm, dry and intact. No rash noted. No cyanosis or erythema. Nails show no clubbing.  Psychiatric: He has a normal mood and affect. His speech is normal and behavior is  normal.           Assessment & Plan:   Problem List Items Addressed This Visit    Smoker    Encouraged continued efforts for smoking cessation.       Other Visit Diagnoses    COPD with acute exacerbation ([place])    -  Primary   Doxycycline and supportive care.   Relevant Medications   azelastine (ASTELIN) 0.1 % nasal spray       Return if symptoms worsen or fail to improve.   Fara Chute, PA-C [facility]

## 2016-12-03 NOTE — Assessment & Plan Note (Signed)
Encouraged continued efforts for smoking cessation.

## 2016-12-13 ENCOUNTER — Other Ambulatory Visit: Payer: Self-pay | Admitting: Physician Assistant

## 2016-12-13 DIAGNOSIS — J441 Chronic obstructive pulmonary disease with (acute) exacerbation: Secondary | ICD-10-CM

## 2016-12-15 ENCOUNTER — Encounter: Payer: Self-pay | Admitting: Gastroenterology

## 2016-12-15 ENCOUNTER — Other Ambulatory Visit: Payer: Self-pay

## 2016-12-15 ENCOUNTER — Ambulatory Visit (AMBULATORY_SURGERY_CENTER): Payer: BLUE CROSS/BLUE SHIELD | Admitting: Gastroenterology

## 2016-12-15 VITALS — BP 113/72 | HR 75 | Temp 99.0°F | Resp 18 | Ht 72.0 in | Wt 237.0 lb

## 2016-12-15 DIAGNOSIS — R1013 Epigastric pain: Secondary | ICD-10-CM

## 2016-12-15 DIAGNOSIS — D125 Benign neoplasm of sigmoid colon: Secondary | ICD-10-CM | POA: Diagnosis not present

## 2016-12-15 DIAGNOSIS — R634 Abnormal weight loss: Secondary | ICD-10-CM

## 2016-12-15 DIAGNOSIS — D128 Benign neoplasm of rectum: Secondary | ICD-10-CM

## 2016-12-15 DIAGNOSIS — D123 Benign neoplasm of transverse colon: Secondary | ICD-10-CM | POA: Diagnosis not present

## 2016-12-15 DIAGNOSIS — R194 Change in bowel habit: Secondary | ICD-10-CM

## 2016-12-15 DIAGNOSIS — K635 Polyp of colon: Secondary | ICD-10-CM

## 2016-12-15 DIAGNOSIS — D120 Benign neoplasm of cecum: Secondary | ICD-10-CM | POA: Diagnosis not present

## 2016-12-15 DIAGNOSIS — R109 Unspecified abdominal pain: Secondary | ICD-10-CM | POA: Diagnosis not present

## 2016-12-15 MED ORDER — SODIUM CHLORIDE 0.9 % IV SOLN: 500.0000 mL | INTRAVENOUS | Status: DC

## 2016-12-15 NOTE — Progress Notes (Signed)
Pt's states no medical or surgical changes since previsit or office visit. 

## 2016-12-15 NOTE — Op Note (Signed)
[facility] Patient Name: John Blake Procedure Date: 12/15/2016 1:17 PM MRN: [id number] Endoscopist: Remo Lipps P. Glenna Brunkow MD, MD Age: 71 Referring MD:  Date of Birth: 30-Apr-1945 Gender: Male Account #: [id number] Procedure:                Colonoscopy Indications:              Change in bowel habits, Weight loss Medicines:                Monitored Anesthesia Care Procedure:                Pre-Anesthesia Assessment:                           - Prior to the procedure, a History and Physical                            was performed, and patient medications and                            allergies were reviewed. The patient's tolerance of                            previous anesthesia was also reviewed. The risks                            and benefits of the procedure and the sedation                            options and risks were discussed with the patient.                            All questions were answered, and informed consent                            was obtained. Prior Anticoagulants: The patient has                            taken no previous anticoagulant or antiplatelet                            agents. ASA Grade Assessment: III - A patient with                            severe systemic disease. After reviewing the risks                            and benefits, the patient was deemed in                            satisfactory condition to undergo the procedure.                           After obtaining informed consent, the colonoscope  was passed under direct vision. Throughout the                            procedure, the patient's blood pressure, pulse, and                            oxygen saturations were monitored continuously. The                            Colonoscope was introduced through the anus and                            advanced to the the terminal ileum, with                            identification of the  appendiceal orifice and IC                            valve. The colonoscopy was performed without                            difficulty. The patient tolerated the procedure                            well. The quality of the bowel preparation was                            adequate. The terminal ileum, ileocecal valve,                            appendiceal orifice, and rectum were photographed. Scope In: 1:45:51 PM Scope Out: 2:06:58 PM Scope Withdrawal Time: 0 hours 18 minutes 0 seconds  Total Procedure Duration: 0 hours 21 minutes 7 seconds  Findings:                 The perianal and digital rectal examinations were                            normal.                           The terminal ileum appeared normal.                           Two sessile polyps were found in the cecum. The                            polyps were 3 to 5 mm in size. These polyps were                            removed with a cold snare. Resection and retrieval                            were complete.  A 7 mm polyp was found in the transverse colon. The                            polyp was sessile. The polyp was removed with a                            cold snare. Resection and retrieval were complete.                           A 4 mm polyp was found in the transverse colon. The                            polyp was sessile. The polyp was removed with a                            cold snare. Resection and retrieval were complete.                           Two sessile polyps were found in the sigmoid colon.                            The polyps were 3 to 5 mm in size. These polyps                            were removed with a cold snare. Resection and                            retrieval were complete.                           A 7 mm polyp was found in the rectum. The polyp was                            sessile. The polyp was removed with a cold snare.                             Resection and retrieval were complete.                           Internal hemorrhoids were found during                            retroflexion. The hemorrhoids were moderate.                           The exam was otherwise without abnormality.                           Biopsies for histology were taken with a cold                            forceps from the right colon, left colon and  transverse colon for evaluation of microscopic                            colitis. Complications:            No immediate complications. Estimated blood loss:                            Minimal. Estimated Blood Loss:     Estimated blood loss was minimal. Estimated blood                            loss was minimal. Impression:               - The examined portion of the ileum was normal.                           - Two 3 to 5 mm polyps in the cecum, removed with a                            cold snare. Resected and retrieved.                           - One 7 mm polyp in the transverse colon, removed                            with a cold snare. Resected and retrieved.                           - One 4 mm polyp in the transverse colon, removed                            with a cold snare. Resected and retrieved.                           - Two 3 to 5 mm polyps in the sigmoid colon,                            removed with a cold snare. Resected and retrieved.                           - One 7 mm polyp in the rectum, removed with a cold                            snare. Resected and retrieved.                           - Internal hemorrhoids.                           - The examination was otherwise normal.                           - Biopsies were taken with a cold forceps from the  right colon, left colon and transverse colon for                            evaluation of microscopic colitis. Recommendation:           - Patient has a contact number  available for                            emergencies. The signs and symptoms of potential                            delayed complications were discussed with the                            patient. Return to normal activities tomorrow.                            Written discharge instructions were provided to the                            patient.                           - Resume previous diet.                           - Continue present medications.                           - Await pathology results.                           - Repeat colonoscopy is recommended for                            surveillance. The colonoscopy date will be                            determined after pathology results from today's                            exam become available for review.                           - No ibuprofen, naproxen, or other non-steroidal                            anti-inflammatory drugs for 2 weeks after polyp                            removal. Remo Lipps P. Alben Jepsen MD, MD 12/15/2016 2:17:56 PM This report has been signed electronically.

## 2016-12-15 NOTE — Patient Instructions (Signed)
YOU HAD AN ENDOSCOPIC PROCEDURE TODAY AT [facility]:   Refer to the procedure report that was given to you for any specific questions about what was found during the examination.  If the procedure report does not answer your questions, please call your gastroenterologist to clarify.  If you requested that your care partner not be given the details of your procedure findings, then the procedure report has been included in a sealed envelope for you to review at your convenience later.  YOU SHOULD EXPECT: Some feelings of bloating in the abdomen. Passage of more gas than usual.  Walking can help get rid of the air that was put into your GI tract during the procedure and reduce the bloating. If you had a lower endoscopy (such as a colonoscopy or flexible sigmoidoscopy) you may notice spotting of blood in your stool or on the toilet paper. If you underwent a bowel prep for your procedure, you may not have a normal bowel movement for a few days.  Please Note:  You might notice some irritation and congestion in your nose or some drainage.  This is from the oxygen used during your procedure.  There is no need for concern and it should clear up in a day or so.  SYMPTOMS TO REPORT IMMEDIATELY:   Following lower endoscopy (colonoscopy or flexible sigmoidoscopy):  Excessive amounts of blood in the stool  Significant tenderness or worsening of abdominal pains  Swelling of the abdomen that is new, acute  Fever of 100F or higher   Following upper endoscopy (EGD)  Vomiting of blood or coffee ground material  New chest pain or pain under the shoulder blades  Painful or persistently difficult swallowing  New shortness of breath  Fever of 100F or higher  Black, tarry-looking stools  For urgent or emergent issues, a gastroenterologist can be reached at any hour by calling [phone].   DIET:  We do recommend a small meal at first, but then you may proceed to your regular diet.  Drink  plenty of fluids but you should avoid alcoholic beverages for 24 hours.  ACTIVITY:  You should plan to take it easy for the rest of today and you should NOT DRIVE or use heavy machinery until tomorrow (because of the sedation medicines used during the test).    FOLLOW UP: Our staff will call the number listed on your records the next business day following your procedure to check on you and address any questions or concerns that you may have regarding the information given to you following your procedure. If we do not reach you, we will leave a message.  However, if you are feeling well and you are not experiencing any problems, there is no need to return our call.  We will assume that you have returned to your regular daily activities without incident.  If any biopsies were taken you will be contacted by phone or by letter within the next 1-3 weeks.  Please call us at [phone] if you have not heard about the biopsies in 3 weeks.    SIGNATURES/CONFIDENTIALITY: You and/or your care partner have signed paperwork which will be entered into your electronic medical record.  These signatures attest to the fact that that the information above on your After Visit Summary has been reviewed and is understood.  Full responsibility of the confidentiality of this discharge information lies with you and/or your care-partner.  Read all handouts given to you  By your recovery room  nurse.

## 2016-12-15 NOTE — Op Note (Signed)
[facility] Patient Name: John Blake Procedure Date: 12/15/2016 1:18 PM MRN: [id number] Endoscopist: Remo Lipps P. Armbruster MD, MD Age: 71 Referring MD:  Date of Birth: December 14, 1945 Gender: Male Account #: [id number] Procedure:                Upper GI endoscopy Indications:              Epigastric abdominal pain, Weight loss, mildly                            positive celiac disease Medicines:                Monitored Anesthesia Care Procedure:                Pre-Anesthesia Assessment:                           - Prior to the procedure, a History and Physical                            was performed, and patient medications and                            allergies were reviewed. The patient's tolerance of                            previous anesthesia was also reviewed. The risks                            and benefits of the procedure and the sedation                            options and risks were discussed with the patient.                            All questions were answered, and informed consent                            was obtained. Prior Anticoagulants: The patient has                            taken no previous anticoagulant or antiplatelet                            agents. ASA Grade Assessment: III - A patient with                            severe systemic disease. After reviewing the risks                            and benefits, the patient was deemed in                            satisfactory condition to undergo the procedure.  After obtaining informed consent, the endoscope was                            passed under direct vision. Throughout the                            procedure, the patient's blood pressure, pulse, and                            oxygen saturations were monitored continuously. The                            Endoscope was introduced through the mouth, and                            advanced to the second part  of duodenum. The upper                            GI endoscopy was accomplished without difficulty.                            The patient tolerated the procedure well. Scope In: Scope Out: Findings:                 Esophagogastric landmarks were identified: the                            Z-line was found at 41 cm, the gastroesophageal                            junction was found at 41 cm and the upper extent of                            the gastric folds was found at 44 cm from the                            incisors.                           A 3 cm hiatal hernia was present.                           The exam of the esophagus was otherwise normal.                           The entire examined stomach was normal. Biopsies                            were taken from the antrum, body, and incisura with                            a cold forceps for Helicobacter pylori testing.  The duodenal bulb and second portion of the                            duodenum were normal. Biopsies for histology were                            taken with a cold forceps for evaluation of celiac                            disease. Complications:            No immediate complications. Estimated blood loss:                            Minimal. Estimated Blood Loss:     Estimated blood loss was minimal. Impression:               - Esophagogastric landmarks identified.                           - 3 cm hiatal hernia.                           - Normal esophagus.                           - Normal stomach. Biopsied to rule out H pylori.                           - Normal duodenal bulb and second portion of the                            duodenum. Biopsied to rule out celiac disease. Recommendation:           - Patient has a contact number available for                            emergencies. The signs and symptoms of potential                            delayed complications were discussed  with the                            patient. Return to normal activities tomorrow.                            Written discharge instructions were provided to the                            patient.                           - Resume previous diet.                           - Continue present medications.                           -  Await pathology results. Remo Lipps P. Armbruster MD, MD 12/15/2016 2:21:24 PM This report has been signed electronically.

## 2016-12-15 NOTE — Progress Notes (Signed)
A/ox3 pleased with MAC, report to [employer]

## 2016-12-18 ENCOUNTER — Telehealth: Payer: Self-pay | Admitting: *Deleted

## 2016-12-18 NOTE — Telephone Encounter (Signed)

## 2016-12-24 ENCOUNTER — Other Ambulatory Visit: Payer: Self-pay | Admitting: Physician Assistant

## 2016-12-24 DIAGNOSIS — E119 Type 2 diabetes mellitus without complications: Secondary | ICD-10-CM

## 2016-12-27 ENCOUNTER — Encounter: Payer: Self-pay | Admitting: Physician Assistant

## 2016-12-27 DIAGNOSIS — Z86010 Personal history of colonic polyps: Secondary | ICD-10-CM | POA: Insufficient documentation

## 2017-01-02 ENCOUNTER — Telehealth: Payer: Self-pay | Admitting: Physician Assistant

## 2017-01-02 DIAGNOSIS — J441 Chronic obstructive pulmonary disease with (acute) exacerbation: Secondary | ICD-10-CM

## 2017-01-02 MED ORDER — AZELASTINE HCL 0.1 % NA SOLN: 2.0000 | Freq: Two times a day (BID) | NASAL | 3 refills | Status: DC

## 2017-01-02 NOTE — Telephone Encounter (Signed)
Pharmacy requests 90-day supply.  Meds ordered this encounter  Medications  . azelastine (ASTELIN) 0.1 % nasal spray    Sig: Place 2 sprays into both nostrils 2 (two) times daily. Use in each nostril as directed    Dispense:  90 mL    Refill:  3    Order Specific Question:   Supervising Provider    Answer:   Brigitte Pulse, EVA N [[id number]]

## 2017-01-05 ENCOUNTER — Other Ambulatory Visit: Payer: Self-pay

## 2017-01-05 ENCOUNTER — Ambulatory Visit (INDEPENDENT_AMBULATORY_CARE_PROVIDER_SITE_OTHER): Payer: Medicare Other | Admitting: Emergency Medicine

## 2017-01-05 ENCOUNTER — Encounter: Payer: Self-pay | Admitting: Emergency Medicine

## 2017-01-05 VITALS — BP 136/80 | HR 97 | Temp 98.4°F | Resp 17 | Ht 72.0 in | Wt 241.4 lb

## 2017-01-05 DIAGNOSIS — R109 Unspecified abdominal pain: Secondary | ICD-10-CM | POA: Diagnosis not present

## 2017-01-05 DIAGNOSIS — N281 Cyst of kidney, acquired: Secondary | ICD-10-CM

## 2017-01-05 LAB — POCT URINALYSIS DIP (MANUAL ENTRY)
BILIRUBIN UA: NEGATIVE
GLUCOSE UA: NEGATIVE mg/dL
Ketones, POC UA: NEGATIVE mg/dL
LEUKOCYTES UA: NEGATIVE
NITRITE UA: NEGATIVE
Protein Ur, POC: NEGATIVE mg/dL
RBC UA: NEGATIVE
Spec Grav, UA: 1.02 (ref 1.010–1.025)
Urobilinogen, UA: 0.2 E.U./dL
pH, UA: 5 (ref 5.0–8.0)

## 2017-01-05 MED ORDER — HYDROCODONE-ACETAMINOPHEN 5-325 MG PO TABS: 1.0000 | ORAL_TABLET | Freq: Four times a day (QID) | ORAL | 0 refills | Status: DC | PRN

## 2017-01-05 NOTE — Patient Instructions (Addendum)
     IF you received an x-ray today, you will receive an invoice from [facility]. Please contact [facility] at [phone] with questions or concerns regarding your invoice.   IF you received labwork today, you will receive an invoice from [facility]. Please contact [facility] at [phone] with questions or concerns regarding your invoice.   Our billing staff will not be able to assist you with questions regarding bills from these companies.  You will be contacted with the lab results as soon as they are available. The fastest way to get your results is to activate your My Chart account. Instructions are located on the last page of this paperwork. If you have not heard from [place] regarding the results in 2 weeks, please contact this office.    Flank Pain Flank pain is pain in your side. The flank is the area of your side between your upper belly (abdomen) and your back. The pain may occur over a short period of time (acute) or may be long-term or come back often (chronic). It may be mild or very bad. Pain in this area can be caused by many different things. Follow these instructions at home:  Rest as told by your doctor.  Drink enough fluid to keep your pee (urine) clear or pale yellow.  Take over-the-counter and prescription medicines only as told by your doctor.  Keep all follow-up visits as told by your doctor. This is important. Contact a doctor if:  Medicine does not help your pain.  You have new symptoms.  Your pain gets worse.  You have a fever.  Your symptoms last longer than 2-3 days. Get help right away if:  Your tummy hurts or is swollen.  You are short of breath.  You feel sick to your stomach (nauseous) and it does not go away.  You cannot stop throwing up (vomiting).  You feel like you will pass out or you do pass out (faint).  You have blood in your pee.  You have a fever and your symptoms suddenly get worse. This information is not  intended to replace advice given to you by your health care provider. Make sure you discuss any questions you have with your health care provider. Document Released: 10/12/2007 Document Revised: 09/24/2015 Document Reviewed: 10/06/2014 Elsevier Interactive Patient Education  2018 [employer].
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
## 2017-01-17 NOTE — Telephone Encounter (Signed)
Done

## 2017-01-29 ENCOUNTER — Telehealth: Payer: Self-pay

## 2017-01-29 NOTE — Telephone Encounter (Signed)
Copied from [place] [id number]. Topic: Inquiry >> Jan 29, 2017  2:19 PM Moton, Claiborne Billings, [place] wrote: Reason for CRM: Patients wife called because her husband wanted to have some blood work done but there are no orders. She stated that she didn't know which ones he wanted to have done. If someone from the office could give them a call back at [phone]

## 2017-02-01 NOTE — Telephone Encounter (Signed)
LVM to call back and Crm placed.

## 2017-02-02 DIAGNOSIS — R809 Proteinuria, unspecified: Secondary | ICD-10-CM | POA: Diagnosis not present

## 2017-02-02 DIAGNOSIS — D472 Monoclonal gammopathy: Secondary | ICD-10-CM | POA: Diagnosis not present

## 2017-02-02 DIAGNOSIS — N183 Chronic kidney disease, stage 3 (moderate): Secondary | ICD-10-CM | POA: Diagnosis not present

## 2017-02-04 ENCOUNTER — Other Ambulatory Visit: Payer: Self-pay | Admitting: Nephrology

## 2017-02-04 DIAGNOSIS — N281 Cyst of kidney, acquired: Secondary | ICD-10-CM

## 2017-02-06 ENCOUNTER — Other Ambulatory Visit: Payer: Self-pay | Admitting: Family Medicine

## 2017-02-06 ENCOUNTER — Telehealth: Payer: Self-pay | Admitting: Physician Assistant

## 2017-02-06 DIAGNOSIS — R05 Cough: Secondary | ICD-10-CM

## 2017-02-06 DIAGNOSIS — R059 Cough, unspecified: Secondary | ICD-10-CM

## 2017-02-06 DIAGNOSIS — F17200 Nicotine dependence, unspecified, uncomplicated: Secondary | ICD-10-CM

## 2017-02-06 MED ORDER — ALBUTEROL SULFATE HFA 108 (90 BASE) MCG/ACT IN AERS: 2.0000 | INHALATION_SPRAY | Freq: Four times a day (QID) | RESPIRATORY_TRACT | 3 refills | Status: DC | PRN

## 2017-02-06 NOTE — Telephone Encounter (Signed)
Albuterol Inhaler refilled per pt. request.  (last ov 11/17/16)

## 2017-02-06 NOTE — Telephone Encounter (Signed)
Copied from [place] [id number]. Topic: Quick Communication - Rx Refill/Question >> Feb 06, 2017  8:33 AM Synthia Innocent wrote: Medication: albuterol (PROAIR HFA) 108 (90 Base) MCG/ACT inhaler   Has the patient contacted their pharmacy? Yes.     (Agent: If no, request that the patient contact the pharmacy for the refill.)   Preferred Pharmacy (with phone number or street name): [facility]: Please be advised that RX refills may take up to 3 business days. We ask that you follow-up with your pharmacy.

## 2017-02-09 DIAGNOSIS — N183 Chronic kidney disease, stage 3 (moderate): Secondary | ICD-10-CM | POA: Diagnosis not present

## 2017-02-09 DIAGNOSIS — N281 Cyst of kidney, acquired: Secondary | ICD-10-CM | POA: Diagnosis not present

## 2017-02-09 DIAGNOSIS — D472 Monoclonal gammopathy: Secondary | ICD-10-CM | POA: Diagnosis not present

## 2017-02-09 DIAGNOSIS — I129 Hypertensive chronic kidney disease with stage 1 through stage 4 chronic kidney disease, or unspecified chronic kidney disease: Secondary | ICD-10-CM | POA: Diagnosis not present

## 2017-02-10 ENCOUNTER — Ambulatory Visit
Admission: RE | Admit: 2017-02-10 | Discharge: 2017-02-10 | Disposition: A | Discharge status: Home / Self Care | Payer: BLUE CROSS/BLUE SHIELD | Source: Ambulatory Visit | Attending: Nephrology | Admitting: Nephrology

## 2017-02-10 DIAGNOSIS — N281 Cyst of kidney, acquired: Secondary | ICD-10-CM

## 2017-02-11 ENCOUNTER — Other Ambulatory Visit: Payer: Self-pay | Admitting: Gastroenterology

## 2017-02-19 ENCOUNTER — Encounter: Payer: Self-pay | Admitting: Physician Assistant

## 2017-02-19 DIAGNOSIS — N183 Chronic kidney disease, stage 3 unspecified: Secondary | ICD-10-CM

## 2017-02-19 DIAGNOSIS — N281 Cyst of kidney, acquired: Secondary | ICD-10-CM

## 2017-04-02 ENCOUNTER — Other Ambulatory Visit: Payer: Self-pay | Admitting: Physician Assistant

## 2017-04-02 DIAGNOSIS — E119 Type 2 diabetes mellitus without complications: Secondary | ICD-10-CM

## 2017-04-14 ENCOUNTER — Telehealth: Payer: Self-pay | Admitting: Physician Assistant

## 2017-04-14 NOTE — Telephone Encounter (Signed)
Pt.'s wife called to request a refill for atorvastatin (Lipitor). Pt. Unsure about purpose of medication. A visit has been scheduled on 04/27/17 incase it is needed. If the visit is needed pt requests a refill be made to last him to the appt. If unneeded pt. Requests the prescription be filled and the appt. Canceled.   Best Number [phone]

## 2017-04-14 NOTE — Telephone Encounter (Signed)
#  30 sent in.  Pt needs to be seen for further refills

## 2017-04-23 ENCOUNTER — Encounter: Payer: Self-pay | Admitting: Physician Assistant

## 2017-04-27 ENCOUNTER — Ambulatory Visit (INDEPENDENT_AMBULATORY_CARE_PROVIDER_SITE_OTHER): Payer: Medicare Other | Admitting: Physician Assistant

## 2017-04-27 ENCOUNTER — Ambulatory Visit (INDEPENDENT_AMBULATORY_CARE_PROVIDER_SITE_OTHER): Payer: Medicare Other

## 2017-04-27 ENCOUNTER — Other Ambulatory Visit: Payer: Self-pay

## 2017-04-27 ENCOUNTER — Encounter: Payer: Self-pay | Admitting: Physician Assistant

## 2017-04-27 VITALS — BP 130/78 | HR 91 | Temp 99.1°F | Resp 18 | Ht 73.82 in | Wt 237.4 lb

## 2017-04-27 DIAGNOSIS — E1165 Type 2 diabetes mellitus with hyperglycemia: Secondary | ICD-10-CM

## 2017-04-27 DIAGNOSIS — N183 Chronic kidney disease, stage 3 unspecified: Secondary | ICD-10-CM

## 2017-04-27 DIAGNOSIS — I10 Essential (primary) hypertension: Secondary | ICD-10-CM

## 2017-04-27 DIAGNOSIS — F17200 Nicotine dependence, unspecified, uncomplicated: Secondary | ICD-10-CM | POA: Diagnosis not present

## 2017-04-27 DIAGNOSIS — E119 Type 2 diabetes mellitus without complications: Secondary | ICD-10-CM | POA: Diagnosis not present

## 2017-04-27 DIAGNOSIS — M545 Low back pain: Secondary | ICD-10-CM | POA: Diagnosis not present

## 2017-04-27 DIAGNOSIS — G8929 Other chronic pain: Secondary | ICD-10-CM

## 2017-04-27 DIAGNOSIS — E785 Hyperlipidemia, unspecified: Secondary | ICD-10-CM

## 2017-04-27 DIAGNOSIS — J449 Chronic obstructive pulmonary disease, unspecified: Secondary | ICD-10-CM

## 2017-04-27 MED ORDER — BENZONATATE 100 MG PO CAPS: 100.0000 mg | ORAL_CAPSULE | Freq: Two times a day (BID) | ORAL | 2 refills | Status: DC | PRN

## 2017-04-27 MED ORDER — DICYCLOMINE HCL 10 MG PO CAPS: ORAL_CAPSULE | ORAL | 3 refills | Status: DC

## 2017-04-27 MED ORDER — ATORVASTATIN CALCIUM 20 MG PO TABS: 20.0000 mg | ORAL_TABLET | Freq: Every morning | ORAL | 0 refills | Status: DC

## 2017-04-27 NOTE — Assessment & Plan Note (Signed)
Await labs. Adjust regimen as indicated by results. If A1C >7%, will start medication. Caution with metformin, due to CKD.

## 2017-04-27 NOTE — Assessment & Plan Note (Signed)
Await labs.

## 2017-04-27 NOTE — Assessment & Plan Note (Signed)
Controlled. Continue DiovanHCT 320/25 daily. Encouraged smoking cessation.

## 2017-04-27 NOTE — Assessment & Plan Note (Signed)
Congratulated on cutting back. Continue efforts to quit smoking.

## 2017-04-27 NOTE — Patient Instructions (Addendum)
Pay attention to the unsteadiness. See if you can identify anything that may bring it on consistently.  Try to cut back to 2 cigarettes/day.  Think about scheduling a massage!  If you choose to stay at [facility] care [facility], I recommend Dr. Nolon Rod or Dr. Mitchel Honour, as you have seen them recently. Either way, you should be rechecked in 3 months.    IF you received an x-ray today, you will receive an invoice from [facility]. Please contact [facility] at [phone] with questions or concerns regarding your invoice.   IF you received labwork today, you will receive an invoice from [facility]. Please contact [facility] at [phone] with questions or concerns regarding your invoice.   Our billing staff will not be able to assist you with questions regarding bills from these companies.  You will be contacted with the lab results as soon as they are available. The fastest way to get your results is to activate your My Chart account. Instructions are located on the last page of this paperwork. If you have not heard from [place] regarding the results in 2 weeks, please contact this office.

## 2017-04-27 NOTE — Assessment & Plan Note (Signed)
Stable. Encouraged smoking cessation.

## 2017-04-27 NOTE — Progress Notes (Signed)
Patient ID: John Blake, male    DOB: 1945-07-23, 72 y.o.   MRN: [id number]  PCP: Harrison Mons, PA-C  Chief Complaint  Patient presents with  . Diabetes    f/u  . Medication Refill    atorvastatin    Subjective:   Presents for evaluation of diabetes, HTN and hyperlipidemia.  Feels well. No specific problems or concerns. Tolerating medications without adverse effects..  1 pack/week now. Doesn't smoke at work, but at home, when idle, he does.    Review of Systems  Constitutional: Negative for activity change, appetite change, fatigue and unexpected weight change.  HENT: Negative for congestion, dental problem, ear pain, hearing loss, mouth sores, postnasal drip, rhinorrhea, sneezing, sore throat, tinnitus and trouble swallowing.   Eyes: Negative for photophobia, pain, redness and visual disturbance.  Respiratory: Positive for cough (with AM phlegm). Negative for chest tightness and shortness of breath.   Cardiovascular: Negative for chest pain, palpitations and leg swelling.  Gastrointestinal: Negative for abdominal pain, blood in stool, constipation, diarrhea, nausea and vomiting.  Endocrine: Negative for cold intolerance, heat intolerance, polydipsia, polyphagia and polyuria.  Genitourinary: Negative for dysuria, frequency, hematuria and urgency.  Musculoskeletal: Positive for arthralgias (knees feel "unsteady") and back pain (first thing in the AM, again when getting up after prolonged sitting. RIGHT sided. Improves with moving around and acetaminophen.). Negative for gait problem, myalgias and neck stiffness.  Skin: Negative for rash.  Neurological: Negative for dizziness, speech difficulty, weakness, light-headedness, numbness and headaches.  Hematological: Negative for adenopathy.  Psychiatric/Behavioral: Negative for confusion and sleep disturbance. The patient is not nervous/anxious.        Patient Active Problem List   Diagnosis Date Noted  . Flank  pain 01/05/2017  . History of colonic polyps 12/27/2016  . Chronic left shoulder pain 10/20/2016  . Bilateral renal cysts 08/02/2016  . 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 COPD (chronic obstructive pulmonary disease) ([place]) 07/14/2016  . Smoker 07/14/2016  . Chronic seasonal allergic rhinitis 07/14/2016  . CKD (chronic kidney disease) stage 3, GFR 30-59 ml/min (HCC) 05/30/2016  . Essential hypertension 02/05/2015  . Hyperlipidemia 02/05/2015  . BMI 34.0-34.9,adult 02/05/2015  . Type 2 diabetes mellitus without complication ([place]) 40/98/1191  . Shoulder impingement syndrome 06/17/2014     Prior to Admission medications   Medication Sig Start Date End Date Taking? Authorizing Provider  acetaminophen (TYLENOL) 325 MG tablet Take 325 mg by mouth 2 (two) times daily.   Yes [provider]  albuterol (PROAIR HFA) 108 (90 Base) MCG/ACT inhaler Inhale 2 puffs into the lungs every 6 (six) hours as needed for wheezing or shortness of breath. 02/06/17  Yes Ameisha Mcclellan, PA-C  atorvastatin (LIPITOR) 20 MG tablet Take 1 tablet (20 mg total) by mouth every morning. Office visit needed 04/02/17  Yes Aleese Kamps, PA-C  azelastine (ASTELIN) 0.1 % nasal spray Place 2 sprays into both nostrils 2 (two) times daily. Use in each nostril as directed 01/02/17  Yes Jackquline Branca, PA-C  benzonatate (TESSALON) 100 MG capsule TAKE 1 CAPSULE (100 MG TOTAL) BY MOUTH 2 (TWO) TIMES DAILY AS NEEDED FOR COUGH. 11/15/16  Yes Stallings, Zoe A, MD  dicyclomine (BENTYL) 10 MG capsule Take 1 tab by mouth every 8 hours as needed for abdominal pain. 09/15/16  Yes Armbruster, Carlota Raspberry, MD  diphenhydrAMINE (BENADRYL) 25 MG tablet Take 25 mg by mouth every 6 (six) hours as needed.   Yes [provider]  fluticasone (  FLONASE) 50 MCG/ACT nasal spray Place 2 sprays into both nostrils daily. 02/12/16  Yes Camilah Spillman, PA-C  Fluticasone-Salmeterol (ADVAIR DISKUS) 250-50  MCG/DOSE AEPB Inhale 1 puff into the lungs 2 (two) times daily. 03/17/16  Yes Stallings, Zoe A, MD  glucose blood test strip Use to test blood sugar once daily. Dx: E11.9 06/28/15  Yes [name], MD  HYDROcodone-acetaminophen (NORCO) 5-325 MG tablet Take 1 tablet by mouth every 6 (six) hours as needed for moderate pain. 01/05/17  Yes Sagardia, Ines Bloomer, MD  HYDROcodone-acetaminophen (NORCO) 5-325 MG tablet Take 1 tablet by mouth every 6 (six) hours as needed for moderate pain. 01/05/17  Yes Horald Pollen, MD  Lancets MISC Use for home glucose monitoring 01/21/15  Yes Newman Waren, PA-C  Na Sulfate-K Sulfate-Mg Sulf 17.5-3.13-1.6 GM/180ML SOLN Take as directed for colonoscopy prep. 09/15/16  Yes Armbruster, Carlota Raspberry, MD  omeprazole (PRILOSEC) 40 MG capsule TAKE 1 CAPSULE BY MOUTH TWICE A DAY 02/12/17  Yes Armbruster, Carlota Raspberry, MD  ONE TOUCH LANCETS MISC Use for home glucose monitoring 01/29/15  Yes Delmas Faucett, PA-C  valsartan-hydrochlorothiazide (DIOVAN-HCT) 320-25 MG tablet TAKE 1 TABLET BY MOUTH EVERY DAY IN THE MORNING 10/20/16  Yes Sagardia, Ines Bloomer, MD  varenicline (CHANTIX CONTINUING MONTH PAK) 1 MG tablet Take 1 tablet (1 mg total) by mouth 2 (two) times daily. 01/28/16  Yes Forrest Moron, MD     Allergies  Allergen Reactions  . Nsaids     Kidney disease       Objective:  Physical Exam  Constitutional: He is oriented to person, place, and time. He appears well-developed and well-nourished. He is active and cooperative. No distress.  BP 130/78 (BP Location: Right Arm, Patient Position: Sitting, Cuff Size: Large)   Pulse 91   Temp 99.1 F (37.3 C) (Oral)   Resp 18   Ht 6' 1.82" (1.875 m)   Wt 237 lb 6.4 oz (107.7 kg)   SpO2 98%   BMI 30.63 kg/m   HENT:  Head: Normocephalic and atraumatic.  Right Ear: Hearing normal.  Left Ear: Hearing normal.  Eyes: Conjunctivae are normal. No scleral icterus.  Neck: Normal range of motion. Neck supple. No thyromegaly  present.  Cardiovascular: Normal rate, regular rhythm and normal heart sounds.  Pulses:      Radial pulses are 2+ on the right side, and 2+ on the left side.  Pulmonary/Chest: Effort normal and breath sounds normal.  Musculoskeletal:       Cervical back: Normal.       Thoracic back: Normal.       Lumbar back: He exhibits pain. He exhibits normal range of motion, no tenderness (reports that palpation of the low back muscles feels good, no increased pain with palpation), no bony tenderness, no swelling, no edema, no deformity, no laceration, no spasm and normal pulse.  Lymphadenopathy:       Head (right side): No tonsillar, no preauricular, no posterior auricular and no occipital adenopathy present.       Head (left side): No tonsillar, no preauricular, no posterior auricular and no occipital adenopathy present.    He has no cervical adenopathy.       Right: No supraclavicular adenopathy present.       Left: No supraclavicular adenopathy present.  Neurological: He is alert and oriented to person, place, and time. No sensory deficit.  Skin: Skin is warm, dry and intact. No rash noted. No cyanosis or erythema. Nails show no clubbing.  Psychiatric: He has a normal mood and affect. His speech is normal and behavior is normal.   Wt Readings from Last 3 Encounters:  04/27/17 237 lb 6.4 oz (107.7 kg)  01/05/17 241 lb 6.4 oz (109.5 kg)  12/15/16 237 lb (107.5 kg)       Assessment & Plan:   Problem List Items Addressed This Visit    Type 2 diabetes mellitus with hyperglycemia, without long-term current use of insulin ([place]) - Primary    Await labs. Adjust regimen as indicated by results. If A1C >7%, will start medication. Caution with metformin, due to CKD.      Relevant Medications   atorvastatin (LIPITOR) 20 MG tablet   Essential hypertension    Controlled. Continue DiovanHCT 320/25 daily. Encouraged smoking cessation.      Relevant Medications   amLODipine (NORVASC) 5 MG tablet    atorvastatin (LIPITOR) 20 MG tablet   Hyperlipidemia    Await labs. Adjust regimen as indicated by results. LDL goal <70. Increase atorvastatin dose from 20 mg to 40 mg if needed.      Relevant Medications   amLODipine (NORVASC) 5 MG tablet   atorvastatin (LIPITOR) 20 MG tablet   Other Relevant Orders   Lipid panel   CKD (chronic kidney disease) stage 3, GFR 30-59 ml/min (HCC)    Await labs.       Relevant Orders   Microalbumin / creatinine urine ratio   Comprehensive metabolic panel   COPD (chronic obstructive pulmonary disease) (HCC)    Stable. Encouraged smoking cessation.      Relevant Medications   benzonatate (TESSALON) 100 MG capsule   Smoker    Congratulated on cutting back. Continue efforts to quit smoking.       Other Visit Diagnoses    Chronic right-sided low back pain without sciatica       Relevant Orders   DG Lumbar Spine Complete (Completed)       Return in about 3 months (around 07/27/2017) for re-evaluation of diabetes, COPD.   Fara Chute, PA-C [facility]

## 2017-04-27 NOTE — Assessment & Plan Note (Signed)
Await labs. Adjust regimen as indicated by results. LDL goal <70. Increase atorvastatin dose from 20 mg to 40 mg if needed.

## 2017-04-28 LAB — COMPREHENSIVE METABOLIC PANEL
ALK PHOS: 52 IU/L (ref 39–117)
ALT: 17 IU/L (ref 0–44)
AST: 18 IU/L (ref 0–40)
Albumin/Globulin Ratio: 1.7 (ref 1.2–2.2)
Albumin: 4.3 g/dL (ref 3.6–4.8)
BILIRUBIN TOTAL: 0.5 mg/dL (ref 0.0–1.2)
BUN/Creatinine Ratio: 17 (ref 10–24)
BUN: 26 mg/dL (ref 8–27)
CHLORIDE: 104 mmol/L (ref 96–106)
CO2: 19 mmol/L — AB (ref 20–29)
CREATININE: 1.55 mg/dL — AB (ref 0.76–1.27)
Calcium: 9.3 mg/dL (ref 8.6–10.2)
GFR calc Af Amer: 52 mL/min/{1.73_m2} — ABNORMAL LOW (ref >59)
GFR calc non Af Amer: 45 mL/min/{1.73_m2} — ABNORMAL LOW (ref >59)
GLUCOSE: 122 mg/dL — AB (ref 65–99)
Globulin, Total: 2.5 g/dL (ref 1.5–4.5)
Potassium: 4.1 mmol/L (ref 3.5–5.2)
Sodium: 142 mmol/L (ref 134–144)
Total Protein: 6.8 g/dL (ref 6.0–8.5)

## 2017-04-28 LAB — CBC WITH DIFFERENTIAL/PLATELET
BASOS ABS: 0 10*3/uL (ref 0.0–0.2)
BASOS: 0 %
EOS (ABSOLUTE): 0.2 10*3/uL (ref 0.0–0.4)
Eos: 4 %
HEMOGLOBIN: 13.7 g/dL (ref 13.0–17.7)
Hematocrit: 38.5 % (ref 37.5–51.0)
IMMATURE GRANS (ABS): 0 10*3/uL (ref 0.0–0.1)
IMMATURE GRANULOCYTES: 0 %
LYMPHS: 34 %
Lymphocytes Absolute: 2.2 10*3/uL (ref 0.7–3.1)
MCH: 33.5 pg — ABNORMAL HIGH (ref 26.6–33.0)
MCHC: 35.6 g/dL (ref 31.5–35.7)
MCV: 94 fL (ref 79–97)
MONOCYTES: 6 %
Monocytes Absolute: 0.4 10*3/uL (ref 0.1–0.9)
NEUTROS ABS: 3.7 10*3/uL (ref 1.4–7.0)
NEUTROS PCT: 56 %
PLATELETS: 204 10*3/uL (ref 150–379)
RBC: 4.09 x10E6/uL — ABNORMAL LOW (ref 4.14–5.80)
RDW: 13.3 % (ref 12.3–15.4)
WBC: 6.6 10*3/uL (ref 3.4–10.8)

## 2017-04-28 LAB — LIPID PANEL
CHOLESTEROL TOTAL: 110 mg/dL (ref 100–199)
Chol/HDL Ratio: 2.2 ratio (ref 0.0–5.0)
HDL: 49 mg/dL (ref >39)
Triglycerides: 328 mg/dL — ABNORMAL HIGH (ref 0–149)
VLDL CHOLESTEROL CAL: 66 mg/dL — AB (ref 5–40)

## 2017-04-28 LAB — MICROALBUMIN / CREATININE URINE RATIO
CREATININE, UR: 183.1 mg/dL
MICROALB/CREAT RATIO: 9.8 mg/g{creat} (ref 0.0–30.0)
Microalbumin, Urine: 17.9 ug/mL

## 2017-04-28 LAB — HEMOGLOBIN A1C
ESTIMATED AVERAGE GLUCOSE: 131 mg/dL
HEMOGLOBIN A1C: 6.2 % — AB (ref 4.8–5.6)

## 2017-05-07 ENCOUNTER — Other Ambulatory Visit: Payer: Self-pay | Admitting: Physician Assistant

## 2017-05-07 ENCOUNTER — Telehealth: Payer: Self-pay | Admitting: Physician Assistant

## 2017-05-07 DIAGNOSIS — E119 Type 2 diabetes mellitus without complications: Secondary | ICD-10-CM

## 2017-05-07 NOTE — Telephone Encounter (Signed)
Result note now documented, and routed to lab pool to contact patient.

## 2017-05-07 NOTE — Telephone Encounter (Signed)
Patient's wife is calling for results- imaging results reviewed and she will discuss with patient and see if he would like a referral.  Lab results do not have comment- could not review with her.    Results not in basket.

## 2017-05-07 NOTE — Telephone Encounter (Signed)
Please advise 

## 2017-05-28 ENCOUNTER — Other Ambulatory Visit: Payer: Self-pay | Admitting: Physician Assistant

## 2017-05-28 DIAGNOSIS — J449 Chronic obstructive pulmonary disease, unspecified: Secondary | ICD-10-CM

## 2017-05-28 NOTE — Telephone Encounter (Signed)
Copied from [place] [id number]. Topic: Quick Communication - Rx Refill/Question >> May 28, 2017 10:52 AM Synthia Innocent wrote: Medication: benzonatate (TESSALON) 100 MG capsule  Has the patient contacted their pharmacy? Yes.   (Agent: If no, request that the patient contact the pharmacy for the refill.) Preferred Pharmacy (with phone number or street name): [facility]: Please be advised that RX refills may take up to 3 business days. We ask that you follow-up with your pharmacy.

## 2017-05-29 NOTE — Telephone Encounter (Signed)
LOV 04-27-17 with Daphane Shepherd /  Disp Refills Start End   benzonatate (TESSALON) 100 MG capsule 20 capsule 2 04/27/2017    Sig - Route: Take 1 capsule (100 mg total) by mouth 2 (two) times daily as needed for cough. - Oral / Called pharmacy and  verified that there are no remaining refills. / Will forward to provider to approve as this was written PRN /

## 2017-05-30 MED ORDER — BENZONATATE 100 MG PO CAPS: 100.0000 mg | ORAL_CAPSULE | Freq: Two times a day (BID) | ORAL | 2 refills | Status: DC | PRN

## 2017-05-30 NOTE — Telephone Encounter (Signed)
Please advise. Dgaddy, CMA 

## 2017-05-30 NOTE — Telephone Encounter (Signed)
Meds ordered this encounter  Medications  . benzonatate (TESSALON) 100 MG capsule    Sig: Take 1 capsule (100 mg total) by mouth 2 (two) times daily as needed for cough.    Dispense:  20 capsule    Refill:  2

## 2017-06-01 ENCOUNTER — Encounter: Payer: Self-pay | Admitting: Family Medicine

## 2017-06-01 ENCOUNTER — Ambulatory Visit (INDEPENDENT_AMBULATORY_CARE_PROVIDER_SITE_OTHER): Payer: BLUE CROSS/BLUE SHIELD | Admitting: Family Medicine

## 2017-06-01 ENCOUNTER — Other Ambulatory Visit: Payer: Self-pay

## 2017-06-01 VITALS — BP 134/86 | HR 92 | Temp 99.6°F | Ht 76.0 in | Wt 233.0 lb

## 2017-06-01 DIAGNOSIS — J0100 Acute maxillary sinusitis, unspecified: Secondary | ICD-10-CM

## 2017-06-01 MED ORDER — AMOXICILLIN-POT CLAVULANATE 875-125 MG PO TABS: 1.0000 | ORAL_TABLET | Freq: Two times a day (BID) | ORAL | 0 refills | Status: DC

## 2017-06-01 NOTE — Patient Instructions (Addendum)
     IF you received an x-ray today, you will receive an invoice from [facility]. Please contact [facility] at [phone] with questions or concerns regarding your invoice.   IF you received labwork today, you will receive an invoice from [facility]. Please contact [facility] at [phone] with questions or concerns regarding your invoice.   Our billing staff will not be able to assist you with questions regarding bills from these companies.  You will be contacted with the lab results as soon as they are available. The fastest way to get your results is to activate your My Chart account. Instructions are located on the last page of this paperwork. If you have not heard from us regarding the results in 2 weeks, please contact this office.     Sinusitis, Adult Sinusitis is soreness and inflammation of your sinuses. Sinuses are hollow spaces in the bones around your face. They are located:  Around your eyes.  In the middle of your forehead.  Behind your nose.  In your cheekbones.  Your sinuses and nasal passages are lined with a stringy fluid (mucus). Mucus normally drains out of your sinuses. When your nasal tissues get inflamed or swollen, the mucus can get trapped or blocked so air cannot flow through your sinuses. This lets bacteria, viruses, and funguses grow, and that leads to infection. Follow these instructions at home: Medicines  Take, use, or apply over-the-counter and prescription medicines only as told by your doctor. These may include nasal sprays.  If you were prescribed an antibiotic medicine, take it as told by your doctor. Do not stop taking the antibiotic even if you start to feel better. Hydrate and Humidify  Drink enough water to keep your pee (urine) clear or pale yellow.  Use a cool mist humidifier to keep the humidity level in your home above 50%.  Breathe in steam for 10-15 minutes, 3-4 times a day or as told by your doctor. You can do  this in the bathroom while a hot shower is running.  Try not to spend time in cool or dry air. Rest  Rest as much as possible.  Sleep with your head raised (elevated).  Make sure to get enough sleep each night. General instructions  Put a warm, moist washcloth on your face 3-4 times a day or as told by your doctor. This will help with discomfort.  Wash your hands often with soap and water. If there is no soap and water, use hand sanitizer.  Do not smoke. Avoid being around people who are smoking (secondhand smoke).  Keep all follow-up visits as told by your doctor. This is important. Contact a doctor if:  You have a fever.  Your symptoms get worse.  Your symptoms do not get better within 10 days. Get help right away if:  You have a very bad headache.  You cannot stop throwing up (vomiting).  You have pain or swelling around your face or eyes.  You have trouble seeing.  You feel confused.  Your neck is stiff.  You have trouble breathing. This information is not intended to replace advice given to you by your health care provider. Make sure you discuss any questions you have with your health care provider. Document Released: 06/21/2007 Document Revised: 08/29/2015 Document Reviewed: 10/28/2014 Elsevier Interactive Patient Education  2018 Elsevier Inc.  
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
## 2017-08-23 ENCOUNTER — Telehealth: Payer: Self-pay | Admitting: Family Medicine

## 2017-08-23 ENCOUNTER — Other Ambulatory Visit: Payer: Self-pay | Admitting: Gastroenterology

## 2017-08-23 NOTE — Telephone Encounter (Signed)
Called and spoke with patient's wife (per DPR). Confirmed appointment with wife, advised of time, building number and late policy.

## 2017-08-24 ENCOUNTER — Encounter: Payer: Self-pay | Admitting: Family Medicine

## 2017-08-24 ENCOUNTER — Ambulatory Visit (INDEPENDENT_AMBULATORY_CARE_PROVIDER_SITE_OTHER): Payer: BLUE CROSS/BLUE SHIELD | Admitting: Family Medicine

## 2017-08-24 ENCOUNTER — Other Ambulatory Visit: Payer: Self-pay

## 2017-08-24 VITALS — BP 147/74 | HR 98 | Temp 98.1°F | Resp 16 | Ht 73.0 in | Wt 228.8 lb

## 2017-08-24 DIAGNOSIS — E119 Type 2 diabetes mellitus without complications: Secondary | ICD-10-CM | POA: Diagnosis not present

## 2017-08-24 DIAGNOSIS — I10 Essential (primary) hypertension: Secondary | ICD-10-CM

## 2017-08-24 DIAGNOSIS — N183 Chronic kidney disease, stage 3 unspecified: Secondary | ICD-10-CM

## 2017-08-24 DIAGNOSIS — E782 Mixed hyperlipidemia: Secondary | ICD-10-CM

## 2017-08-24 DIAGNOSIS — R059 Cough, unspecified: Secondary | ICD-10-CM

## 2017-08-24 DIAGNOSIS — J410 Simple chronic bronchitis: Secondary | ICD-10-CM

## 2017-08-24 DIAGNOSIS — F17200 Nicotine dependence, unspecified, uncomplicated: Secondary | ICD-10-CM | POA: Diagnosis not present

## 2017-08-24 DIAGNOSIS — R05 Cough: Secondary | ICD-10-CM | POA: Diagnosis not present

## 2017-08-24 DIAGNOSIS — Z760 Encounter for issue of repeat prescription: Secondary | ICD-10-CM | POA: Diagnosis not present

## 2017-08-24 DIAGNOSIS — E1165 Type 2 diabetes mellitus with hyperglycemia: Secondary | ICD-10-CM

## 2017-08-24 MED ORDER — OMEPRAZOLE 40 MG PO CPDR: 40.0000 mg | DELAYED_RELEASE_CAPSULE | Freq: Two times a day (BID) | ORAL | 0 refills | Status: DC

## 2017-08-24 MED ORDER — ALBUTEROL SULFATE HFA 108 (90 BASE) MCG/ACT IN AERS: 2.0000 | INHALATION_SPRAY | Freq: Four times a day (QID) | RESPIRATORY_TRACT | 3 refills | Status: DC | PRN

## 2017-08-24 MED ORDER — FLUTICASONE-SALMETEROL 115-21 MCG/ACT IN AERO: 2.0000 | INHALATION_SPRAY | Freq: Two times a day (BID) | RESPIRATORY_TRACT | 0 refills | Status: DC

## 2017-08-24 MED ORDER — AZELASTINE HCL 0.1 % NA SOLN: 2.0000 | Freq: Two times a day (BID) | NASAL | 3 refills | Status: DC

## 2017-08-24 MED ORDER — ATORVASTATIN CALCIUM 20 MG PO TABS: 20.0000 mg | ORAL_TABLET | Freq: Every morning | ORAL | 3 refills | Status: DC
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
## 2017-08-24 NOTE — Patient Instructions (Addendum)
Call you insurance and ask them if mail order would be cheaper  Ask them which inhaler is covered for COPD  Advair, Dulera, Symbicort, Spiriva   IF you received an x-ray today, you will receive an invoice from [facility]. Please contact [facility] at [phone] with questions or concerns regarding your invoice.   IF you received labwork today, you will receive an invoice from [place]. Please contact [facility] at [phone] with questions or concerns regarding your invoice.   Our billing staff will not be able to assist you with questions regarding bills from these companies.  You will be contacted with the lab results as soon as they are available. The fastest way to get your results is to activate your My Chart account. Instructions are located on the last page of this paperwork. If you have not heard from [place] regarding the results in 2 weeks, please contact this office.      Blood Glucose Monitoring, Adult Monitoring your blood sugar (glucose) helps you manage your diabetes. It also helps you and your health care provider determine how well your diabetes management plan is working. Blood glucose monitoring involves checking your blood glucose as often as directed, and keeping a record (log) of your results over time. Why should I monitor my blood glucose? Checking your blood glucose regularly can:  Help you understand how food, exercise, illnesses, and medicines affect your blood glucose.  Let you know what your blood glucose is at any time. You can quickly tell if you are having low blood glucose (hypoglycemia) or high blood glucose (hyperglycemia).  Help you and your health care provider adjust your medicines as needed.  When should I check my blood glucose? Follow instructions from your health care provider about how often to check your blood glucose. This may depend on:  The type of diabetes you have.  How well-controlled your diabetes is.  Medicines  you are taking.  If you have type 1 diabetes:  Check your blood glucose at least 2 times a day.  Also check your blood glucose: ? Before every insulin injection. ? Before and after exercise. ? Between meals. ? 2 hours after a meal. ? Occasionally between 2:00 a.m. and 3:00 a.m., as directed. ? Before potentially dangerous tasks, like driving or using heavy machinery. ? At bedtime.  You may need to check your blood glucose more often, up to 6-10 times a day: ? If you use an insulin pump. ? If you need multiple daily injections (MDI). ? If your diabetes is not well-controlled. ? If you are ill. ? If you have a history of severe hypoglycemia. ? If you have a history of not knowing when your blood glucose is getting low (hypoglycemia unawareness). If you have type 2 diabetes:  If you take insulin or other diabetes medicines, check your blood glucose at least 2 times a day.  If you are on intensive insulin therapy, check your blood glucose at least 4 times a day. Occasionally, you may also need to check between 2:00 a.m. and 3:00 a.m., as directed.  Also check your blood glucose: ? Before and after exercise. ? Before potentially dangerous tasks, like driving or using heavy machinery.  You may need to check your blood glucose more often if: ? Your medicine is being adjusted. ? Your diabetes is not well-controlled. ? You are ill. What is a blood glucose log?  A blood glucose log is a record of your blood glucose readings. It helps you and your  health care provider: ? Look for patterns in your blood glucose over time. ? Adjust your diabetes management plan as needed.  Every time you check your blood glucose, write down your result and notes about things that may be affecting your blood glucose, such as your diet and exercise for the day.  Most glucose meters store a record of glucose readings in the meter. Some meters allow you to download your records to a computer. How do I  check my blood glucose? Follow these steps to get accurate readings of your blood glucose: Supplies needed   Blood glucose meter.  Test strips for your meter. Each meter has its own strips. You must use the strips that come with your meter.  A needle to prick your finger (lancet). Do not use lancets more than once.  A device that holds the lancet (lancing device).  A journal or log book to write down your results. Procedure  Wash your hands with soap and water.  Prick the side of your finger (not the tip) with the lancet. Use a different finger each time.  Gently rub the finger until a small drop of blood appears.  Follow instructions that come with your meter for inserting the test strip, applying blood to the strip, and using your blood glucose meter.  Write down your result and any notes. Alternative testing sites  Some meters allow you to use areas of your body other than your finger (alternative sites) to test your blood.  If you think you may have hypoglycemia, or if you have hypoglycemia unawareness, do not use alternative sites. Use your finger instead.  Alternative sites may not be as accurate as the fingers, because blood flow is slower in these areas. This means that the result you get may be delayed, and it may be different from the result that you would get from your finger.  The most common alternative sites are: ? Forearm. ? Thigh. ? Palm of the hand. Additional tips  Always keep your supplies with you.  If you have questions or need help, all blood glucose meters have a 24-hour "hotline" number that you can call. You may also contact your health care provider.  After you use a few boxes of test strips, adjust (calibrate) your blood glucose meter by following instructions that came with your meter. This information is not intended to replace advice given to you by your health care provider. Make sure you discuss any questions you have with your health care  provider. Document Released: 01/05/2003 Document Revised: 07/23/2015 Document Reviewed: 06/14/2015 Elsevier Interactive Patient Education  2017 [employer].

## 2017-08-25 LAB — COMPREHENSIVE METABOLIC PANEL
A/G RATIO: 2.1 (ref 1.2–2.2)
ALT: 16 IU/L (ref 0–44)
AST: 19 IU/L (ref 0–40)
Albumin: 4.8 g/dL (ref 3.6–4.8)
Alkaline Phosphatase: 46 IU/L (ref 39–117)
BUN/Creatinine Ratio: 23 (ref 10–24)
BUN: 28 mg/dL — ABNORMAL HIGH (ref 8–27)
Bilirubin Total: 0.5 mg/dL (ref 0.0–1.2)
CALCIUM: 9.5 mg/dL (ref 8.6–10.2)
CO2: 20 mmol/L (ref 20–29)
CREATININE: 1.24 mg/dL (ref 0.76–1.27)
Chloride: 107 mmol/L — ABNORMAL HIGH (ref 96–106)
GFR, EST AFRICAN AMERICAN: 68 mL/min/{1.73_m2} (ref >59)
GFR, EST NON AFRICAN AMERICAN: 59 mL/min/{1.73_m2} — AB (ref >59)
GLOBULIN, TOTAL: 2.3 g/dL (ref 1.5–4.5)
Glucose: 110 mg/dL — ABNORMAL HIGH (ref 65–99)
POTASSIUM: 4.2 mmol/L (ref 3.5–5.2)
SODIUM: 144 mmol/L (ref 134–144)
Total Protein: 7.1 g/dL (ref 6.0–8.5)

## 2017-08-25 LAB — LIPID PANEL
Chol/HDL Ratio: 1.8 ratio (ref 0.0–5.0)
Cholesterol, Total: 103 mg/dL (ref 100–199)
HDL: 57 mg/dL (ref >39)
TRIGLYCERIDES: 254 mg/dL — AB (ref 0–149)
VLDL Cholesterol Cal: 51 mg/dL — ABNORMAL HIGH (ref 5–40)

## 2017-08-25 NOTE — Progress Notes (Signed)
Addendum Lab Results  Component Value Date   CHOL 103 08/24/2017   HDL 57 08/24/2017   LDLCALC Comment (A) 08/24/2017   TRIG 254 (H) 08/24/2017   CHOLHDL 1.8 08/24/2017   Patient LDL was zero a year ago  Now it is undetectable Will stop atorvastatin for 3 months and recheck He is to take niacin for his triglycerides

## 2017-08-31 DIAGNOSIS — N183 Chronic kidney disease, stage 3 (moderate): Secondary | ICD-10-CM | POA: Diagnosis not present

## 2017-08-31 LAB — BASIC METABOLIC PANEL: CREATININE: 1.4 — AB (ref 0.6–1.3)

## 2017-08-31 LAB — CBC AND DIFFERENTIAL: HEMOGLOBIN: 13.4 — AB (ref 13.5–17.5)

## 2017-09-07 DIAGNOSIS — N183 Chronic kidney disease, stage 3 (moderate): Secondary | ICD-10-CM | POA: Diagnosis not present

## 2017-09-07 DIAGNOSIS — N281 Cyst of kidney, acquired: Secondary | ICD-10-CM | POA: Diagnosis not present

## 2017-09-07 DIAGNOSIS — D472 Monoclonal gammopathy: Secondary | ICD-10-CM | POA: Diagnosis not present

## 2017-09-07 DIAGNOSIS — I10 Essential (primary) hypertension: Secondary | ICD-10-CM | POA: Diagnosis not present

## 2017-10-08 ENCOUNTER — Other Ambulatory Visit: Payer: Self-pay | Admitting: Emergency Medicine

## 2017-10-08 DIAGNOSIS — I10 Essential (primary) hypertension: Secondary | ICD-10-CM

## 2017-10-08 NOTE — Telephone Encounter (Signed)
Valsartan-HCTZ 320-25 mg  refill Last Refill:10/20/16 # 90 and 3 refills Last OV: 08/24/17 PCP: Burnett Harry Pharmacy:[facility] # [phone]

## 2017-10-22 ENCOUNTER — Ambulatory Visit: Payer: Self-pay | Admitting: *Deleted

## 2017-10-22 NOTE — Telephone Encounter (Signed)
Pt reports mid upper back pain x 2 months, worsening over weekend. States intermittent  "Like spasms." Does not recall any injury, no strenuous lifting as of late. Does not radiate. Also reports right arm "Tingles at times." No weakness. Reports both feet with swelling over weekend, not presently, and intermittent lightheadedness, "Feel unsteady at times." States tylenol helps back pain but "Comes right back." Denies any pain with urination. Requesting appt for Friday  due to work schedule. NT unable to secure as all Appts 'SAme Day.' Advised pt office would CB to schedule appt.  CB [phone]. Care advise given per protocol.  Reason for Disposition . [1] Numbness in an arm or hand (i.e., loss of sensation) AND [2] upper back pain . [1] MODERATE back pain (e.g., interferes with normal activities) AND [2] present > 3 days  Answer Assessment - Initial Assessment Questions 1. ONSET: "When did the pain begin?"      2 months ago 2. LOCATION: "Where does it hurt?" (upper, mid or lower back)     Middle of upper back 3. SEVERITY: "How bad is the pain?"  (e.g., Scale 1-10; mild, moderate, or severe)   - MILD (1-3): doesn't interfere with normal activities    - MODERATE (4-7): interferes with normal activities or awakens from sleep    - SEVERE (8-10): excruciating pain, unable to do any normal activities      7-8/10 like a spasms 4. PATTERN: "Is the pain constant?" (e.g., yes, no; constant, intermittent)      Intermittent 5. RADIATION: "Does the pain shoot into your legs or elsewhere?"     no 6. CAUSE:  "What do you think is causing the back pain?"      unsure 7. BACK OVERUSE:  "Any recent lifting of heavy objects, strenuous work or exercise?"     no 8. MEDICATIONS: "What have you taken so far for the pain?" (e.g., nothing, acetaminophen, NSAIDS)     Tylenol helps 9. NEUROLOGIC SYMPTOMS: "Do you have any weakness, numbness, or problems with bowel/bladder control?"     Tingling in right hand   10. OTHER SYMPTOMS: "Do you have any other symptoms?" (e.g., fever, abdominal pain, burning with urination, blood in urine)       Swelling both feet over weekend, mild swelling now, dizziness x 2 months, unsteady on feet once in a while.  Protocols used: BACK PAIN-A-AH

## 2017-10-23 NOTE — Telephone Encounter (Signed)
Patient has an apt for 10/12

## 2017-10-23 NOTE — Telephone Encounter (Signed)
Please call pt and schedule appt.  No available appt with pcp.

## 2017-10-27 ENCOUNTER — Encounter: Payer: Self-pay | Admitting: Family Medicine

## 2017-10-27 ENCOUNTER — Other Ambulatory Visit: Payer: Self-pay

## 2017-10-27 ENCOUNTER — Ambulatory Visit (INDEPENDENT_AMBULATORY_CARE_PROVIDER_SITE_OTHER): Payer: BLUE CROSS/BLUE SHIELD | Admitting: Family Medicine

## 2017-10-27 VITALS — BP 148/73 | HR 87 | Temp 98.5°F | Resp 16 | Ht 73.0 in | Wt 229.2 lb

## 2017-10-27 DIAGNOSIS — R42 Dizziness and giddiness: Secondary | ICD-10-CM

## 2017-10-27 DIAGNOSIS — F17200 Nicotine dependence, unspecified, uncomplicated: Secondary | ICD-10-CM | POA: Diagnosis not present

## 2017-10-27 DIAGNOSIS — I10 Essential (primary) hypertension: Secondary | ICD-10-CM | POA: Diagnosis not present

## 2017-10-27 DIAGNOSIS — Z23 Encounter for immunization: Secondary | ICD-10-CM | POA: Diagnosis not present

## 2017-10-27 DIAGNOSIS — R109 Unspecified abdominal pain: Secondary | ICD-10-CM | POA: Diagnosis not present

## 2017-10-27 DIAGNOSIS — E1165 Type 2 diabetes mellitus with hyperglycemia: Secondary | ICD-10-CM | POA: Diagnosis not present

## 2017-10-27 DIAGNOSIS — M62830 Muscle spasm of back: Secondary | ICD-10-CM | POA: Diagnosis not present

## 2017-10-27 MED ORDER — METHOCARBAMOL 500 MG PO TABS: ORAL_TABLET | ORAL | 1 refills | Status: DC

## 2017-10-27 MED ORDER — MECLIZINE HCL 12.5 MG PO TABS: 12.5000 mg | ORAL_TABLET | Freq: Three times a day (TID) | ORAL | 0 refills | Status: DC | PRN

## 2017-10-27 MED ORDER — AMLODIPINE BESYLATE 5 MG PO TABS: 5.0000 mg | ORAL_TABLET | Freq: Every day | ORAL | 3 refills | Status: DC

## 2017-10-27 NOTE — Progress Notes (Signed)
Patient ID: John Blake, male    DOB: [birth year]  Age: 72 y.o. MRN: [id number]  Chief Complaint  Patient presents with  . Back Pain    2-3  WEEKS lower right  . Dizziness    x 1 month and medication refill    Subjective:   72 year old man who is here for a couple of things.  For the last few weeks she has had intermittent dizziness.  It is just a very mild lightheadedness.  No room spinning.  No syncope.  No falls.  No tingling in arms or legs.  Just does not feel right in his head.  Patient also has had some spasm of the right side of his back for the last few weeks.  No specific injury.  When he gets up off the toilet or leans forward it does spasms up on time to time.  He lost about 30 pounds, then he has plateaued at at his current weight.  He does not get much exercise.  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  He smokes about 1/2 pack cigarettes a day we had a long talk about that.  He needs his blood pressure medicine refilled.  Current allergies, medications, problem list, past/family and social histories reviewed.  Objective:  BP (!) 148/73   Pulse 87   Temp 98.5 F (36.9 C) (Oral)   Resp 16   Ht 6\' 1"  (1.854 m)   Wt 229 lb 3.2 oz (104 kg)   SpO2 98%   BMI 30.24 kg/m   No acute distress.  Smells of tobacco.  Eyes PRL.  Fundi benign.  TMs normal.  Throat clear.  Neck supple without nodes or thyromegaly.  No carotid bruits.  Chest clear.  Heart rate without murmur.  Gait normal.  He is got a little asymmetry of the folds of the skin on his right side of his back being more prominent than the left, indicating he probably has a little minimal curvature in his spine.  No tenderness down the spine.  Good flexion and extension and range of motion but he does ache a little with each movement.  Pretty nonspecific.  Assessment & Plan:   Assessment: 1. Back muscle spasm   2. Need for prophylactic vaccination and inoculation against influenza   3. Dizziness   4.  Flank pain   5. Essential hypertension   6. Type 2 diabetes mellitus with hyperglycemia, without long-term current use of insulin (HCC)   7. Smoker       Plan: Instructions  Orders Placed This Encounter  Procedures  . Flu vaccine HIGH DOSE PF  . Ambulatory referral to Ophthalmology    Referral Priority:   Routine    Referral Type:   Consultation    Referral Reason:   Specialty Services Required    Requested Specialty:   Ophthalmology    Number of Visits Requested:   1    Meds ordered this encounter  Medications  . amLODipine (NORVASC) 5 MG tablet    Sig: Take 1 tablet (5 mg total) by mouth at bedtime.    Dispense:  90 tablet    Refill:  3  . meclizine (ANTIVERT) 12.5 MG tablet    Sig: Take 1 tablet (12.5 mg total) by mouth 3 (three) times daily as needed for dizziness.    Dispense:  30 tablet    Refill:  0  . methocarbamol (ROBAXIN) 500 MG tablet    Sig: Take 1 pill  3 times daily as needed for back spasms.    Dispense:  40 tablet    Refill:  1         Patient Instructions   Continue taking 2 Tylenol twice daily as needed for pain.  Actually you can take 2 pills 3 times daily if necessary.  If they do not irritate your stomach, you can take Aleve 2 pills twice daily only when having acute back pain problems.  These should not be continued long-term in a patient with diabetes and hypertension.  Use the methocarbamol (Robaxin) 500 mg 1 pill 3 times daily as needed for back spasms.  Use the meclizine 12.5 mg 1 pill 3 times daily as needed for dizziness.  May cause a little drowsiness.  If your symptoms get worse return.  Referral is being made to an ophthalmologist for evaluation of your eyes as a diabetic.  Someone will contact you about that over the next week.  Try doing some back stretches and exercises, and continue to work on losing another 10 to 20 pounds if possible.  Establish with a regular primary care provider.  Work hard on getting rid of the  cigarettes per our discussion.    Back Exercises The following exercises strengthen the muscles that help to support the back. They also help to keep the lower back flexible. Doing these exercises can help to prevent back pain or lessen existing pain. If you have back pain or discomfort, try doing these exercises 2-3 times each day or as told by your health care provider. When the pain goes away, do them once each day, but increase the number of times that you repeat the steps for each exercise (do more repetitions). If you do not have back pain or discomfort, do these exercises once each day or as told by your health care provider. Exercises Single Knee to Chest  Repeat these steps 3-5 times for each leg: 1. Lie on your back on a firm bed or the floor with your legs extended. 2. Bring one knee to your chest. Your other leg should stay extended and in contact with the floor. 3. Hold your knee in place by grabbing your knee or thigh. 4. Pull on your knee until you feel a gentle stretch in your lower back. 5. Hold the stretch for 10-30 seconds. 6. Slowly release and straighten your leg.  Pelvic Tilt  Repeat these steps 5-10 times: 1. Lie on your back on a firm bed or the floor with your legs extended. 2. Bend your knees so they are pointing toward the ceiling and your feet are flat on the floor. 3. Tighten your lower abdominal muscles to press your lower back against the floor. This motion will tilt your pelvis so your tailbone points up toward the ceiling instead of pointing to your feet or the floor. 4. With gentle tension and even breathing, hold this position for 5-10 seconds.  Cat-Cow  Repeat these steps until your lower back becomes more flexible: 1. Get into a hands-and-knees position on a firm surface. Keep your hands under your shoulders, and keep your knees under your hips. You may place padding under your knees for comfort. 2. Let your head hang down, and point your tailbone  toward the floor so your lower back becomes rounded like the back of a cat. 3. Hold this position for 5 seconds. 4. Slowly lift your head and point your tailbone up toward the ceiling so your back forms a sagging arch  like the back of a cow. 5. Hold this position for 5 seconds.  Press-Ups  Repeat these steps 5-10 times: 1. Lie on your abdomen (face-down) on the floor. 2. Place your palms near your head, about shoulder-width apart. 3. While you keep your back as relaxed as possible and keep your hips on the floor, slowly straighten your arms to raise the top half of your body and lift your shoulders. Do not use your back muscles to raise your upper torso. You may adjust the placement of your hands to make yourself more comfortable. 4. Hold this position for 5 seconds while you keep your back relaxed. 5. Slowly return to lying flat on the floor.  Bridges  Repeat these steps 10 times: 1. Lie on your back on a firm surface. 2. Bend your knees so they are pointing toward the ceiling and your feet are flat on the floor. 3. Tighten your buttocks muscles and lift your buttocks off of the floor until your waist is at almost the same height as your knees. You should feel the muscles working in your buttocks and the back of your thighs. If you do not feel these muscles, slide your feet 1-2 inches farther away from your buttocks. 4. Hold this position for 3-5 seconds. 5. Slowly lower your hips to the starting position, and allow your buttocks muscles to relax completely.  If this exercise is too easy, try doing it with your arms crossed over your chest. Abdominal Crunches  Repeat these steps 5-10 times: 1. Lie on your back on a firm bed or the floor with your legs extended. 2. Bend your knees so they are pointing toward the ceiling and your feet are flat on the floor. 3. Cross your arms over your chest. 4. Tip your chin slightly toward your chest without bending your neck. 5. Tighten your  abdominal muscles and slowly raise your trunk (torso) high enough to lift your shoulder blades a tiny bit off of the floor. Avoid raising your torso higher than that, because it can put too much stress on your low back and it does not help to strengthen your abdominal muscles. 6. Slowly return to your starting position.  Back Lifts Repeat these steps 5-10 times: 1. Lie on your abdomen (face-down) with your arms at your sides, and rest your forehead on the floor. 2. Tighten the muscles in your legs and your buttocks. 3. Slowly lift your chest off of the floor while you keep your hips pressed to the floor. Keep the back of your head in line with the curve in your back. Your eyes should be looking at the floor. 4. Hold this position for 3-5 seconds. 5. Slowly return to your starting position.  Contact a health care provider if:  Your back pain or discomfort gets much worse when you do an exercise.  Your back pain or discomfort does not lessen within 2 hours after you exercise. If you have any of these problems, stop doing these exercises right away. Do not do them again unless your health care provider says that you can. Get help right away if:  You develop sudden, severe back pain. If this happens, stop doing the exercises right away. Do not do them again unless your health care provider says that you can. This information is not intended to replace advice given to you by your health care provider. Make sure you discuss any questions you have with your health care provider. Document Released: 02/10/2004 Document Revised: 05/12/2015  Document Reviewed: 02/26/2014 Elsevier Interactive Patient Education  [employer].  If you have lab work done today you will be contacted with your lab results within the next 2 weeks.  If you have not heard from [place] then please contact us. The fastest way to get your results is to register for My Chart.   IF you received an x-ray today, you will receive an  invoice from [facility]. Please contact [facility] at [phone] with questions or concerns regarding your invoice.   IF you received labwork today, you will receive an invoice from [place]. Please contact [facility] at [phone] with questions or concerns regarding your invoice.   Our billing staff will not be able to assist you with questions regarding bills from these companies.  You will be contacted with the lab results as soon as they are available. The fastest way to get your results is to activate your My Chart account. Instructions are located on the last page of this paperwork. If you have not heard from [place] regarding the results in 2 weeks, please contact this office.        Return if symptoms worsen or fail to improve.   Ruben Reason, MD 10/27/2017

## 2017-10-27 NOTE — Patient Instructions (Addendum)
Continue taking 2 Tylenol twice daily as needed for pain.  Actually you can take 2 pills 3 times daily if necessary.  If they do not irritate your stomach, you can take Aleve 2 pills twice daily only when having acute back pain problems.  These should not be continued long-term in a patient with diabetes and hypertension.  Use the methocarbamol (Robaxin) 500 mg 1 pill 3 times daily as needed for back spasms.  Use the meclizine 12.5 mg 1 pill 3 times daily as needed for dizziness.  May cause a little drowsiness.  If your symptoms get worse return.  Referral is being made to an ophthalmologist for evaluation of your eyes as a diabetic.  Someone will contact you about that over the next week.  Try doing some back stretches and exercises, and continue to work on losing another 10 to 20 pounds if possible.  Establish with a regular primary care provider.  Work hard on getting rid of the cigarettes per our discussion.    Back Exercises The following exercises strengthen the muscles that help to support the back. They also help to keep the lower back flexible. Doing these exercises can help to prevent back pain or lessen existing pain. If you have back pain or discomfort, try doing these exercises 2-3 times each day or as told by your health care provider. When the pain goes away, do them once each day, but increase the number of times that you repeat the steps for each exercise (do more repetitions). If you do not have back pain or discomfort, do these exercises once each day or as told by your health care provider. Exercises Single Knee to Chest  Repeat these steps 3-5 times for each leg: 1. Lie on your back on a firm bed or the floor with your legs extended. 2. Bring one knee to your chest. Your other leg should stay extended and in contact with the floor. 3. Hold your knee in place by grabbing your knee or thigh. 4. Pull on your knee until you feel a gentle stretch in your lower  back. 5. Hold the stretch for 10-30 seconds. 6. Slowly release and straighten your leg.  Pelvic Tilt  Repeat these steps 5-10 times: 1. Lie on your back on a firm bed or the floor with your legs extended. 2. Bend your knees so they are pointing toward the ceiling and your feet are flat on the floor. 3. Tighten your lower abdominal muscles to press your lower back against the floor. This motion will tilt your pelvis so your tailbone points up toward the ceiling instead of pointing to your feet or the floor. 4. With gentle tension and even breathing, hold this position for 5-10 seconds.  Cat-Cow  Repeat these steps until your lower back becomes more flexible: 1. Get into a hands-and-knees position on a firm surface. Keep your hands under your shoulders, and keep your knees under your hips. You may place padding under your knees for comfort. 2. Let your head hang down, and point your tailbone toward the floor so your lower back becomes rounded like the back of a cat. 3. Hold this position for 5 seconds. 4. Slowly lift your head and point your tailbone up toward the ceiling so your back forms a sagging arch like the back of a cow. 5. Hold this position for 5 seconds.  Press-Ups  Repeat these steps 5-10 times: 1. Lie on your abdomen (face-down) on the floor. 2. Place your palms  near your head, about shoulder-width apart. 3. While you keep your back as relaxed as possible and keep your hips on the floor, slowly straighten your arms to raise the top half of your body and lift your shoulders. Do not use your back muscles to raise your upper torso. You may adjust the placement of your hands to make yourself more comfortable. 4. Hold this position for 5 seconds while you keep your back relaxed. 5. Slowly return to lying flat on the floor.  Bridges  Repeat these steps 10 times: 1. Lie on your back on a firm surface. 2. Bend your knees so they are pointing toward the ceiling and your feet are  flat on the floor. 3. Tighten your buttocks muscles and lift your buttocks off of the floor until your waist is at almost the same height as your knees. You should feel the muscles working in your buttocks and the back of your thighs. If you do not feel these muscles, slide your feet 1-2 inches farther away from your buttocks. 4. Hold this position for 3-5 seconds. 5. Slowly lower your hips to the starting position, and allow your buttocks muscles to relax completely.  If this exercise is too easy, try doing it with your arms crossed over your chest. Abdominal Crunches  Repeat these steps 5-10 times: 1. Lie on your back on a firm bed or the floor with your legs extended. 2. Bend your knees so they are pointing toward the ceiling and your feet are flat on the floor. 3. Cross your arms over your chest. 4. Tip your chin slightly toward your chest without bending your neck. 5. Tighten your abdominal muscles and slowly raise your trunk (torso) high enough to lift your shoulder blades a tiny bit off of the floor. Avoid raising your torso higher than that, because it can put too much stress on your low back and it does not help to strengthen your abdominal muscles. 6. Slowly return to your starting position.  Back Lifts Repeat these steps 5-10 times: 1. Lie on your abdomen (face-down) with your arms at your sides, and rest your forehead on the floor. 2. Tighten the muscles in your legs and your buttocks. 3. Slowly lift your chest off of the floor while you keep your hips pressed to the floor. Keep the back of your head in line with the curve in your back. Your eyes should be looking at the floor. 4. Hold this position for 3-5 seconds. 5. Slowly return to your starting position.  Contact a health care provider if:  Your back pain or discomfort gets much worse when you do an exercise.  Your back pain or discomfort does not lessen within 2 hours after you exercise. If you have any of these  problems, stop doing these exercises right away. Do not do them again unless your health care provider says that you can. Get help right away if:  You develop sudden, severe back pain. If this happens, stop doing the exercises right away. Do not do them again unless your health care provider says that you can. This information is not intended to replace advice given to you by your health care provider. Make sure you discuss any questions you have with your health care provider. Document Released: 02/10/2004 Document Revised: 05/12/2015 Document Reviewed: 02/26/2014 Elsevier Interactive Patient Education  [employer].  If you have lab work done today you will be contacted with your lab results within the next 2 weeks.  If you have not heard from [place] then please contact us. The fastest way to get your results is to register for My Chart.   IF you received an x-ray today, you will receive an invoice from [facility]. Please contact [facility] at [phone] with questions or concerns regarding your invoice.   IF you received labwork today, you will receive an invoice from [place]. Please contact [facility] at [phone] with questions or concerns regarding your invoice.   Our billing staff will not be able to assist you with questions regarding bills from these companies.  You will be contacted with the lab results as soon as they are available. The fastest way to get your results is to activate your My Chart account. Instructions are located on the last page of this paperwork. If you have not heard from [place] regarding the results in 2 weeks, please contact this office.

## 2017-11-24 ENCOUNTER — Ambulatory Visit (INDEPENDENT_AMBULATORY_CARE_PROVIDER_SITE_OTHER): Payer: BLUE CROSS/BLUE SHIELD | Admitting: Family Medicine

## 2017-11-24 ENCOUNTER — Other Ambulatory Visit: Payer: Self-pay

## 2017-11-24 ENCOUNTER — Encounter: Payer: Self-pay | Admitting: Family Medicine

## 2017-11-24 VITALS — BP 118/68 | HR 86 | Temp 98.6°F | Resp 16 | Ht 73.0 in | Wt 240.0 lb

## 2017-11-24 DIAGNOSIS — M79674 Pain in right toe(s): Secondary | ICD-10-CM

## 2017-11-24 DIAGNOSIS — M7989 Other specified soft tissue disorders: Secondary | ICD-10-CM

## 2017-11-24 DIAGNOSIS — R7303 Prediabetes: Secondary | ICD-10-CM | POA: Diagnosis not present

## 2017-11-24 LAB — POCT GLYCOSYLATED HEMOGLOBIN (HGB A1C): HEMOGLOBIN A1C: 6 % — AB (ref 4.0–5.6)

## 2017-11-24 MED ORDER — PREDNISONE 20 MG PO TABS: 40.0000 mg | ORAL_TABLET | Freq: Every day | ORAL | 0 refills | Status: AC

## 2017-11-24 MED ORDER — DOXYCYCLINE HYCLATE 100 MG PO CAPS: 100.0000 mg | ORAL_CAPSULE | Freq: Two times a day (BID) | ORAL | 0 refills | Status: DC

## 2017-11-24 NOTE — Progress Notes (Signed)
Patient ID: John Blake, male    DOB: 03/12/1945, 72 y.o.   MRN: [id number]  PCP: Forrest Moron, MD  Chief Complaint  Patient presents with  . Toe    great right toe swollen x 1 wk    Subjective:  HPI John Blake is a 72 y.o. male presents for evaluation of right foot swelling and redness. No known injury or insect bite. No history of gout.  He has a history of prediabetes only.  Swelling and redness has gradually worsened over the course of 1 week.  No prior similar symptoms.  He denies fever, chills, nausea, vomiting, chest pain, shortness of breath or fatigue.  Social History   Socioeconomic History  . Marital status: Married    Spouse name: Bethena Roys  . Number of children: 2  . Years of education: 12th grade  . Highest education level: Not on file  Occupational History  . Occupation: retired from trucking    Comment: due to rotator cuff injury  . Occupation: part time security guard  Social Needs  . Financial resource strain: Not on file  . Food insecurity:    Worry: Not on file    Inability: Not on file  . Transportation needs:    Medical: Not on file    Non-medical: Not on file  Tobacco Use  . Smoking status: Current Every Day Smoker    Packs/day: 0.50    Years: 45.00    Pack years: 22.50    Types: Cigarettes    Start date: 09/10/1969  . Smokeless tobacco: Never Used  . Tobacco comment: Peak rate of 1.5ppd - Has stopped for 2 years previously  Substance and Sexual Activity  . Alcohol use: Yes    Alcohol/week: 14.0 standard drinks    Types: 14 Shots of liquor per week    Comment: 2 DRINKS DAILY  . Drug use: No    Comment: remote cocaine  . Sexual activity: Not on file  Lifestyle  . Physical activity:    Days per week: Not on file    Minutes per session: Not on file  . Stress: Not on file  Relationships  . Social connections:    Talks on phone: Not on file    Gets together: Not on file    Attends religious service: Not on file    Active member of  club or organization: Not on file    Attends meetings of clubs or organizations: Not on file    Relationship status: Not on file  . Intimate partner violence:    Fear of current or ex partner: Not on file    Emotionally abused: Not on file    Physically abused: Not on file    Forced sexual activity: Not on file  Other Topics Concern  . Not on file  Social History Narrative   Lives with wife.   Children are adult, and live independently in [place] and [place].      [name] (07/14/16):   Originally from [facility]. He served in [employer] and was in [place] and [place]. 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 Has a dog currently. No bird or mold exposure. No known asbestos exposure.     Family History  Problem Relation Age of Onset  . Diabetes Mother   . Esophageal cancer Neg Hx   . Colon cancer Neg  Hx    Review of Systems Pertinent negatives listed in HPI  Patient Active Problem List   Diagnosis Date Noted  . Flank pain 01/05/2017  . History of colonic polyps 12/27/2016  . Chronic left shoulder pain 10/20/2016  . Bilateral renal cysts 08/02/2016  . 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 COPD (chronic obstructive pulmonary disease) ([place]) 07/14/2016  . Smoker 07/14/2016  . Chronic seasonal allergic rhinitis 07/14/2016  . CKD (chronic kidney disease) stage 3, GFR 30-59 ml/min ([facility]) 05/30/2016  . Essential hypertension 02/05/2015  . Hyperlipidemia 02/05/2015  . BMI 34.0-34.9,adult 02/05/2015  . Type 2 diabetes mellitus with hyperglycemia, without long-term current use of insulin ([place]) 06/17/2014  . Shoulder impingement syndrome 06/17/2014    Allergies  Allergen Reactions  . Nsaids     Kidney disease    Prior to Admission medications   Medication Sig Start Date End Date Taking? Authorizing Provider  acetaminophen  (TYLENOL) 325 MG tablet Take 325 mg by mouth 2 (two) times daily.   Yes [provider]  albuterol (PROAIR HFA) 108 (90 Base) MCG/ACT inhaler Inhale 2 puffs into the lungs every 6 (six) hours as needed for wheezing or shortness of breath. 08/24/17  Yes Stallings, Zoe A, MD  amLODipine (NORVASC) 5 MG tablet Take 1 tablet (5 mg total) by mouth at bedtime. 10/27/17  Yes Posey Boyer, MD  atorvastatin (LIPITOR) 20 MG tablet Take 1 tablet (20 mg total) by mouth every morning. Office visit needed 08/24/17  Yes Stallings, Zoe A, MD  azelastine (ASTELIN) 0.1 % nasal spray Place 2 sprays into both nostrils 2 (two) times daily. Use in each nostril as directed 08/24/17  Yes Stallings, Zoe A, MD  fluticasone (FLONASE) 50 MCG/ACT nasal spray Place 2 sprays into both nostrils daily. 02/12/16  Yes Jeffery, Domingo Mend, PA  fluticasone-salmeterol (ADVAIR HFA) 115-21 MCG/ACT inhaler Inhale 2 puffs into the lungs 2 (two) times daily. 08/24/17  Yes Stallings, Zoe A, MD  glucose blood test strip Use to test blood sugar once daily. Dx: E11.9 06/28/15  Yes [name], MD  Lancets MISC Use for home glucose monitoring 01/21/15  Yes Harrison Mons, PA  meclizine (ANTIVERT) 12.5 MG tablet Take 1 tablet (12.5 mg total) by mouth 3 (three) times daily as needed for dizziness. 10/27/17  Yes Posey Boyer, MD  methocarbamol (ROBAXIN) 500 MG tablet Take 1 pill 3 times daily as needed for back spasms. 10/27/17  Yes Posey Boyer, MD  omeprazole (PRILOSEC) 40 MG capsule Take 1 capsule (40 mg total) by mouth 2 (two) times daily. 08/24/17  Yes Forrest Moron, MD  ONE TOUCH LANCETS MISC Use for home glucose monitoring 01/29/15  Yes Jeffery, Chelle, PA  valsartan-hydrochlorothiazide (DIOVAN-HCT) 320-25 MG tablet TAKE 1 TABLET BY MOUTH EVERY DAY IN THE MORNING 10/09/17  Yes Stallings, Zoe A, MD  varenicline (CHANTIX CONTINUING MONTH PAK) 1 MG tablet Take 1 tablet (1 mg total) by mouth 2 (two) times daily. 01/28/16  Yes Forrest Moron, MD     Past Medical, Surgical Family and Social History reviewed and updated.    Objective:   Today's Vitals   11/24/17 0949  BP: 118/68  Pulse: 86  Resp: 16  Temp: 98.6 F (37 C)  TempSrc: Oral  SpO2: 97%  Weight: 240 lb (108.9 kg)  Height: 6\' 1"  (1.854 m)    Wt Readings from Last 3 Encounters:  11/24/17 240 lb (108.9 kg)  10/27/17 229 lb 3.2 oz (104  kg)  08/24/17 228 lb 12.8 oz (103.8 kg)     Physical Exam General appearance: alert, well developed, well nourished, cooperative and in no distress Head: Normocephalic, without obvious abnormality, atraumatic Respiratory: Respirations even and unlabored, normal respiratory rate Heart: rate and rhythm normal. No gallop or murmurs noted on exam  Extremities: Right lower extremity erythema and +3 edema present. Darken bruising noted over the hallux valgus. Full ROM Skin: Skin color, texture, turgor normal. No rashes seen  Psych: Appropriate mood and affect. Neurologic: Mental status: Alert, oriented to person, place, and time, thought content appropriate.        Diabetic Foot Exam - Simple   Simple Foot Form Diabetic Foot exam was performed with the following findings:  Yes 11/24/2017  9:56 AM  Visual Inspection No deformities, no ulcerations, no other skin breakdown bilaterally:  Yes Sensation Testing Intact to touch and monofilament testing bilaterally:  Yes Pulse Check Comments Couldn't feel pedal pulses. Right foot swollen      Lab Results  Component Value Date   POCGLU 157 (A) 06/18/2015   POCGLU 131 (A) 08/03/2014   POCGLU 105 (A) 06/17/2014    Lab Results  Component Value Date   HGBA1C 6.2 (H) 04/27/2017     Assessment & Plan:  1. Prediabetes, repeat A1C. Last A1C 6.2. - HM Diabetes Foot Exam, normal sensation.  - POCT glycosylated hemoglobin (Hb A1C) stable at 6.0.    2. Great toe pain, right Suspect gout, however, appearance is suspicious for cellulitis . (see photo) -Will treat with antibiotic  therapy along with prednisone.  - Uric Acid  3. Toe swelling - Uric Acid, pending.  4. Pain of right great toe - Uric Acid, pending   Return for follow-up in 1 week for re-evaluation of foot.   Meds ordered this encounter  Medications  . doxycycline (VIBRAMYCIN) 100 MG capsule    Sig: Take 1 capsule (100 mg total) by mouth 2 (two) times daily.    Dispense:  20 capsule    Refill:  0  . predniSONE (DELTASONE) 20 MG tablet    Sig: Take 2 tablets (40 mg total) by mouth daily with breakfast for 10 days.    Dispense:  20 tablet    Refill:  0    -The patient was given clear instructions to go to ER or return to medical center if symptoms do not improve, worsen or new problems develop. The patient verbalized understanding.     Carroll Sage. Kenton Kingfisher, FNP-C Nurse Practitioner (PRN Staff)  [facility] [address] [place], [place]

## 2017-11-24 NOTE — Patient Instructions (Addendum)
     If you have lab work done today you will be contacted with your lab results within the next 2 weeks.  If you have not heard from [place] then please contact us. The fastest way to get your results is to register for My Chart.   IF you received an x-ray today, you will receive an invoice from [facility]. Please contact [facility] at [phone] with questions or concerns regarding your invoice.   IF you received labwork today, you will receive an invoice from [place]. Please contact [facility] at [phone] with questions or concerns regarding your invoice.   Our billing staff will not be able to assist you with questions regarding bills from these companies.  You will be contacted with the lab results as soon as they are available. The fastest way to get your results is to activate your My Chart account. Instructions are located on the last page of this paperwork. If you have not heard from [place] regarding the results in 2 weeks, please contact this office.       Cellulitis, Adult Cellulitis is a skin infection. The infected area is usually red and sore. This condition occurs most often in the arms and lower legs. It is very important to get treated for this condition. Follow these instructions at home:  Take over-the-counter and prescription medicines only as told by your doctor.  If you were prescribed an antibiotic medicine, take it as told by your doctor. Do not stop taking the antibiotic even if you start to feel better.  Drink enough fluid to keep your pee (urine) clear or pale yellow.  Do not touch or rub the infected area.  Raise (elevate) the infected area above the level of your heart while you are sitting or lying down.  Place warm or cold wet cloths (warm or cold compresses) on the infected area. Do this as told by your doctor.  Keep all follow-up visits as told by your doctor. This is important. These visits let your doctor make sure your infection is  not getting worse. Contact a doctor if:  You have a fever.  Your symptoms do not get better after 1-2 days of treatment.  Your bone or joint under the infected area starts to hurt after the skin has healed.  Your infection comes back. This can happen in the same area or another area.  You have a swollen bump in the infected area.  You have new symptoms.  You feel ill and also have muscle aches and pains. Get help right away if:  Your symptoms get worse.  You feel very sleepy.  You throw up (vomit) or have watery poop (diarrhea) for a long time.  There are red streaks coming from the infected area.  Your red area gets larger.  Your red area turns darker. This information is not intended to replace advice given to you by your health care provider. Make sure you discuss any questions you have with your health care provider. Document Released: 06/21/2007 Document Revised: 06/10/2015 Document Reviewed: 11/11/2014 Elsevier Interactive Patient Education  2018 [employer].

## 2017-11-25 LAB — URIC ACID: Uric Acid: 11.7 mg/dL — ABNORMAL HIGH (ref 3.7–8.6)

## 2017-12-01 ENCOUNTER — Encounter: Payer: Self-pay | Admitting: Emergency Medicine

## 2017-12-01 ENCOUNTER — Other Ambulatory Visit: Payer: Self-pay

## 2017-12-01 ENCOUNTER — Ambulatory Visit (INDEPENDENT_AMBULATORY_CARE_PROVIDER_SITE_OTHER): Payer: BLUE CROSS/BLUE SHIELD | Admitting: Emergency Medicine

## 2017-12-01 VITALS — BP 132/73 | HR 78 | Temp 98.0°F | Resp 16 | Ht 73.0 in | Wt 241.0 lb

## 2017-12-01 DIAGNOSIS — M109 Gout, unspecified: Secondary | ICD-10-CM

## 2017-12-01 DIAGNOSIS — R7303 Prediabetes: Secondary | ICD-10-CM | POA: Diagnosis not present

## 2017-12-01 DIAGNOSIS — M79674 Pain in right toe(s): Secondary | ICD-10-CM | POA: Diagnosis not present

## 2017-12-01 MED ORDER — OMEPRAZOLE 40 MG PO CPDR: 40.0000 mg | DELAYED_RELEASE_CAPSULE | Freq: Two times a day (BID) | ORAL | 1 refills | Status: DC
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
## 2017-12-01 NOTE — Patient Instructions (Addendum)
Continue and finish meds prescribed last week. Gout Gout is painful swelling that can happen in some of your joints. Gout is a type of arthritis. This condition is caused by having too much uric acid in your body. Uric acid is a chemical that is made when your body breaks down substances called purines. If your body has too much uric acid, sharp crystals can form and build up in your joints. This causes pain and swelling. Gout attacks can happen quickly and be very painful (acute gout). Over time, the attacks can affect more joints and happen more often (chronic gout). Follow these instructions at home: During a Gout Attack  If directed, put ice on the painful area: ? Put ice in a plastic bag. ? Place a towel between your skin and the bag. ? Leave the ice on for 20 minutes, 2-3 times a day.  Rest the joint as much as possible. If the joint is in your leg, you may be given crutches to use.  Raise (elevate) the painful joint above the level of your heart as often as you can.  Drink enough fluids to keep your pee (urine) clear or pale yellow.  Take over-the-counter and prescription medicines only as told by your doctor.  Do not drive or use heavy machinery while taking prescription pain medicine.  Follow instructions from your doctor about what you can or cannot eat and drink.  Return to your normal activities as told by your doctor. Ask your doctor what activities are safe for you. Avoiding Future Gout Attacks  Follow a low-purine diet as told by a specialist (dietitian) or your doctor. Avoid foods and drinks that have a lot of purines, such as: ? Liver. ? Kidney. ? Anchovies. ? Asparagus. ? Herring. ? Mushrooms ? Mussels. ? Beer.  Limit alcohol intake to no more than 1 drink a day for nonpregnant women and 2 drinks a day for men. One drink equals 12 oz of beer, 5 oz of wine, or 1 oz of hard liquor.  Stay at a healthy weight or lose weight if you are overweight. If you want to  lose weight, talk with your doctor. It is important that you do not lose weight too fast.  Start or continue an exercise plan as told by your doctor.  Drink enough fluids to keep your pee clear or pale yellow.  Take over-the-counter and prescription medicines only as told by your doctor.  Keep all follow-up visits as told by your doctor. This is important. Contact a doctor if:  You have another gout attack.  You still have symptoms of a gout attack after10 days of treatment.  You have problems (side effects) because of your medicines.  You have chills or a fever.  You have burning pain when you pee (urinate).  You have pain in your lower back or belly. Get help right away if:  You have very bad pain.  Your pain cannot be controlled.  You cannot pee. This information is not intended to replace advice given to you by your health care provider. Make sure you discuss any questions you have with your health care provider. Document Released: 10/12/2007 Document Revised: 06/10/2015 Document Reviewed: 10/15/2014 Elsevier Interactive Patient Education  [employer].     If you have lab work done today you will be contacted with your lab results within the next 2 weeks.  If you have not heard from [place] then please contact us. The fastest way to get your results  is to register for My Chart.   IF you received an x-ray today, you will receive an invoice from [facility]. Please contact [facility] at [phone] with questions or concerns regarding your invoice.   IF you received labwork today, you will receive an invoice from [facility]. Please contact [facility] at [phone] with questions or concerns regarding your invoice.   Our billing staff will not be able to assist you with questions regarding bills from these companies.  You will be contacted with the lab results as soon as they are available. The fastest way to get your results is to activate your My  Chart account. Instructions are located on the last page of this paperwork. If you have not heard from [place] regarding the results in 2 weeks, please contact this office.
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
## 2017-12-21 ENCOUNTER — Ambulatory Visit (INDEPENDENT_AMBULATORY_CARE_PROVIDER_SITE_OTHER): Payer: BLUE CROSS/BLUE SHIELD | Admitting: Family Medicine

## 2017-12-21 ENCOUNTER — Other Ambulatory Visit: Payer: Self-pay

## 2017-12-21 ENCOUNTER — Encounter: Payer: Self-pay | Admitting: Family Medicine

## 2017-12-21 ENCOUNTER — Encounter

## 2017-12-21 VITALS — BP 138/80 | HR 83 | Temp 97.6°F | Resp 17 | Ht 73.0 in | Wt 236.6 lb

## 2017-12-21 DIAGNOSIS — M79674 Pain in right toe(s): Secondary | ICD-10-CM | POA: Diagnosis not present

## 2017-12-21 DIAGNOSIS — N183 Chronic kidney disease, stage 3 unspecified: Secondary | ICD-10-CM

## 2017-12-21 DIAGNOSIS — M109 Gout, unspecified: Secondary | ICD-10-CM | POA: Insufficient documentation

## 2017-12-21 MED ORDER — COLCHICINE 0.6 MG PO TABS: ORAL_TABLET | ORAL | 3 refills | Status: DC

## 2017-12-21 MED ORDER — PREDNISONE 20 MG PO TABS: 40.0000 mg | ORAL_TABLET | Freq: Every day | ORAL | 0 refills | Status: AC

## 2017-12-21 NOTE — Assessment & Plan Note (Signed)
Last creatinine 1.24 Will use colchicine and monitor renal function closely Lately ckd has improved

## 2017-12-21 NOTE — Patient Instructions (Addendum)
Stop your atrovastatin while you are on the gout medication!!!   Gout Gout is painful swelling that can occur in some of your joints. Gout is a type of arthritis. This condition is caused by having too much uric acid in your body. Uric acid is a chemical that forms when your body breaks down substances called purines. Purines are important for building body proteins. When your body has too much uric acid, sharp crystals can form and build up inside your joints. This causes pain and swelling. Gout attacks can happen quickly and be very painful (acute gout). Over time, the attacks can affect more joints and become more frequent (chronic gout). Gout can also cause uric acid to build up under your skin and inside your kidneys. What are the causes? This condition is caused by too much uric acid in your blood. This can occur because:  Your kidneys do not remove enough uric acid from your blood. This is the most common cause.  Your body makes too much uric acid. This can occur with some cancers and cancer treatments. It can also occur if your body is breaking down too many red blood cells (hemolytic anemia).  You eat too many foods that are high in purines. These foods include organ meats and some seafood. Alcohol, especially beer, is also high in purines.  A gout attack may be triggered by trauma or stress. What increases the risk? This condition is more likely to develop in people who:  Have a family history of gout.  Are male and middle-aged.  Are male and have gone through menopause.  Are obese.  Frequently drink alcohol, especially beer.  Are dehydrated.  Lose weight too quickly.  Have an organ transplant.  Have lead poisoning.  Take certain medicines, including aspirin, cyclosporine, diuretics, levodopa, and niacin.  Have kidney disease or psoriasis.  What are the signs or symptoms? An attack of acute gout happens quickly. It usually occurs in just one joint. The most  common place is the big toe. Attacks often start at night. Other joints that may be affected include joints of the feet, ankle, knee, fingers, wrist, or elbow. Symptoms may include:  Severe pain.  Warmth.  Swelling.  Stiffness.  Tenderness. The affected joint may be very painful to touch.  Shiny, red, or purple skin.  Chills and fever.  Chronic gout may cause symptoms more frequently. More joints may be involved. You may also have white or yellow lumps (tophi) on your hands or feet or in other areas near your joints. How is this diagnosed? This condition is diagnosed based on your symptoms, medical history, and physical exam. You may have tests, such as:  Blood tests to measure uric acid levels.  Removal of joint fluid with a needle (aspiration) to look for uric acid crystals.  X-rays to look for joint damage.  How is this treated? Treatment for this condition has two phases: treating an acute attack and preventing future attacks. Acute gout treatment may include medicines to reduce pain and swelling, including:  NSAIDs.  Steroids. These are strong anti-inflammatory medicines that can be taken by mouth (orally) or injected into a joint.  Colchicine. This medicine relieves pain and swelling when it is taken soon after an attack. It can be given orally or through an IV tube.  Preventive treatment may include:  Daily use of smaller doses of NSAIDs or colchicine.  Use of a medicine that reduces uric acid levels in your blood.  Changes to your  diet. You may need to see a specialist about healthy eating (dietitian).  Follow these instructions at home: During a Gout Attack  If directed, apply ice to the affected area: ? Put ice in a plastic bag. ? Place a towel between your skin and the bag. ? Leave the ice on for 20 minutes, 2-3 times a day.  Rest the joint as much as possible. If the affected joint is in your leg, you may be given crutches to use.  Raise (elevate) the  affected joint above the level of your heart as often as possible.  Drink enough fluids to keep your urine clear or pale yellow.  Take over-the-counter and prescription medicines only as told by your health care provider.  Do not drive or operate heavy machinery while taking prescription pain medicine.  Follow instructions from your health care provider about eating or drinking restrictions.  Return to your normal activities as told by your health care provider. Ask your health care provider what activities are safe for you. Avoiding Future Gout Attacks  Follow a low-purine diet as told by your dietitian or health care provider. Avoid foods and drinks that are high in purines, including liver, kidney, anchovies, asparagus, herring, mushrooms, mussels, and beer.  Limit alcohol intake to no more than 1 drink a day for nonpregnant women and 2 drinks a day for men. One drink equals 12 oz of beer, 5 oz of wine, or 1 oz of hard liquor.  Maintain a healthy weight or lose weight if you are overweight. If you want to lose weight, talk with your health care provider. It is important that you do not lose weight too quickly.  Start or maintain an exercise program as told by your health care provider.  Drink enough fluids to keep your urine clear or pale yellow.  Take over-the-counter and prescription medicines only as told by your health care provider.  Keep all follow-up visits as told by your health care provider. This is important. Contact a health care provider if:  You have another gout attack.  You continue to have symptoms of a gout attack after10 days of treatment.  You have side effects from your medicines.  You have chills or a fever.  You have burning pain when you urinate.  You have pain in your lower back or belly. Get help right away if:  You have severe or uncontrolled pain.  You cannot urinate. This information is not intended to replace advice given to you by your  health care provider. Make sure you discuss any questions you have with your health care provider. Document Released: 12/31/1999 Document Revised: 06/10/2015 Document Reviewed: 10/15/2014 Elsevier Interactive Patient Education  [employer].

## 2017-12-22 LAB — BASIC METABOLIC PANEL
BUN/Creatinine Ratio: 19 (ref 10–24)
BUN: 28 mg/dL — AB (ref 8–27)
CALCIUM: 9.7 mg/dL (ref 8.6–10.2)
CHLORIDE: 105 mmol/L (ref 96–106)
CO2: 22 mmol/L (ref 20–29)
CREATININE: 1.47 mg/dL — AB (ref 0.76–1.27)
GFR, EST AFRICAN AMERICAN: 55 mL/min/{1.73_m2} — AB (ref >59)
GFR, EST NON AFRICAN AMERICAN: 48 mL/min/{1.73_m2} — AB (ref >59)
Glucose: 99 mg/dL (ref 65–99)
POTASSIUM: 4.4 mmol/L (ref 3.5–5.2)
Sodium: 142 mmol/L (ref 134–144)

## 2017-12-22 LAB — URIC ACID: Uric Acid: 11.1 mg/dL — ABNORMAL HIGH (ref 3.7–8.6)

## 2017-12-31 ENCOUNTER — Encounter: Payer: Self-pay | Admitting: Family Medicine

## 2018-01-17 ENCOUNTER — Other Ambulatory Visit: Payer: Self-pay | Admitting: Nephrology

## 2018-01-17 DIAGNOSIS — N2889 Other specified disorders of kidney and ureter: Secondary | ICD-10-CM

## 2018-01-27 ENCOUNTER — Ambulatory Visit
Admission: RE | Admit: 2018-01-27 | Discharge: 2018-01-27 | Disposition: A | Discharge status: Home / Self Care | Payer: BLUE CROSS/BLUE SHIELD | Source: Ambulatory Visit | Attending: Nephrology | Admitting: Nephrology

## 2018-01-27 DIAGNOSIS — N289 Disorder of kidney and ureter, unspecified: Secondary | ICD-10-CM | POA: Diagnosis not present

## 2018-01-27 DIAGNOSIS — N2889 Other specified disorders of kidney and ureter: Secondary | ICD-10-CM

## 2018-02-01 ENCOUNTER — Other Ambulatory Visit: Payer: Self-pay | Admitting: Family Medicine

## 2018-02-01 DIAGNOSIS — R05 Cough: Secondary | ICD-10-CM

## 2018-02-01 DIAGNOSIS — F17200 Nicotine dependence, unspecified, uncomplicated: Secondary | ICD-10-CM

## 2018-02-01 DIAGNOSIS — R059 Cough, unspecified: Secondary | ICD-10-CM

## 2018-05-24 ENCOUNTER — Other Ambulatory Visit: Payer: Self-pay

## 2018-05-24 ENCOUNTER — Ambulatory Visit (INDEPENDENT_AMBULATORY_CARE_PROVIDER_SITE_OTHER): Payer: BLUE CROSS/BLUE SHIELD | Admitting: Family Medicine

## 2018-05-24 ENCOUNTER — Encounter: Payer: Self-pay | Admitting: Family Medicine

## 2018-05-24 VITALS — BP 128/70 | HR 75 | Temp 97.5°F | Resp 17 | Ht 73.0 in | Wt 230.8 lb

## 2018-05-24 DIAGNOSIS — M10341 Gout due to renal impairment, right hand: Secondary | ICD-10-CM | POA: Diagnosis not present

## 2018-05-24 DIAGNOSIS — M1A39X0 Chronic gout due to renal impairment, multiple sites, without tophus (tophi): Secondary | ICD-10-CM | POA: Diagnosis not present

## 2018-05-24 DIAGNOSIS — J410 Simple chronic bronchitis: Secondary | ICD-10-CM | POA: Diagnosis not present

## 2018-05-24 DIAGNOSIS — F17200 Nicotine dependence, unspecified, uncomplicated: Secondary | ICD-10-CM

## 2018-05-24 MED ORDER — TRAMADOL HCL 50 MG PO TABS: 50.0000 mg | ORAL_TABLET | Freq: Two times a day (BID) | ORAL | 0 refills | Status: AC | PRN

## 2018-05-24 MED ORDER — COLCHICINE 0.6 MG PO TABS: ORAL_TABLET | ORAL | 3 refills | Status: DC

## 2018-05-24 MED ORDER — PREDNISONE 20 MG PO TABS: 40.0000 mg | ORAL_TABLET | Freq: Every day | ORAL | 0 refills | Status: AC
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
## 2018-05-24 NOTE — Patient Instructions (Signed)

## 2018-05-25 LAB — CBC
Hematocrit: 41.4 % (ref 37.5–51.0)
Hemoglobin: 14.4 g/dL (ref 13.0–17.7)
MCH: 33.4 pg — ABNORMAL HIGH (ref 26.6–33.0)
MCHC: 34.8 g/dL (ref 31.5–35.7)
MCV: 96 fL (ref 79–97)
Platelets: 216 10*3/uL (ref 150–450)
RBC: 4.31 x10E6/uL (ref 4.14–5.80)
RDW: 12.8 % (ref 11.6–15.4)
WBC: 6 10*3/uL (ref 3.4–10.8)

## 2018-05-25 LAB — BASIC METABOLIC PANEL
BUN/Creatinine Ratio: 16 (ref 10–24)
BUN: 27 mg/dL (ref 8–27)
CO2: 20 mmol/L (ref 20–29)
Calcium: 9.7 mg/dL (ref 8.6–10.2)
Chloride: 102 mmol/L (ref 96–106)
Creatinine, Ser: 1.69 mg/dL — ABNORMAL HIGH (ref 0.76–1.27)
GFR calc Af Amer: 47 mL/min/{1.73_m2} — ABNORMAL LOW (ref >59)
GFR calc non Af Amer: 40 mL/min/{1.73_m2} — ABNORMAL LOW (ref >59)
Glucose: 117 mg/dL — ABNORMAL HIGH (ref 65–99)
Potassium: 5.2 mmol/L (ref 3.5–5.2)
Sodium: 137 mmol/L (ref 134–144)

## 2018-05-25 LAB — URIC ACID: Uric Acid: 11.6 mg/dL — ABNORMAL HIGH (ref 3.7–8.6)

## 2018-08-16 DIAGNOSIS — N183 Chronic kidney disease, stage 3 (moderate): Secondary | ICD-10-CM | POA: Diagnosis not present

## 2018-08-16 DIAGNOSIS — D472 Monoclonal gammopathy: Secondary | ICD-10-CM | POA: Diagnosis not present

## 2018-08-22 ENCOUNTER — Other Ambulatory Visit: Payer: Self-pay | Admitting: Family Medicine

## 2018-08-22 DIAGNOSIS — J410 Simple chronic bronchitis: Secondary | ICD-10-CM

## 2018-08-23 ENCOUNTER — Other Ambulatory Visit: Payer: Self-pay | Admitting: Emergency Medicine

## 2018-08-23 ENCOUNTER — Other Ambulatory Visit: Payer: Self-pay | Admitting: Family Medicine

## 2018-08-23 DIAGNOSIS — N183 Chronic kidney disease, stage 3 (moderate): Secondary | ICD-10-CM | POA: Diagnosis not present

## 2018-08-23 DIAGNOSIS — E119 Type 2 diabetes mellitus without complications: Secondary | ICD-10-CM

## 2018-08-23 DIAGNOSIS — I10 Essential (primary) hypertension: Secondary | ICD-10-CM | POA: Diagnosis not present

## 2018-08-23 DIAGNOSIS — D472 Monoclonal gammopathy: Secondary | ICD-10-CM | POA: Diagnosis not present

## 2018-08-23 DIAGNOSIS — N281 Cyst of kidney, acquired: Secondary | ICD-10-CM | POA: Diagnosis not present

## 2018-08-23 NOTE — Telephone Encounter (Signed)
Forwarding medication refill request to clinical pool for review. 

## 2018-08-30 ENCOUNTER — Ambulatory Visit (INDEPENDENT_AMBULATORY_CARE_PROVIDER_SITE_OTHER): Payer: BC Managed Care – PPO | Admitting: Family Medicine

## 2018-08-30 ENCOUNTER — Encounter: Payer: Self-pay | Admitting: Family Medicine

## 2018-08-30 ENCOUNTER — Telehealth: Payer: Self-pay

## 2018-08-30 ENCOUNTER — Other Ambulatory Visit: Payer: Self-pay

## 2018-08-30 ENCOUNTER — Ambulatory Visit (INDEPENDENT_AMBULATORY_CARE_PROVIDER_SITE_OTHER): Payer: BC Managed Care – PPO

## 2018-08-30 VITALS — BP 128/74 | HR 78 | Temp 98.5°F | Resp 17 | Ht 73.0 in | Wt 231.6 lb

## 2018-08-30 DIAGNOSIS — J410 Simple chronic bronchitis: Secondary | ICD-10-CM | POA: Diagnosis not present

## 2018-08-30 DIAGNOSIS — M1A39X0 Chronic gout due to renal impairment, multiple sites, without tophus (tophi): Secondary | ICD-10-CM | POA: Diagnosis not present

## 2018-08-30 DIAGNOSIS — M25531 Pain in right wrist: Secondary | ICD-10-CM

## 2018-08-30 DIAGNOSIS — F17200 Nicotine dependence, unspecified, uncomplicated: Secondary | ICD-10-CM

## 2018-08-30 MED ORDER — PREDNISONE 20 MG PO TABS: 40.0000 mg | ORAL_TABLET | Freq: Every day | ORAL | 0 refills | Status: AC

## 2018-08-30 NOTE — Patient Instructions (Addendum)
If you have lab work done today you will be contacted with your lab results within the next 2 weeks.  If you have not heard from [place] then please contact us. The fastest way to get your results is to register for My Chart.   IF you received an x-ray today, you will receive an invoice from [facility]. Please contact [facility] at [phone] with questions or concerns regarding your invoice.   IF you received labwork today, you will receive an invoice from [facility]. Please contact [facility] at [phone] with questions or concerns regarding your invoice.   Our billing staff will not be able to assist you with questions regarding bills from these companies.  You will be contacted with the lab results as soon as they are available. The fastest way to get your results is to activate your My Chart account. Instructions are located on the last page of this paperwork. If you have not heard from [place] regarding the results in 2 weeks, please contact this office.     Gout  Gout is a condition that causes painful swelling of the joints. Gout is a type of inflammation of the joints (arthritis). This condition is caused by having too much uric acid in the body. Uric acid is a chemical that forms when the body breaks down substances called purines. Purines are important for building body proteins. When the body has too much uric acid, sharp crystals can form and build up inside the joints. This causes pain and swelling. Gout attacks can happen quickly and may be very painful (acute gout). Over time, the attacks can affect more joints and become more frequent (chronic gout). Gout can also cause uric acid to build up under the skin and inside the kidneys. What are the causes? This condition is caused by too much uric acid in your blood. This can happen because:  Your kidneys do not remove enough uric acid from your blood. This is the most common cause.  Your body makes too much uric  acid. This can happen with some cancers and cancer treatments. It can also occur if your body is breaking down too many red blood cells (hemolytic anemia).  You eat too many foods that are high in purines. These foods include organ meats and some seafood. Alcohol, especially beer, is also high in purines. A gout attack may be triggered by trauma or stress. What increases the risk? You are more likely to develop this condition if you:  Have a family history of gout.  Are male and middle-aged.  Are male and have gone through menopause.  Are obese.  Frequently drink alcohol, especially beer.  Are dehydrated.  Lose weight too quickly.  Have an organ transplant.  Have lead poisoning.  Take certain medicines, including aspirin, cyclosporine, diuretics, levodopa, and niacin.  Have kidney disease.  Have a skin condition called psoriasis. What are the signs or symptoms? An attack of acute gout happens quickly. It usually occurs in just one joint. The most common place is the big toe. Attacks often start at night. Other joints that may be affected include joints of the feet, ankle, knee, fingers, wrist, or elbow. Symptoms of this condition may include:  Severe pain.  Warmth.  Swelling.  Stiffness.  Tenderness. The affected joint may be very painful to touch.  Shiny, red, or purple skin.  Chills and fever. Chronic gout may cause symptoms more frequently. More joints may be involved. You may also have white  or yellow lumps (tophi) on your hands or feet or in other areas near your joints. How is this diagnosed? This condition is diagnosed based on your symptoms, medical history, and physical exam. You may have tests, such as:  Blood tests to measure uric acid levels.  Removal of joint fluid with a thin needle (aspiration) to look for uric acid crystals.  X-rays to look for joint damage. How is this treated? Treatment for this condition has two phases: treating an acute  attack and preventing future attacks. Acute gout treatment may include medicines to reduce pain and swelling, including:  NSAIDs.  Steroids. These are strong anti-inflammatory medicines that can be taken by mouth (orally) or injected into a joint.  Colchicine. This medicine relieves pain and swelling when it is taken soon after an attack. It can be given by mouth or through an IV. Preventive treatment may include:  Daily use of smaller doses of NSAIDs or colchicine.  Use of a medicine that reduces uric acid levels in your blood.  Changes to your diet. You may need to see a dietitian about what to eat and drink to prevent gout. Follow these instructions at home: During a gout attack   If directed, put ice on the affected area: ? Put ice in a plastic bag. ? Place a towel between your skin and the bag. ? Leave the ice on for 20 minutes, 2-3 times a day.  Raise (elevate) the affected joint above the level of your heart as often as possible.  Rest the joint as much as possible. If the affected joint is in your leg, you may be given crutches to use.  Follow instructions from your health care provider about eating or drinking restrictions. Avoiding future gout attacks  Follow a low-purine diet as told by your dietitian or health care provider. Avoid foods and drinks that are high in purines, including liver, kidney, anchovies, asparagus, herring, mushrooms, mussels, and beer.  Maintain a healthy weight or lose weight if you are overweight. If you want to lose weight, talk with your health care provider. It is important that you do not lose weight too quickly.  Start or maintain an exercise program as told by your health care provider. Eating and drinking  Drink enough fluids to keep your urine pale yellow.  If you drink alcohol: ? Limit how much you use to:  0-1 drink a day for women.  0-2 drinks a day for men. ? Be aware of how much alcohol is in your drink. In the [place] one  drink equals one 12 oz bottle of beer (355 mL) one 5 oz glass of wine (148 mL), or one 1 oz glass of hard liquor (44 mL). General instructions  Take over-the-counter and prescription medicines only as told by your health care provider.  Do not drive or use heavy machinery while taking prescription pain medicine.  Return to your normal activities as told by your health care provider. Ask your health care provider what activities are safe for you.  Keep all follow-up visits as told by your health care provider. This is important. Contact a health care provider if you have:  Another gout attack.  Continuing symptoms of a gout attack after 10 days of treatment.  Side effects from your medicines.  Chills or a fever.  Burning pain when you urinate.  Pain in your lower back or belly. Get help right away if you:  Have severe or uncontrolled pain.  Cannot urinate. Summary  Gout is painful swelling of the joints caused by inflammation.  The most common site of pain is the big toe, but it can affect other joints in the body.  Medicines and dietary changes can help to prevent and treat gout attacks. This information is not intended to replace advice given to you by your health care provider. Make sure you discuss any questions you have with your health care provider. Document Released: 12/31/1999 Document Revised: 07/25/2017 Document Reviewed: 07/25/2017 Elsevier Patient Education  2020 [employer].

## 2018-08-30 NOTE — Telephone Encounter (Signed)
Attempted to call pt to relay results. No answer, left message to call back.   Plan: relay results from provider below on imaging
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
## 2018-08-30 NOTE — Telephone Encounter (Signed)
-----   Message from Forrest Moron, MD sent at 08/30/2018  9:12 AM EDT ----- Please notify the patient that his xray did not show any fractures or changes.  He should continue his gout treatment.

## 2018-09-09 ENCOUNTER — Telehealth: Payer: Self-pay | Admitting: *Deleted

## 2018-09-09 NOTE — Telephone Encounter (Signed)
Spoke with wife advised of lab results.  She is on [place].  Wife voiced understanding.

## 2018-09-13 ENCOUNTER — Other Ambulatory Visit: Payer: Self-pay

## 2018-09-13 ENCOUNTER — Encounter: Payer: Self-pay | Admitting: Family Medicine

## 2018-09-13 ENCOUNTER — Ambulatory Visit (INDEPENDENT_AMBULATORY_CARE_PROVIDER_SITE_OTHER): Payer: BC Managed Care – PPO | Admitting: Family Medicine

## 2018-09-13 VITALS — BP 155/102 | HR 98 | Temp 98.6°F | Ht 73.0 in | Wt 233.6 lb

## 2018-09-13 DIAGNOSIS — M109 Gout, unspecified: Secondary | ICD-10-CM

## 2018-09-13 DIAGNOSIS — M7021 Olecranon bursitis, right elbow: Secondary | ICD-10-CM | POA: Diagnosis not present

## 2018-09-13 MED ORDER — PREDNISONE 20 MG PO TABS: ORAL_TABLET | ORAL | 0 refills | Status: DC

## 2018-09-13 NOTE — Patient Instructions (Addendum)
  Take prednisone 3 daily for 2 days, then 2 daily for 2 days, then 1 daily for 2 days, then one half daily.  If you are getting worse at any time, on any get rechecked.  Your labs are pending still.  Once the acute gout is come down, you need to be started on some other medicine to get your uric acid down.  You need to return in 1 week.   If you have lab work done today you will be contacted with your lab results within the next 2 weeks.  If you have not heard from [place] then please contact us. The fastest way to get your results is to register for My Chart.   IF you received an x-ray today, you will receive an invoice from [facility]. Please contact [facility] at [phone] with questions or concerns regarding your invoice.   IF you received labwork today, you will receive an invoice from [facility]. Please contact [facility] at [phone] with questions or concerns regarding your invoice.   Our billing staff will not be able to assist you with questions regarding bills from these companies.  You will be contacted with the lab results as soon as they are available. The fastest way to get your results is to activate your My Chart account. Instructions are located on the last page of this paperwork. If you have not heard from [place] regarding the results in 2 weeks, please contact this office.

## 2018-09-13 NOTE — Progress Notes (Signed)
Patient ID: John Blake, male    DOB: [birth year]  Age: 73 y.o. MRN: [id number]  Chief Complaint  Patient presents with  . right elbow pain and swelling    going on over a week and now puff, Dull pain  . lab results    Subjective:   73 year old man who comes in here with a swollen right elbow and arm.  He has been treated recently with the same for gout.  He continues to have problems he.  He had had some problems in his feet and hand, especially on the right but some on the left.  He now has a lot of swelling that is taken place up in the right elbow and the entire forearm.  No injury.  He works as a Animal nutritionist.  Current allergies, medications, problem list, past/family and social histories reviewed.  Objective:  BP (!) 155/102 (BP Location: Left Arm, Patient Position: Sitting, Cuff Size: Large)   Pulse 98   Temp 98.6 F (37 C) (Oral)   Ht 6\' 1"  (1.854 m)   Wt 233 lb 9.6 oz (106 kg)   SpO2 98%   BMI 30.82 kg/m   Obvious swelling of the right elbow on the olecranon bursa.  The arm is edematous.  The right hand has a slight bluish discoloration to it compared to the left.  He has decreased grip from the swelling and some discomfort but not acute redness or pain.  Procedure note: The right elbow with prepped with Betadine and alcohol.  A 22-gauge needle was used to aspirate about 4 cc of fairly clear, slightly cloudy mildly yellowish fluid from the elbow.  From the olecranon bursa.  Ethyl chloride was used to anesthetize it.  Patient tolerated well.  Assessment & Plan:   Assessment: 1. Acute gout of multiple sites, unspecified cause   2. Olecranon bursitis of right elbow       Plan: This appears to be bad gout.  We will treat him accordingly.  See instructions  See instructions.  Orders Placed This Encounter  Procedures  . Body fluid culture  . Basic metabolic panel  . Uric acid    Meds ordered this encounter  Medications  . predniSONE (DELTASONE) 20 MG  tablet    Sig: Take 3 daily for 2 days, then 2 daily for 2 days, then 1 daily for 2 days, then one half daily for gout    Dispense:  14 tablet    Refill:  0         Patient Instructions    Take prednisone 3 daily for 2 days, then 2 daily for 2 days, then 1 daily for 2 days, then one half daily.  If you are getting worse at any time, on any get rechecked.  Your labs are pending still.  Once the acute gout is come down, you need to be started on some other medicine to get your uric acid down.  You need to return in 1 week.   If you have lab work done today you will be contacted with your lab results within the next 2 weeks.  If you have not heard from [place] then please contact us. The fastest way to get your results is to register for My Chart.   IF you received an x-ray today, you will receive an invoice from [facility]. Please contact [facility] at [phone] with questions or concerns regarding your invoice.   IF you received labwork today, you will receive an  invoice from [facility]. Please contact [facility] at [phone] with questions or concerns regarding your invoice.   Our billing staff will not be able to assist you with questions regarding bills from these companies.  You will be contacted with the lab results as soon as they are available. The fastest way to get your results is to activate your My Chart account. Instructions are located on the last page of this paperwork. If you have not heard from [place] regarding the results in 2 weeks, please contact this office.         Return [place], for Management of gout.   Ruben Reason, MD 09/13/2018

## 2018-09-14 LAB — BASIC METABOLIC PANEL
BUN/Creatinine Ratio: 16 (ref 10–24)
BUN: 23 mg/dL (ref 8–27)
CO2: 20 mmol/L (ref 20–29)
Calcium: 9.6 mg/dL (ref 8.6–10.2)
Chloride: 103 mmol/L (ref 96–106)
Creatinine, Ser: 1.42 mg/dL — ABNORMAL HIGH (ref 0.76–1.27)
GFR calc Af Amer: 57 mL/min/{1.73_m2} — ABNORMAL LOW (ref >59)
GFR calc non Af Amer: 49 mL/min/{1.73_m2} — ABNORMAL LOW (ref >59)
Glucose: 105 mg/dL — ABNORMAL HIGH (ref 65–99)
Potassium: 4.8 mmol/L (ref 3.5–5.2)
Sodium: 141 mmol/L (ref 134–144)

## 2018-09-14 LAB — URIC ACID: Uric Acid: 9.9 mg/dL — ABNORMAL HIGH (ref 3.7–8.6)

## 2018-09-17 ENCOUNTER — Other Ambulatory Visit: Payer: Self-pay | Admitting: Family Medicine

## 2018-09-17 DIAGNOSIS — E119 Type 2 diabetes mellitus without complications: Secondary | ICD-10-CM

## 2018-09-17 LAB — BODY FLUID CULTURE

## 2018-09-17 NOTE — Telephone Encounter (Signed)
Requested medication (s) are due for refill today: yes  Requested medication (s) are on the active medication list:yes  Last refill: 08/26/2018  Future visit scheduled: yes  Notes to clinic: review for refill Patient schedule on 10/10/2018   Requested Prescriptions  Pending Prescriptions Disp Refills   atorvastatin (LIPITOR) 20 MG tablet [Pharmacy Med Name: ATORVASTATIN 20 MG TABLET] 30 tablet 0    Sig: TAKE 1 TABLET (20 MG TOTAL) BY MOUTH EVERY MORNING. OFFICE VISIT NEEDED     Cardiovascular:  Antilipid - Statins Failed - 09/17/2018 10:32 AM      Failed - Total Cholesterol in normal range and within 360 days    Cholesterol, Total  Date Value Ref Range Status  08/24/2017 103 100 - 199 mg/dL Final         Failed - LDL in normal range and within 360 days    LDL Calculated  Date Value Ref Range Status  08/24/2017 Comment (A) 0 - 99 mg/dL Final    Comment:    Negative value obtained on calculation. Call laboratory to verify component results.          Failed - HDL in normal range and within 360 days    HDL  Date Value Ref Range Status  08/24/2017 57 >39 mg/dL Final         Failed - Triglycerides in normal range and within 360 days    Triglycerides  Date Value Ref Range Status  08/24/2017 254 (H) 0 - 149 mg/dL Final         Passed - Patient is not pregnant      Passed - Valid encounter within last 12 months    Recent Outpatient Visits          4 days ago Acute gout of multiple sites, unspecified cause   Primary Care at [facility], Fenton Malling, MD   2 weeks ago Chronic gout due to renal impairment of multiple sites without tophus   Primary Care at [facility], [place], MD   3 months ago Acute gout due to renal impairment involving right hand   Primary Care at [facility], Arlie Solomons, MD   9 months ago Great toe pain, right   Primary Care at [facility], [place], MD   9 months ago Acute gout involving toe of right foot, unspecified cause   Primary Care at [facility], Ines Bloomer, MD      Future Appointments            In 3 days Linna Darner, Fenton Malling, MD Primary Care at [place], [facility]

## 2018-09-20 ENCOUNTER — Encounter: Payer: Self-pay | Admitting: Family Medicine

## 2018-09-20 ENCOUNTER — Other Ambulatory Visit: Payer: Self-pay

## 2018-09-20 ENCOUNTER — Ambulatory Visit (INDEPENDENT_AMBULATORY_CARE_PROVIDER_SITE_OTHER): Payer: BC Managed Care – PPO | Admitting: Family Medicine

## 2018-09-20 VITALS — BP 144/80 | HR 78 | Temp 97.8°F | Ht 72.0 in | Wt 234.8 lb

## 2018-09-20 DIAGNOSIS — M109 Gout, unspecified: Secondary | ICD-10-CM

## 2018-09-20 DIAGNOSIS — M7021 Olecranon bursitis, right elbow: Secondary | ICD-10-CM | POA: Diagnosis not present

## 2018-09-20 MED ORDER — ALLOPURINOL 100 MG PO TABS: 100.0000 mg | ORAL_TABLET | Freq: Every day | ORAL | 6 refills | Status: DC

## 2018-09-20 NOTE — Progress Notes (Signed)
Patient ID: John Blake, male    DOB: 1945/10/15  Age: 73 y.o. MRN: [id number]  Chief Complaint  Patient presents with  . Gout    1 week f/u     Subjective:   Patient's hand and arm is feeling much better he still has a little tingling sensation in it.  The foot feels fine.  Reviewed his labs with him.  Current allergies, medications, problem list, past/family and social histories reviewed.  Objective:  BP (!) 144/80 (BP Location: Right Arm, Patient Position: Sitting, Cuff Size: Normal)   Pulse 78   Temp 97.8 F (36.6 C) (Oral)   Ht 6' (1.829 m)   Wt 234 lb 12.8 oz (106.5 kg)   SpO2 96%   BMI 31.84 kg/m   Less swelling in the hand and arm.  The olecranon bursa has gone down.  No real tenderness.  Assessment & Plan:   Assessment: 1. Gouty arthritis   2. Olecranon bursitis of right elbow       Plan: Gout is much better.  Will place on allopurinol.  Return in 3 months for liver enzymes, uric acid, and recheck with Dr. Nolon Rod.  Orders Placed This Encounter  Procedures  . Comprehensive metabolic panel    Standing Status:   Future    Standing Expiration Date:   09/20/2019  . Uric acid    Standing Status:   Future    Standing Expiration Date:   09/20/2019    Meds ordered this encounter  Medications  . allopurinol (ZYLOPRIM) 100 MG tablet    Sig: Take 1 tablet (100 mg total) by mouth daily.    Dispense:  30 tablet    Refill:  6         Patient Instructions     Return in 3 months for labs and to follow-up a few days later with Dr. Nolon Rod.  Take allopurinol 100 mg 1 daily.  Return if acute problems before then.  If you have lab work done today you will be contacted with your lab results within the next 2 weeks.  If you have not heard from [place] then please contact us. The fastest way to get your results is to register for My Chart.   IF you received an x-ray today, you will receive an invoice from [facility]. Please contact [facility]  Radiology at [phone] with questions or concerns regarding your invoice.   IF you received labwork today, you will receive an invoice from [place]. Please contact [facility] at [phone] with questions or concerns regarding your invoice.   Our billing staff will not be able to assist you with questions regarding bills from these companies.  You will be contacted with the lab results as soon as they are available. The fastest way to get your results is to activate your My Chart account. Instructions are located on the last page of this paperwork. If you have not heard from [place] regarding the results in 2 weeks, please contact this office.         Return in about 3 months (around 12/20/2018), or [place], for hyperuricemia follow up'.   Ruben Reason, MD 09/20/2018

## 2018-09-20 NOTE — Patient Instructions (Addendum)
   Return in 3 months for labs and to follow-up a few days later with Dr. Nolon Rod.  Take allopurinol 100 mg 1 daily.  Return if acute problems before then.  If you have lab work done today you will be contacted with your lab results within the next 2 weeks.  If you have not heard from [place] then please contact us. The fastest way to get your results is to register for My Chart.   IF you received an x-ray today, you will receive an invoice from [facility]. Please contact [facility] Radiology at [phone] with questions or concerns regarding your invoice.   IF you received labwork today, you will receive an invoice from [place]. Please contact [facility] at [phone] with questions or concerns regarding your invoice.   Our billing staff will not be able to assist you with questions regarding bills from these companies.  You will be contacted with the lab results as soon as they are available. The fastest way to get your results is to activate your My Chart account. Instructions are located on the last page of this paperwork. If you have not heard from [place] regarding the results in 2 weeks, please contact this office.

## 2018-09-30 ENCOUNTER — Encounter: Payer: Self-pay | Admitting: Radiology

## 2018-10-12 ENCOUNTER — Other Ambulatory Visit: Payer: Self-pay | Admitting: Family Medicine

## 2018-10-12 DIAGNOSIS — E119 Type 2 diabetes mellitus without complications: Secondary | ICD-10-CM

## 2018-10-12 DIAGNOSIS — I10 Essential (primary) hypertension: Secondary | ICD-10-CM

## 2018-10-21 ENCOUNTER — Ambulatory Visit (INDEPENDENT_AMBULATORY_CARE_PROVIDER_SITE_OTHER): Payer: BC Managed Care – PPO | Admitting: Registered Nurse

## 2018-10-21 ENCOUNTER — Encounter: Payer: Self-pay | Admitting: Registered Nurse

## 2018-10-21 ENCOUNTER — Other Ambulatory Visit: Payer: Self-pay

## 2018-10-21 VITALS — BP 142/72 | HR 72 | Temp 98.3°F | Ht 73.0 in | Wt 236.2 lb

## 2018-10-21 DIAGNOSIS — L720 Epidermal cyst: Secondary | ICD-10-CM

## 2018-10-21 MED ORDER — SULFAMETHOXAZOLE-TRIMETHOPRIM 400-80 MG PO TABS: 1.0000 | ORAL_TABLET | Freq: Two times a day (BID) | ORAL | 0 refills | Status: DC

## 2018-10-21 NOTE — Patient Instructions (Signed)
° ° ° °  If you have lab work done today you will be contacted with your lab results within the next 2 weeks.  If you have not heard from us then please contact us. The fastest way to get your results is to register for My Chart. ° ° °IF you received an x-ray today, you will receive an invoice from [facility]. Please contact [place] [facility] at [phone] with questions or concerns regarding your invoice.  ° °IF you received labwork today, you will receive an invoice from [facility]. Please contact [facility] at [phone] with questions or concerns regarding your invoice.  ° °Our billing staff will not be able to assist you with questions regarding bills from these companies. ° °You will be contacted with the lab results as soon as they are available. The fastest way to get your results is to activate your My Chart account. Instructions are located on the last page of this paperwork. If you have not heard from us regarding the results in 2 weeks, please contact this office. °  ° ° ° °
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
## 2018-10-24 LAB — WOUND CULTURE

## 2018-10-25 ENCOUNTER — Other Ambulatory Visit: Payer: Self-pay | Admitting: Family Medicine

## 2018-10-25 DIAGNOSIS — I10 Essential (primary) hypertension: Secondary | ICD-10-CM

## 2018-11-11 ENCOUNTER — Telehealth: Payer: Self-pay | Admitting: Family Medicine

## 2018-11-11 NOTE — Telephone Encounter (Signed)
Called pt to reschedule appt. Pt's wife answered the phone and stated "he has to make the decisions, I can't participate." I did not ask pt's wife to reschedule appt nor did I state where I was calling from

## 2018-11-11 NOTE — Telephone Encounter (Signed)
Called pt to reschedule appt. No vm box

## 2018-11-26 ENCOUNTER — Ambulatory Visit (INDEPENDENT_AMBULATORY_CARE_PROVIDER_SITE_OTHER): Payer: BC Managed Care – PPO | Admitting: Family Medicine

## 2018-11-26 ENCOUNTER — Other Ambulatory Visit: Payer: Self-pay

## 2018-11-26 DIAGNOSIS — Z23 Encounter for immunization: Secondary | ICD-10-CM | POA: Diagnosis not present

## 2018-11-26 NOTE — Progress Notes (Signed)
Pt came in the office today for high dose flu shot.

## 2018-12-02 ENCOUNTER — Telehealth: Payer: Self-pay | Admitting: Family Medicine

## 2018-12-02 NOTE — Telephone Encounter (Signed)
LVM to r/s appt on 12/20/2018 with Dr. Nolon Rod. Provider will be out of the office on that day

## 2018-12-13 ENCOUNTER — Ambulatory Visit: Payer: BC Managed Care – PPO

## 2018-12-16 ENCOUNTER — Encounter: Payer: Self-pay | Admitting: Family Medicine

## 2018-12-16 ENCOUNTER — Ambulatory Visit (INDEPENDENT_AMBULATORY_CARE_PROVIDER_SITE_OTHER): Payer: BC Managed Care – PPO | Admitting: Family Medicine

## 2018-12-16 ENCOUNTER — Other Ambulatory Visit: Payer: Self-pay

## 2018-12-16 VITALS — BP 132/82 | HR 97 | Temp 98.7°F | Ht 73.0 in | Wt 235.0 lb

## 2018-12-16 DIAGNOSIS — M7021 Olecranon bursitis, right elbow: Secondary | ICD-10-CM | POA: Diagnosis not present

## 2018-12-16 DIAGNOSIS — M10322 Gout due to renal impairment, left elbow: Secondary | ICD-10-CM | POA: Diagnosis not present

## 2018-12-16 DIAGNOSIS — E1165 Type 2 diabetes mellitus with hyperglycemia: Secondary | ICD-10-CM | POA: Diagnosis not present

## 2018-12-16 DIAGNOSIS — M109 Gout, unspecified: Secondary | ICD-10-CM | POA: Diagnosis not present

## 2018-12-16 MED ORDER — COLCHICINE 0.6 MG PO TABS: ORAL_TABLET | ORAL | 3 refills | Status: DC

## 2018-12-16 MED ORDER — PREDNISONE 20 MG PO TABS: ORAL_TABLET | ORAL | 0 refills | Status: DC
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
## 2018-12-16 NOTE — Patient Instructions (Addendum)
   If you have lab work done today you will be contacted with your lab results within the next 2 weeks.  If you have not heard from us then please contact us. The fastest way to get your results is to register for My Chart.   IF you received an x-ray today, you will receive an invoice from [facility]. Please contact [facility] at [phone] with questions or concerns regarding your invoice.   IF you received labwork today, you will receive an invoice from [facility]. Please contact [facility] at [phone] with questions or concerns regarding your invoice.   Our billing staff will not be able to assist you with questions regarding bills from these companies.  You will be contacted with the lab results as soon as they are available. The fastest way to get your results is to activate your My Chart account. Instructions are located on the last page of this paperwork. If you have not heard from us regarding the results in 2 weeks, please contact this office.      Low-Purine Eating Plan A low-purine eating plan involves making food choices to limit your intake of purine. Purine is a kind of uric acid. Too much uric acid in your blood can cause certain conditions, such as gout and kidney stones. Eating a low-purine diet can help control these conditions. What are tips for following this plan? Reading food labels   Avoid foods with saturated or Trans fat.  Check the ingredient list of grains-based foods, such as bread and cereal, to make sure that they contain whole grains.  Check the ingredient list of sauces or soups to make sure they do not contain meat or fish.  When choosing soft drinks, check the ingredient list to make sure they do not contain high-fructose corn syrup. Shopping  Buy plenty of fresh fruits and vegetables.  Avoid buying canned or fresh fish.  Buy dairy products labeled as low-fat or nonfat.  Avoid buying premade or processed foods. These  foods are often high in fat, salt (sodium), and added sugar. Cooking  Use olive oil instead of butter when cooking. Oils like olive oil, canola oil, and sunflower oil contain healthy fats. Meal planning  Learn which foods do or do not affect you. If you find out that a food tends to cause your gout symptoms to flare up, avoid eating that food. You can enjoy foods that do not cause problems. If you have any questions about a food item, talk with your dietitian or health care provider.  Limit foods high in fat, especially saturated fat. Fat makes it harder for your body to get rid of uric acid.  Choose foods that are lower in fat and are lean sources of protein. General guidelines  Limit alcohol intake to no more than 1 drink a day for nonpregnant women and 2 drinks a day for men. One drink equals 12 oz of beer, 5 oz of wine, or 1 oz of hard liquor. Alcohol can affect the way your body gets rid of uric acid.  Drink plenty of water to keep your urine clear or pale yellow. Fluids can help remove uric acid from your body.  If directed by your health care provider, take a vitamin C supplement.  Work with your health care provider and dietitian to develop a plan to achieve or maintain a healthy weight. Losing weight can help reduce uric acid in your blood. What foods are recommended? The items listed may not be   complete list. Talk with your dietitian about what dietary choices are best for you. Foods low in purines Foods low in purines do not need to be limited. These include:  All fruits.  All low-purine vegetables, pickles, and olives.  Breads, pasta, rice, cornbread, and popcorn. Cake and other baked goods.  All dairy foods.  Eggs, nuts, and nut butters.  Spices and condiments, such as salt, herbs, and vinegar.  Plant oils, butter, and margarine.  Water, sugar-free soft drinks, tea, coffee, and cocoa.  Vegetable-based soups, broths, sauces, and gravies. Foods moderate in  purines Foods moderate in purines should be limited to the amounts listed.   cup of asparagus, cauliflower, spinach, mushrooms, or green peas, each day.  2/3 cup uncooked oatmeal, each day.   cup dry wheat bran or wheat germ, each day.  2-3 ounces of meat or poultry, each day.  4-6 ounces of shellfish, such as crab, lobster, oysters, or shrimp, each day.  1 cup cooked beans, peas, or lentils, each day.  Soup, broths, or bouillon made from meat or fish. Limit these foods as much as possible. What foods are not recommended? The items listed may not be a complete list. Talk with your dietitian about what dietary choices are best for you. Limit your intake of foods high in purines, including:  Beer and other alcohol.  Meat-based gravy or sauce.  Canned or fresh fish, such as: ? Anchovies, sardines, herring, and tuna. ? Mussels and scallops. ? Codfish, trout, and haddock.  [name].  Organ meats, such as: ? Liver or kidney. ? Tripe. ? Sweetbreads (thymus gland or pancreas).  Wild [name].  Yeast or yeast extract supplements.  Drinks sweetened with high-fructose corn syrup. Summary  Eating a low-purine diet can help control conditions caused by too much uric acid in the body, such as gout or kidney stones.  Choose low-purine foods, limit alcohol, and limit foods high in fat.  You will learn over time which foods do or do not affect you. If you find out that a food tends to cause your gout symptoms to flare up, avoid eating that food. This information is not intended to replace advice given to you by your health care provider. Make sure you discuss any questions you have with your health care provider. Document Released: 04/29/2010 Document Revised: 12/15/2016 Document Reviewed: 02/16/2016 Elsevier Patient Education  2020 [employer].

## 2018-12-17 LAB — HEMOGLOBIN A1C
Est. average glucose Bld gHb Est-mCnc: 120 mg/dL
Hgb A1c MFr Bld: 5.8 % — ABNORMAL HIGH (ref 4.8–5.6)

## 2018-12-17 LAB — COMPREHENSIVE METABOLIC PANEL
ALT: 11 IU/L (ref 0–44)
AST: 11 IU/L (ref 0–40)
Albumin/Globulin Ratio: 1.8 (ref 1.2–2.2)
Albumin: 4.2 g/dL (ref 3.7–4.7)
Alkaline Phosphatase: 46 IU/L (ref 39–117)
BUN/Creatinine Ratio: 17 (ref 10–24)
BUN: 24 mg/dL (ref 8–27)
Bilirubin Total: 0.7 mg/dL (ref 0.0–1.2)
CO2: 21 mmol/L (ref 20–29)
Calcium: 9.2 mg/dL (ref 8.6–10.2)
Chloride: 102 mmol/L (ref 96–106)
Creatinine, Ser: 1.44 mg/dL — ABNORMAL HIGH (ref 0.76–1.27)
GFR calc Af Amer: 56 mL/min/{1.73_m2} — ABNORMAL LOW (ref >59)
GFR calc non Af Amer: 49 mL/min/{1.73_m2} — ABNORMAL LOW (ref >59)
Globulin, Total: 2.3 g/dL (ref 1.5–4.5)
Glucose: 120 mg/dL — ABNORMAL HIGH (ref 65–99)
Potassium: 4.5 mmol/L (ref 3.5–5.2)
Sodium: 141 mmol/L (ref 134–144)
Total Protein: 6.5 g/dL (ref 6.0–8.5)

## 2018-12-17 LAB — URIC ACID: Uric Acid: 7.6 mg/dL (ref 3.7–8.6)

## 2018-12-17 NOTE — Progress Notes (Signed)
FYI you have an appt with him on dec 18th

## 2018-12-18 ENCOUNTER — Ambulatory Visit: Payer: BC Managed Care – PPO | Admitting: Family Medicine

## 2018-12-20 ENCOUNTER — Ambulatory Visit: Payer: BC Managed Care – PPO | Admitting: Family Medicine

## 2019-01-03 ENCOUNTER — Encounter: Payer: Self-pay | Admitting: Family Medicine

## 2019-01-03 ENCOUNTER — Ambulatory Visit (INDEPENDENT_AMBULATORY_CARE_PROVIDER_SITE_OTHER): Payer: BC Managed Care – PPO | Admitting: Family Medicine

## 2019-01-03 ENCOUNTER — Other Ambulatory Visit: Payer: Self-pay

## 2019-01-03 VITALS — BP 136/68 | HR 78 | Temp 98.8°F | Resp 17 | Ht 73.0 in | Wt 240.6 lb

## 2019-01-03 DIAGNOSIS — R059 Cough, unspecified: Secondary | ICD-10-CM

## 2019-01-03 DIAGNOSIS — R05 Cough: Secondary | ICD-10-CM

## 2019-01-03 DIAGNOSIS — I10 Essential (primary) hypertension: Secondary | ICD-10-CM

## 2019-01-03 DIAGNOSIS — M10322 Gout due to renal impairment, left elbow: Secondary | ICD-10-CM

## 2019-01-03 DIAGNOSIS — E1165 Type 2 diabetes mellitus with hyperglycemia: Secondary | ICD-10-CM | POA: Diagnosis not present

## 2019-01-03 DIAGNOSIS — M109 Gout, unspecified: Secondary | ICD-10-CM

## 2019-01-03 DIAGNOSIS — E790 Hyperuricemia without signs of inflammatory arthritis and tophaceous disease: Secondary | ICD-10-CM

## 2019-01-03 DIAGNOSIS — F17200 Nicotine dependence, unspecified, uncomplicated: Secondary | ICD-10-CM

## 2019-01-03 MED ORDER — COLCHICINE 0.6 MG PO TABS: ORAL_TABLET | ORAL | 3 refills | Status: DC

## 2019-01-03 MED ORDER — VALSARTAN-HYDROCHLOROTHIAZIDE 320-25 MG PO TABS: ORAL_TABLET | ORAL | 0 refills | Status: DC

## 2019-01-03 MED ORDER — ALLOPURINOL 100 MG PO TABS: 100.0000 mg | ORAL_TABLET | Freq: Every day | ORAL | 1 refills | Status: DC

## 2019-01-03 MED ORDER — ALBUTEROL SULFATE HFA 108 (90 BASE) MCG/ACT IN AERS: INHALATION_SPRAY | RESPIRATORY_TRACT | 3 refills | Status: DC

## 2019-01-03 NOTE — Patient Instructions (Addendum)
   If you have lab work done today you will be contacted with your lab results within the next 2 weeks.  If you have not heard from us then please contact us. The fastest way to get your results is to register for My Chart.   IF you received an x-ray today, you will receive an invoice from [facility]. Please contact [facility] at [phone] with questions or concerns regarding your invoice.   IF you received labwork today, you will receive an invoice from [facility]. Please contact [facility] at [phone] with questions or concerns regarding your invoice.   Our billing staff will not be able to assist you with questions regarding bills from these companies.  You will be contacted with the lab results as soon as they are available. The fastest way to get your results is to activate your My Chart account. Instructions are located on the last page of this paperwork. If you have not heard from us regarding the results in 2 weeks, please contact this office.      Low-Purine Eating Plan A low-purine eating plan involves making food choices to limit your intake of purine. Purine is a kind of uric acid. Too much uric acid in your blood can cause certain conditions, such as gout and kidney stones. Eating a low-purine diet can help control these conditions. What are tips for following this plan? Reading food labels   Avoid foods with saturated or Trans fat.  Check the ingredient list of grains-based foods, such as bread and cereal, to make sure that they contain whole grains.  Check the ingredient list of sauces or soups to make sure they do not contain meat or fish.  When choosing soft drinks, check the ingredient list to make sure they do not contain high-fructose corn syrup. Shopping  Buy plenty of fresh fruits and vegetables.  Avoid buying canned or fresh fish.  Buy dairy products labeled as low-fat or nonfat.  Avoid buying premade or processed foods. These  foods are often high in fat, salt (sodium), and added sugar. Cooking  Use olive oil instead of butter when cooking. Oils like olive oil, canola oil, and sunflower oil contain healthy fats. Meal planning  Learn which foods do or do not affect you. If you find out that a food tends to cause your gout symptoms to flare up, avoid eating that food. You can enjoy foods that do not cause problems. If you have any questions about a food item, talk with your dietitian or health care provider.  Limit foods high in fat, especially saturated fat. Fat makes it harder for your body to get rid of uric acid.  Choose foods that are lower in fat and are lean sources of protein. General guidelines  Limit alcohol intake to no more than 1 drink a day for nonpregnant women and 2 drinks a day for men. One drink equals 12 oz of beer, 5 oz of wine, or 1 oz of hard liquor. Alcohol can affect the way your body gets rid of uric acid.  Drink plenty of water to keep your urine clear or pale yellow. Fluids can help remove uric acid from your body.  If directed by your health care provider, take a vitamin C supplement.  Work with your health care provider and dietitian to develop a plan to achieve or maintain a healthy weight. Losing weight can help reduce uric acid in your blood. What foods are recommended? The items listed may not be   complete list. Talk with your dietitian about what dietary choices are best for you. Foods low in purines Foods low in purines do not need to be limited. These include:  All fruits.  All low-purine vegetables, pickles, and olives.  Breads, pasta, rice, cornbread, and popcorn. Cake and other baked goods.  All dairy foods.  Eggs, nuts, and nut butters.  Spices and condiments, such as salt, herbs, and vinegar.  Plant oils, butter, and margarine.  Water, sugar-free soft drinks, tea, coffee, and cocoa.  Vegetable-based soups, broths, sauces, and gravies. Foods moderate in  purines Foods moderate in purines should be limited to the amounts listed.   cup of asparagus, cauliflower, spinach, mushrooms, or green peas, each day.  2/3 cup uncooked oatmeal, each day.   cup dry wheat bran or wheat germ, each day.  2-3 ounces of meat or poultry, each day.  4-6 ounces of shellfish, such as crab, lobster, oysters, or shrimp, each day.  1 cup cooked beans, peas, or lentils, each day.  Soup, broths, or bouillon made from meat or fish. Limit these foods as much as possible. What foods are not recommended? The items listed may not be a complete list. Talk with your dietitian about what dietary choices are best for you. Limit your intake of foods high in purines, including:  Beer and other alcohol.  Meat-based gravy or sauce.  Canned or fresh fish, such as: ? Anchovies, sardines, herring, and tuna. ? Mussels and scallops. ? Codfish, trout, and haddock.  John Blake.  Organ meats, such as: ? Liver or kidney. ? Tripe. ? Sweetbreads (thymus gland or pancreas).  Wild John Blake.  Yeast or yeast extract supplements.  Drinks sweetened with high-fructose corn syrup. Summary  Eating a low-purine diet can help control conditions caused by too much uric acid in the body, such as gout or kidney stones.  Choose low-purine foods, limit alcohol, and limit foods high in fat.  You will learn over time which foods do or do not affect you. If you find out that a food tends to cause your gout symptoms to flare up, avoid eating that food. This information is not intended to replace advice given to you by your health care provider. Make sure you discuss any questions you have with your health care provider. Document Released: 04/29/2010 Document Revised: 12/15/2016 Document Reviewed: 02/16/2016 Elsevier Patient Education  2020 [employer].
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
## 2019-01-04 LAB — COMPREHENSIVE METABOLIC PANEL
ALT: 15 IU/L (ref 0–44)
AST: 16 IU/L (ref 0–40)
Albumin/Globulin Ratio: 2 (ref 1.2–2.2)
Albumin: 4.4 g/dL (ref 3.7–4.7)
Alkaline Phosphatase: 39 IU/L (ref 39–117)
BUN/Creatinine Ratio: 17 (ref 10–24)
BUN: 28 mg/dL — ABNORMAL HIGH (ref 8–27)
Bilirubin Total: 0.4 mg/dL (ref 0.0–1.2)
CO2: 22 mmol/L (ref 20–29)
Calcium: 9.7 mg/dL (ref 8.6–10.2)
Chloride: 105 mmol/L (ref 96–106)
Creatinine, Ser: 1.69 mg/dL — ABNORMAL HIGH (ref 0.76–1.27)
GFR calc Af Amer: 46 mL/min/{1.73_m2} — ABNORMAL LOW (ref >59)
GFR calc non Af Amer: 40 mL/min/{1.73_m2} — ABNORMAL LOW (ref >59)
Globulin, Total: 2.2 g/dL (ref 1.5–4.5)
Glucose: 116 mg/dL — ABNORMAL HIGH (ref 65–99)
Potassium: 5.3 mmol/L — ABNORMAL HIGH (ref 3.5–5.2)
Sodium: 141 mmol/L (ref 134–144)
Total Protein: 6.6 g/dL (ref 6.0–8.5)

## 2019-01-07 ENCOUNTER — Other Ambulatory Visit: Payer: Self-pay | Admitting: Family Medicine

## 2019-01-07 DIAGNOSIS — E119 Type 2 diabetes mellitus without complications: Secondary | ICD-10-CM

## 2019-01-24 ENCOUNTER — Other Ambulatory Visit: Payer: Self-pay

## 2019-02-24 ENCOUNTER — Other Ambulatory Visit: Payer: Self-pay | Admitting: Emergency Medicine

## 2019-03-14 ENCOUNTER — Other Ambulatory Visit: Payer: Self-pay

## 2019-03-14 ENCOUNTER — Encounter: Payer: Self-pay | Admitting: Family Medicine

## 2019-03-14 ENCOUNTER — Ambulatory Visit (INDEPENDENT_AMBULATORY_CARE_PROVIDER_SITE_OTHER): Payer: BC Managed Care – PPO | Admitting: Family Medicine

## 2019-03-14 VITALS — BP 128/80 | HR 87 | Temp 98.0°F | Ht 73.0 in | Wt 246.6 lb

## 2019-03-14 DIAGNOSIS — F17200 Nicotine dependence, unspecified, uncomplicated: Secondary | ICD-10-CM | POA: Diagnosis not present

## 2019-03-14 DIAGNOSIS — M79641 Pain in right hand: Secondary | ICD-10-CM | POA: Diagnosis not present

## 2019-03-14 DIAGNOSIS — R059 Cough, unspecified: Secondary | ICD-10-CM

## 2019-03-14 DIAGNOSIS — R05 Cough: Secondary | ICD-10-CM | POA: Diagnosis not present

## 2019-03-14 MED ORDER — PREDNISONE 20 MG PO TABS: ORAL_TABLET | ORAL | 0 refills | Status: DC

## 2019-03-14 MED ORDER — ALBUTEROL SULFATE HFA 108 (90 BASE) MCG/ACT IN AERS: INHALATION_SPRAY | RESPIRATORY_TRACT | 3 refills | Status: DC

## 2019-03-14 NOTE — Patient Instructions (Signed)
° ° ° °  If you have lab work done today you will be contacted with your lab results within the next 2 weeks.  If you have not heard from us then please contact us. The fastest way to get your results is to register for My Chart. ° ° °IF you received an x-ray today, you will receive an invoice from [facility]. Please contact [facility] at [phone] with questions or concerns regarding your invoice.  ° °IF you received labwork today, you will receive an invoice from [facility]. Please contact [facility] at [phone] with questions or concerns regarding your invoice.  ° °Our billing staff will not be able to assist you with questions regarding bills from these companies. ° °You will be contacted with the lab results as soon as they are available. The fastest way to get your results is to activate your My Chart account. Instructions are located on the last page of this paperwork. If you have not heard from us regarding the results in 2 weeks, please contact this office. °  ° ° ° °
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
## 2019-03-15 LAB — URIC ACID: Uric Acid: 7.6 mg/dL (ref 3.8–8.4)

## 2019-03-21 ENCOUNTER — Other Ambulatory Visit: Payer: Self-pay

## 2019-03-21 ENCOUNTER — Ambulatory Visit: Payer: BC Managed Care – PPO

## 2019-03-21 ENCOUNTER — Ambulatory Visit (INDEPENDENT_AMBULATORY_CARE_PROVIDER_SITE_OTHER): Payer: BC Managed Care – PPO

## 2019-03-21 DIAGNOSIS — M7989 Other specified soft tissue disorders: Secondary | ICD-10-CM | POA: Diagnosis not present

## 2019-03-21 DIAGNOSIS — M79641 Pain in right hand: Secondary | ICD-10-CM

## 2019-03-21 NOTE — Addendum Note (Signed)
Addended by: Anthoney Harada on: 03/21/2019 01:34 PM   Modules accepted: Orders

## 2019-04-04 ENCOUNTER — Other Ambulatory Visit: Payer: Self-pay | Admitting: Family Medicine

## 2019-04-04 DIAGNOSIS — E119 Type 2 diabetes mellitus without complications: Secondary | ICD-10-CM

## 2019-04-04 NOTE — Telephone Encounter (Signed)
Requested  medications are  due for refill today yes  Requested medications are on the active medication list yes  Last refill 12/22  Future visit scheduled no  Notes to clinic Failed protocol due to labs out of date, also med not mentioned in recent office visit.

## 2019-04-10 ENCOUNTER — Ambulatory Visit: Payer: Medicare Other | Attending: Internal Medicine

## 2019-04-10 DIAGNOSIS — Z23 Encounter for immunization: Secondary | ICD-10-CM

## 2019-04-10 NOTE — Progress Notes (Signed)
   Covid-19 Vaccination Clinic  Name:  John Blake    MRN: [id number] DOB: [birth year]  04/10/2019  Mr. John Blake was observed post Covid-19 immunization for 15 minutes without incident. He was provided with Vaccine Information Sheet and instruction to access the V-Safe system.   [name] was instructed to call 911 with any severe reactions post vaccine: Marland Kitchen Difficulty breathing  . Swelling of face and throat  . A fast heartbeat  . A bad rash all over body  . Dizziness and weakness   Immunizations Administered    Name Date Dose VIS Date Route   Pfizer COVID-19 Vaccine 04/10/2019  8:19 AM 0.3 mL 12/27/2018 Intramuscular   Manufacturer: [place]   Lot: [id number]   [place]: [id number]

## 2019-04-11 ENCOUNTER — Other Ambulatory Visit: Payer: Self-pay | Admitting: Family Medicine

## 2019-04-11 DIAGNOSIS — I10 Essential (primary) hypertension: Secondary | ICD-10-CM

## 2019-04-25 ENCOUNTER — Other Ambulatory Visit: Payer: Self-pay | Admitting: Family Medicine

## 2019-04-25 DIAGNOSIS — I10 Essential (primary) hypertension: Secondary | ICD-10-CM

## 2019-05-05 ENCOUNTER — Ambulatory Visit: Payer: Medicare Other | Attending: Internal Medicine

## 2019-05-05 DIAGNOSIS — Z23 Encounter for immunization: Secondary | ICD-10-CM

## 2019-05-05 NOTE — Progress Notes (Signed)
   Covid-19 Vaccination Clinic  Name:  John Blake    MRN: [id number] DOB: 15-Jul-1945  05/05/2019  John Blake was observed post Covid-19 immunization for 15 minutes without incident. He was provided with Vaccine Information Sheet and instruction to access the V-Safe system.   John Blake was instructed to call 911 with any severe reactions post vaccine: Marland Kitchen Difficulty breathing  . Swelling of face and throat  . A fast heartbeat  . A bad rash all over body  . Dizziness and weakness   Immunizations Administered    Name Date Dose VIS Date Route   Pfizer COVID-19 Vaccine 05/05/2019  8:13 AM 0.3 mL 03/12/2018 Intramuscular   Manufacturer: [place]   Lot: [id number]   [place]: [id number]

## 2019-05-16 DIAGNOSIS — H02831 Dermatochalasis of right upper eyelid: Secondary | ICD-10-CM | POA: Diagnosis not present

## 2019-05-16 DIAGNOSIS — H25813 Combined forms of age-related cataract, bilateral: Secondary | ICD-10-CM | POA: Diagnosis not present

## 2019-05-16 DIAGNOSIS — H0102A Squamous blepharitis right eye, upper and lower eyelids: Secondary | ICD-10-CM | POA: Diagnosis not present

## 2019-05-16 DIAGNOSIS — H02834 Dermatochalasis of left upper eyelid: Secondary | ICD-10-CM | POA: Diagnosis not present

## 2019-06-25 ENCOUNTER — Telehealth: Payer: Self-pay | Admitting: *Deleted

## 2019-06-25 ENCOUNTER — Other Ambulatory Visit: Payer: Self-pay | Admitting: Family Medicine

## 2019-06-25 NOTE — Telephone Encounter (Signed)
Schedule AWV.  

## 2019-07-01 ENCOUNTER — Other Ambulatory Visit: Payer: Self-pay

## 2019-07-01 DIAGNOSIS — I10 Essential (primary) hypertension: Secondary | ICD-10-CM

## 2019-07-01 MED ORDER — VALSARTAN-HYDROCHLOROTHIAZIDE 320-25 MG PO TABS: ORAL_TABLET | ORAL | 0 refills | Status: DC

## 2019-07-01 NOTE — Telephone Encounter (Signed)
What is the name of the medication? valsartan-hydrochlorothiazide (DIOVAN-HCT) 320-25 MG tablet [[id number]]    Have you contacted your pharmacy to request a refill? yes  Which pharmacy would you like this sent to? [id number] [address], [place], [place] [address]    Patient notified that their request is being sent to the clinical staff for review and that they should receive a call once it is complete. If they do not receive a call within 72 hours they can check with their pharmacy or our office.

## 2019-07-01 NOTE — Telephone Encounter (Signed)
Courtesy refill sent to the pharmacy Patient has an appointment 07/04/2019 with Maximiano Coss.

## 2019-07-04 ENCOUNTER — Encounter: Payer: Self-pay | Admitting: Registered Nurse

## 2019-07-04 ENCOUNTER — Telehealth (INDEPENDENT_AMBULATORY_CARE_PROVIDER_SITE_OTHER): Payer: BC Managed Care – PPO | Admitting: Registered Nurse

## 2019-07-04 ENCOUNTER — Other Ambulatory Visit: Payer: Self-pay

## 2019-07-04 VITALS — Ht 72.0 in | Wt 235.0 lb

## 2019-07-04 DIAGNOSIS — M62830 Muscle spasm of back: Secondary | ICD-10-CM

## 2019-07-04 DIAGNOSIS — R05 Cough: Secondary | ICD-10-CM | POA: Diagnosis not present

## 2019-07-04 DIAGNOSIS — R059 Cough, unspecified: Secondary | ICD-10-CM

## 2019-07-04 MED ORDER — MONTELUKAST SODIUM 10 MG PO TABS: 10.0000 mg | ORAL_TABLET | Freq: Every day | ORAL | 3 refills | Status: DC

## 2019-07-04 MED ORDER — PREDNISONE 10 MG PO TABS: ORAL_TABLET | ORAL | 0 refills | Status: AC

## 2019-07-04 MED ORDER — MECLIZINE HCL 12.5 MG PO TABS: 12.5000 mg | ORAL_TABLET | Freq: Three times a day (TID) | ORAL | 0 refills | Status: DC | PRN

## 2019-07-04 NOTE — Patient Instructions (Signed)
° ° ° °  If you have lab work done today you will be contacted with your lab results within the next 2 weeks.  If you have not heard from us then please contact us. The fastest way to get your results is to register for My Chart. ° ° °IF you received an x-ray today, you will receive an invoice from [facility]. Please contact [facility] at [phone] with questions or concerns regarding your invoice.  ° °IF you received labwork today, you will receive an invoice from [facility]. Please contact [facility] at [phone] with questions or concerns regarding your invoice.  ° °Our billing staff will not be able to assist you with questions regarding bills from these companies. ° °You will be contacted with the lab results as soon as they are available. The fastest way to get your results is to activate your My Chart account. Instructions are located on the last page of this paperwork. If you have not heard from us regarding the results in 2 weeks, please contact this office. °  ° ° ° °
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
## 2019-07-10 ENCOUNTER — Telehealth: Payer: Self-pay | Admitting: Registered Nurse

## 2019-07-19 ENCOUNTER — Other Ambulatory Visit: Payer: Self-pay | Admitting: Family Medicine

## 2019-07-19 DIAGNOSIS — R059 Cough, unspecified: Secondary | ICD-10-CM

## 2019-07-19 NOTE — Telephone Encounter (Signed)
Requested Prescriptions  Pending Prescriptions Disp Refills  . albuterol (VENTOLIN HFA) 108 (90 Base) MCG/ACT inhaler [Pharmacy Med Name: ALBUTEROL HFA (VENTOLIN) INH] 18 g 3    Sig: TAKE 2 PUFFS BY MOUTH EVERY 6 HOURS AS NEEDED FOR WHEEZE OR SHORTNESS OF BREATH     Pulmonology:  Beta Agonists Failed - 07/19/2019  9:47 AM      Failed - One inhaler should last at least one month. If the patient is requesting refills earlier, contact the patient to check for uncontrolled symptoms.      Passed - Valid encounter within last 12 months    Recent Outpatient Visits          2 weeks ago Cough   [facility], NP   4 months ago Right hand pain   [facility], Lilia Argue, MD   6 months ago Type 2 diabetes mellitus with hyperglycemia, without long-term current use of insulin [facility])   Primary Care at [facility], [place] A, MD   7 months ago Acute gout due to renal impairment involving left elbow   [facility], Lilia Argue, MD   7 months ago Needs flu shot   [facility], Arlie Solomons, MD

## 2019-07-30 ENCOUNTER — Telehealth: Payer: Self-pay | Admitting: *Deleted

## 2019-07-30 NOTE — Telephone Encounter (Signed)
Schedule AWV.  

## 2019-07-31 NOTE — Telephone Encounter (Signed)
Pts wife called regarding AWV to sch. Please advise.

## 2019-08-12 ENCOUNTER — Encounter: Payer: Self-pay | Admitting: Registered Nurse

## 2019-08-12 ENCOUNTER — Other Ambulatory Visit: Payer: Self-pay

## 2019-08-12 ENCOUNTER — Ambulatory Visit (INDEPENDENT_AMBULATORY_CARE_PROVIDER_SITE_OTHER): Payer: BC Managed Care – PPO | Admitting: Registered Nurse

## 2019-08-12 VITALS — BP 157/98 | HR 105 | Temp 98.0°F | Resp 18 | Ht 73.0 in | Wt 237.0 lb

## 2019-08-12 DIAGNOSIS — R1084 Generalized abdominal pain: Secondary | ICD-10-CM

## 2019-08-12 MED ORDER — DICYCLOMINE HCL 10 MG PO CAPS: 10.0000 mg | ORAL_CAPSULE | Freq: Two times a day (BID) | ORAL | 0 refills | Status: DC

## 2019-08-12 NOTE — Patient Instructions (Signed)
° ° ° °  If you have lab work done today you will be contacted with your lab results within the next 2 weeks.  If you have not heard from us then please contact us. The fastest way to get your results is to register for My Chart. ° ° °IF you received an x-ray today, you will receive an invoice from [facility]. Please contact  Radiology at [phone] with questions or concerns regarding your invoice.  ° °IF you received labwork today, you will receive an invoice from [facility]. Please contact [facility] at [phone] with questions or concerns regarding your invoice.  ° °Our billing staff will not be able to assist you with questions regarding bills from these companies. ° °You will be contacted with the lab results as soon as they are available. The fastest way to get your results is to activate your My Chart account. Instructions are located on the last page of this paperwork. If you have not heard from us regarding the results in 2 weeks, please contact this office. °  ° ° ° °

## 2019-08-13 LAB — CMP14+EGFR
ALT: 16 IU/L (ref 0–44)
AST: 16 IU/L (ref 0–40)
Albumin/Globulin Ratio: 1.6 (ref 1.2–2.2)
Albumin: 4.4 g/dL (ref 3.7–4.7)
Alkaline Phosphatase: 43 IU/L — ABNORMAL LOW (ref 48–121)
BUN/Creatinine Ratio: 19 (ref 10–24)
BUN: 30 mg/dL — ABNORMAL HIGH (ref 8–27)
Bilirubin Total: 0.3 mg/dL (ref 0.0–1.2)
CO2: 19 mmol/L — ABNORMAL LOW (ref 20–29)
Calcium: 9.3 mg/dL (ref 8.6–10.2)
Chloride: 105 mmol/L (ref 96–106)
Creatinine, Ser: 1.56 mg/dL — ABNORMAL HIGH (ref 0.76–1.27)
GFR calc Af Amer: 51 mL/min/{1.73_m2} — ABNORMAL LOW (ref >59)
GFR calc non Af Amer: 44 mL/min/{1.73_m2} — ABNORMAL LOW (ref >59)
Globulin, Total: 2.7 g/dL (ref 1.5–4.5)
Glucose: 113 mg/dL — ABNORMAL HIGH (ref 65–99)
Potassium: 4.5 mmol/L (ref 3.5–5.2)
Sodium: 139 mmol/L (ref 134–144)
Total Protein: 7.1 g/dL (ref 6.0–8.5)

## 2019-08-13 LAB — CBC
Hematocrit: 44.9 % (ref 37.5–51.0)
Hemoglobin: 15.3 g/dL (ref 13.0–17.7)
MCH: 33.3 pg — ABNORMAL HIGH (ref 26.6–33.0)
MCHC: 34.1 g/dL (ref 31.5–35.7)
MCV: 98 fL — ABNORMAL HIGH (ref 79–97)
Platelets: 207 10*3/uL (ref 150–450)
RBC: 4.59 x10E6/uL (ref 4.14–5.80)
RDW: 12.8 % (ref 11.6–15.4)
WBC: 8 10*3/uL (ref 3.4–10.8)

## 2019-08-13 LAB — LIPASE: Lipase: 120 U/L — ABNORMAL HIGH (ref 13–78)

## 2019-08-13 LAB — AMYLASE: Amylase: 43 U/L (ref 31–110)

## 2019-08-21 ENCOUNTER — Other Ambulatory Visit: Payer: Self-pay | Admitting: Registered Nurse

## 2019-08-21 DIAGNOSIS — R748 Abnormal levels of other serum enzymes: Secondary | ICD-10-CM

## 2019-08-21 NOTE — Progress Notes (Signed)
g

## 2019-08-21 NOTE — Progress Notes (Signed)
If we could call Mr. Walls -  His pancreatic enzymes are elevated. I want him to drink plenty of water, avoid any alcohol, and start a diet of very basic foods: bananas, rice, apple sauce, toast, and other very mild foods. I want to refer him to GI to further investigate this. I am sure this is contributing to his abdominal pain.  Thank you  Kathrin Ruddy, NP

## 2019-08-22 ENCOUNTER — Other Ambulatory Visit: Payer: Self-pay | Admitting: Emergency Medicine

## 2019-08-22 ENCOUNTER — Encounter: Payer: Self-pay | Admitting: Physician Assistant

## 2019-09-23 ENCOUNTER — Other Ambulatory Visit: Payer: Self-pay | Admitting: Registered Nurse

## 2019-09-23 ENCOUNTER — Telehealth: Payer: Self-pay | Admitting: *Deleted

## 2019-09-23 DIAGNOSIS — I10 Essential (primary) hypertension: Secondary | ICD-10-CM

## 2019-09-23 NOTE — Telephone Encounter (Signed)
NO VOICEMAIL  Schedule awv

## 2019-09-28 ENCOUNTER — Other Ambulatory Visit: Payer: Self-pay | Admitting: Registered Nurse

## 2019-09-28 DIAGNOSIS — R059 Cough, unspecified: Secondary | ICD-10-CM

## 2019-10-03 ENCOUNTER — Other Ambulatory Visit (INDEPENDENT_AMBULATORY_CARE_PROVIDER_SITE_OTHER): Payer: BC Managed Care – PPO

## 2019-10-03 ENCOUNTER — Encounter: Payer: Self-pay | Admitting: Physician Assistant

## 2019-10-03 ENCOUNTER — Ambulatory Visit (INDEPENDENT_AMBULATORY_CARE_PROVIDER_SITE_OTHER): Payer: BC Managed Care – PPO | Admitting: Physician Assistant

## 2019-10-03 VITALS — BP 140/80 | HR 76 | Ht 73.0 in | Wt 238.0 lb

## 2019-10-03 DIAGNOSIS — Z86010 Personal history of colonic polyps: Secondary | ICD-10-CM

## 2019-10-03 DIAGNOSIS — R194 Change in bowel habit: Secondary | ICD-10-CM | POA: Diagnosis not present

## 2019-10-03 DIAGNOSIS — R630 Anorexia: Secondary | ICD-10-CM | POA: Diagnosis not present

## 2019-10-03 DIAGNOSIS — R1084 Generalized abdominal pain: Secondary | ICD-10-CM | POA: Diagnosis not present

## 2019-10-03 LAB — BASIC METABOLIC PANEL
BUN: 34 mg/dL — ABNORMAL HIGH (ref 6–23)
CO2: 24 mEq/L (ref 19–32)
Calcium: 9.1 mg/dL (ref 8.4–10.5)
Chloride: 106 mEq/L (ref 96–112)
Creatinine, Ser: 1.67 mg/dL — ABNORMAL HIGH (ref 0.40–1.50)
GFR: 40.64 mL/min — ABNORMAL LOW (ref >60.00)
Glucose, Bld: 103 mg/dL — ABNORMAL HIGH (ref 70–99)
Potassium: 4.5 mEq/L (ref 3.5–5.1)
Sodium: 140 mEq/L (ref 135–145)

## 2019-10-03 LAB — LIPASE: Lipase: 241 U/L — ABNORMAL HIGH (ref 11.0–59.0)

## 2019-10-03 MED ORDER — DICYCLOMINE HCL 10 MG PO CAPS: 10.0000 mg | ORAL_CAPSULE | Freq: Two times a day (BID) | ORAL | 6 refills | Status: DC

## 2019-10-03 MED ORDER — SUTAB 1479-225-188 MG PO TABS: 1.0000 | ORAL_TABLET | ORAL | 0 refills | Status: DC

## 2019-10-03 NOTE — Progress Notes (Signed)
Agree with assessment and plan as outlined.  

## 2019-10-03 NOTE — Progress Notes (Signed)
Subjective:    Patient ID: John Blake, male    DOB: 1945-09-12, 74 y.o.   MRN: [id number]  HPI John Blake is a pleasant 75 year old white male, established with Dr. Havery Moros.  He was last seen in 2018 when he underwent colonoscopy and EGD.  He is referred back today by Maximiano Coss, NP with complaints of abdominal pain, change in bowel habits and recent elevated lipase. Patient has history of hypertension, COPD, adult onset diabetes mellitus, chronic kidney disease, monoclonal gammopathy,.  At EGD in 2018 he had a 3 cm hiatal hernia.  Gastric and small bowel biopsies were unremarkable, negative for H. pylori.  At colonoscopy he had 7 polyps removed all of which were tubular adenomas or sessile serrated polyps and he was indicated for 3-year interval follow-up. Patient says his current symptoms have been intermittent over the past few months, some days much worse than others.  He is describing abdominal cramping and some urgency postprandially.  A lot of these episodes are associated with diarrhea.  He says he had 4 episodes yesterday and that some days he feels "as if he is totally cleaned out".  He is having some normal bowel movements.  Appetite has been a bit off but weight stable.  He says he is usually not hungry at dinnertime.  He has had some vague nausea no vomiting has had some dizziness off and on.  No melena or hematochezia. No recent new medications. Labs from 08/12/2019 lipase 120, amylase within normal limits creatinine 1.56 LFTs within normal limits, CBC normal MCV of 98. He has been on chronic omeprazole 40 mg once or twice daily which he says does control GERD symptoms.  Review of Systems Pertinent positive and negative review of systems were noted in the above HPI section.  All other review of systems was otherwise negative.  Outpatient Encounter Medications as of 10/03/2019  Medication Sig   acetaminophen (TYLENOL) 325 MG tablet Take 325 mg by mouth 2 (two) times daily.    albuterol (VENTOLIN HFA) 108 (90 Base) MCG/ACT inhaler TAKE 2 PUFFS BY MOUTH EVERY 6 HOURS AS NEEDED FOR WHEEZE OR SHORTNESS OF BREATH   allopurinol (ZYLOPRIM) 100 MG tablet Take 1 tablet (100 mg total) by mouth daily.   amLODipine (NORVASC) 5 MG tablet TAKE 1 TABLET BY MOUTH EVERYDAY AT BEDTIME   atorvastatin (LIPITOR) 20 MG tablet TAKE 1 TABLET BY MOUTH EVERY DAY IN THE MORNING   azelastine (ASTELIN) 0.1 % nasal spray PLACE 2 SPRAYS INTO BOTH NOSTRILS 2 (TWO) TIMES DAILY. USE IN EACH NOSTRIL AS DIRECTED   colchicine 0.6 MG tablet Take 3 times a day on day 1 then after that take 2 times a day until 2 days after the gout pain and inflammation stops. Don't take lipitor while taking this medication.   dicyclomine (BENTYL) 10 MG capsule Take 1 capsule (10 mg total) by mouth 2 (two) times daily at 8 am and 10 pm.   glucose blood test strip Use to test blood sugar once daily. Dx: E11.9   Lancets MISC Use for home glucose monitoring   meclizine (ANTIVERT) 12.5 MG tablet Take 1 tablet (12.5 mg total) by mouth 3 (three) times daily as needed for dizziness.   methocarbamol (ROBAXIN) 500 MG tablet Take 1 pill 3 times daily as needed for back spasms.   montelukast (SINGULAIR) 10 MG tablet TAKE 1 TABLET BY MOUTH EVERYDAY AT BEDTIME   omeprazole (PRILOSEC) 40 MG capsule TAKE 1 CAPSULE BY MOUTH TWICE A DAY  ONE TOUCH LANCETS MISC Use for home glucose monitoring   valsartan-hydrochlorothiazide (DIOVAN-HCT) 320-25 MG tablet TAKE 1 TABLET BY MOUTH EVERY DAY   [DISCONTINUED] dicyclomine (BENTYL) 10 MG capsule Take 1 capsule (10 mg total) by mouth 2 (two) times daily before lunch and supper.   Sodium Sulfate-Mag Sulfate-KCl (SUTAB) [phone] MG TABS Take 1 kit by mouth as directed.   No facility-administered encounter medications on file as of 10/03/2019.   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 08/02/2016   COPD (chronic obstructive pulmonary disease) ([place]) 07/14/2016   Smoker 07/14/2016   Chronic seasonal allergic rhinitis 07/14/2016   CKD (chronic kidney disease) stage 3, GFR 30-59 ml/min ([place]) 05/30/2016   Essential hypertension 02/05/2015   Hyperlipidemia 02/05/2015   BMI 34.0-34.9,adult 02/05/2015   Type 2 diabetes mellitus with hyperglycemia, without long-term current use of insulin ([place]) 06/17/2014   Shoulder impingement syndrome 06/17/2014   Social History   Socioeconomic History   Marital status: Married    Spouse name: John Blake   Number of children: 2   Years of education: 12th grade   Highest education level: Not on file  Occupational History   Occupation: retired from trucking    Comment: due to rotator cuff injury   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[employer]  Tobacco Use   Smoking status: Current Every Day Smoker    Packs/day: 0.50    Years: 45.00    Pack years: 22.50    Types: Cigarettes    Start date: 09/10/1969   Smokeless tobacco: Never Used   Tobacco comment: Peak rate of 1.5ppd - Has stopped for 2 years previously  Vaping Use   Vaping Use: Never used  Substance and Sexual Activity   Alcohol use: Yes    Alcohol/week: 14.0 standard drinks    Types: 14 Shots of liquor per week    Comment: 2 DRINKS DAILY   Drug use: No    Comment: remote cocaine   Sexual activity: Not on file  Other Topics Concern   Not on file  Social History Narrative   Lives with wife.   Children are adult, and live independently in [place] and [place].      [facility] (07/14/16):   Originally from [facility] [place] [facility]. He served in [employer] and was in [place] and [place]. 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 Has a dog currently. No bird or mold exposure. No known asbestos exposure.    Social Determinants of Health   Financial Resource Strain:    Difficulty of Paying Living Expenses: Not on file  Food Insecurity:    Worried About Charity fundraiser in the Last Year: Not on file   [employer] of Food in the Last Year: Not on file  Transportation Needs:    Lack of Transportation (Medical): Not on file   Lack of Transportation (Non-Medical): Not on file  Physical Activity:    Days of Exercise per Week: Not on file   Minutes of Exercise per Session: Not on file  Stress:  Feeling of Stress : Not on file  Social Connections:    Frequency of Communication with Friends and Family: Not on file   Frequency of Social Gatherings with Friends and Family: Not on file   Attends Religious Services: Not on file   Active Member of Clubs or Organizations: Not on file   Attends Archivist Meetings: Not on file   Marital Status: Not on file  Intimate Partner Violence:    Fear of Current or Ex-Partner: Not on file   Emotionally Abused: Not on file   Physically Abused: Not on file   Sexually Abused: Not on file    Mr. Pickup family history includes Diabetes in his mother.      Objective:    Vitals:   10/03/19 0908  BP: 140/80  Pulse: 76    Physical Exam Well-developed well-nourished older white male in no acute distress.  Height, Weight, 238 BMI 31.4  HEENT; nontraumatic normocephalic, EOMI, PERR LA, sclera anicteric. Oropharynx; not examined Neck; supple, no JVD Cardiovascular; regular rate and rhythm with S1-S2, no murmur rub or gallop Pulmonary; Clear bilaterally Abdomen; soft, obese , nondistended, he is tender across the upper abdomen and in the epigastrium, no rebound,, no palpable mass or hepatosplenomegaly, bowel sounds are active Rectal; not done  today Skin; benign exam, no jaundice rash or appreciable lesions Extremities; no clubbing cyanosis or edema skin warm and dry Neuro/Psych; alert and oriented x4, grossly nonfocal mood and affect appropriate       Assessment & Plan:   #57  74 year old white male with 59-month history of upper abdominal pain, cramping, postprandial urgency and frequent associated diarrhea.  He has had a decrease in appetite but no weight loss occasional vague nausea. Patient is quite tender across the upper abdomen on exam. Etiology of current symptoms is not clear. #2 history of multiple tubular adenomas and sessile serrated polyps-due for follow-up colonoscopy #3 GERD stable 4.  Hypertension 5.  COPD 6.  Adult onset diabetes mellitus 7.  History of monoclonal gammopathy  Plan continue omeprazole 40 mg daily to twice daily. Start trial of dicyclomine 10 mg p.o. twice daily to 3 times daily.  AC. Schedule for CT of the abdomen and pelvis with contrast. Patient will also be scheduled for colonoscopy with Dr. Havery Moros.  Procedure was discussed in detail with patient including indications risks and benefits and he is agreeable to proceed. Repeat lipase and check be met today Further plans pending results of above.    Karolynn Infantino Genia Harold PA-C 10/03/2019   Cc: Maximiano Coss, NP

## 2019-10-03 NOTE — Patient Instructions (Signed)
If you are age 72 or older, your body mass index should be between 23-30. Your Body mass index is 31.4 kg/m. If this is out of the aforementioned range listed, please consider follow up with your Primary Care Provider.  If you are age 71 or younger, your body mass index should be between 19-25. Your Body mass index is 31.4 kg/m. If this is out of the aformentioned range listed, please consider follow up with your Primary Care Provider.   Your provider has requested that you go to the basement level for lab work before leaving today. Press "B" on the elevator. The lab is located at the first door on the left as you exit the elevator.  You have been scheduled for a CT scan of the abdomen and pelvis at [facility], 1st floor Radiology. You are scheduled on 10/10/19 at 3:00 pm. You should arrive 15 minutes prior to your appointment time for registration.  Please pick up 2 bottles of contrast from [facility] at least 3 days prior to your scan. The solution may taste better if refrigerated, but do NOT add ice or any other liquid to this solution. Shake well before drinking.   Please follow the written instructions below on the day of your exam:   1) Do not eat anything after 11:00 am (4 hours prior to your test)   2) Drink 1 bottle of contrast @ 1:00 pm (2 hours prior to your exam)  Remember to shake well before drinking and do NOT pour over ice.     Drink 1 bottle of contrast @ 2:00 pm (1 hour prior to your exam)   You may take any medications as prescribed with a small amount of water, if necessary. If you take any of the following medications: METFORMIN, GLUCOPHAGE, GLUCOVANCE, AVANDAMET, RIOMET, FORTAMET, [place] MET, JANUMET, GLUMETZA or METAGLIP, you MAY be asked to HOLD this medication 48 hours AFTER the exam.   The purpose of you drinking the oral contrast is to aid in the visualization of your intestinal tract. The contrast solution may cause some diarrhea. Depending on your  individual set of symptoms, you may also receive an intravenous injection of x-ray contrast/dye. Plan on being at [facility] for 45 minutes or longer, depending on the type of exam you are having performed.   If you have any questions regarding your exam or if you need to reschedule, you may call [facility] at [phone] between the hours of 8:00 am and 5:00 pm, Monday-Friday.   Continue Omeprazole 40 mg 1 capsule every morning. START Dicyclomine 10 mg 1 capsule in the morning and 1 capsule in the evening.  Follow up pending the results of your CT, labs, and Colonoscopy.

## 2019-10-10 ENCOUNTER — Ambulatory Visit (HOSPITAL_COMMUNITY)
Admission: RE | Admit: 2019-10-10 | Discharge: 2019-10-10 | Disposition: A | Discharge status: Home / Self Care | Payer: BC Managed Care – PPO | Source: Ambulatory Visit | Attending: Physician Assistant | Admitting: Physician Assistant

## 2019-10-10 ENCOUNTER — Other Ambulatory Visit: Payer: Self-pay

## 2019-10-10 DIAGNOSIS — R194 Change in bowel habit: Secondary | ICD-10-CM | POA: Diagnosis not present

## 2019-10-10 DIAGNOSIS — R630 Anorexia: Secondary | ICD-10-CM | POA: Diagnosis not present

## 2019-10-10 DIAGNOSIS — R109 Unspecified abdominal pain: Secondary | ICD-10-CM | POA: Diagnosis not present

## 2019-10-10 DIAGNOSIS — R1084 Generalized abdominal pain: Secondary | ICD-10-CM | POA: Insufficient documentation

## 2019-10-10 DIAGNOSIS — Z86010 Personal history of colonic polyps: Secondary | ICD-10-CM | POA: Insufficient documentation

## 2019-10-10 MED ORDER — IOPAMIDOL (ISOVUE-300) INJECTION 61%
75.0000 mL | Freq: Once | INTRAVENOUS | Status: AC | PRN
  Administered 2019-10-10: 75 mL via INTRAVENOUS

## 2019-10-14 ENCOUNTER — Encounter: Payer: Self-pay | Admitting: Registered Nurse
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
## 2019-10-15 ENCOUNTER — Other Ambulatory Visit: Payer: Self-pay

## 2019-10-15 DIAGNOSIS — R918 Other nonspecific abnormal finding of lung field: Secondary | ICD-10-CM

## 2019-10-15 DIAGNOSIS — J8410 Pulmonary fibrosis, unspecified: Secondary | ICD-10-CM

## 2019-10-16 ENCOUNTER — Other Ambulatory Visit: Payer: Self-pay

## 2019-10-16 ENCOUNTER — Encounter: Payer: BC Managed Care – PPO | Admitting: Gastroenterology

## 2019-10-16 ENCOUNTER — Telehealth: Payer: Self-pay | Admitting: Physician Assistant

## 2019-10-16 DIAGNOSIS — R918 Other nonspecific abnormal finding of lung field: Secondary | ICD-10-CM

## 2019-10-16 DIAGNOSIS — J441 Chronic obstructive pulmonary disease with (acute) exacerbation: Secondary | ICD-10-CM

## 2019-10-16 DIAGNOSIS — J8410 Pulmonary fibrosis, unspecified: Secondary | ICD-10-CM

## 2019-10-16 NOTE — Telephone Encounter (Signed)
See result note.  

## 2019-10-17 ENCOUNTER — Other Ambulatory Visit: Payer: Self-pay

## 2019-10-17 ENCOUNTER — Ambulatory Visit (AMBULATORY_SURGERY_CENTER): Payer: BC Managed Care – PPO | Admitting: Gastroenterology

## 2019-10-17 ENCOUNTER — Encounter: Payer: Self-pay | Admitting: Gastroenterology

## 2019-10-17 VITALS — BP 109/54 | HR 65 | Temp 97.2°F | Resp 21 | Ht 73.0 in | Wt 238.0 lb

## 2019-10-17 DIAGNOSIS — R1084 Generalized abdominal pain: Secondary | ICD-10-CM

## 2019-10-17 DIAGNOSIS — R194 Change in bowel habit: Secondary | ICD-10-CM

## 2019-10-17 DIAGNOSIS — K635 Polyp of colon: Secondary | ICD-10-CM | POA: Diagnosis not present

## 2019-10-17 DIAGNOSIS — D127 Benign neoplasm of rectosigmoid junction: Secondary | ICD-10-CM

## 2019-10-17 DIAGNOSIS — Z86010 Personal history of colonic polyps: Secondary | ICD-10-CM | POA: Diagnosis not present

## 2019-10-17 DIAGNOSIS — K649 Unspecified hemorrhoids: Secondary | ICD-10-CM | POA: Diagnosis not present

## 2019-10-17 DIAGNOSIS — Z860101 Personal history of adenomatous and serrated colon polyps: Secondary | ICD-10-CM

## 2019-10-17 MED ORDER — SODIUM CHLORIDE 0.9 % IV SOLN: 500.0000 mL | Freq: Once | INTRAVENOUS | Status: DC

## 2019-10-17 NOTE — Progress Notes (Signed)
pt tolerated well. VSS. awake and to recovery. Report given to RN.  

## 2019-10-17 NOTE — Progress Notes (Signed)
Called to room to assist during endoscopic procedure.  Patient ID and intended procedure confirmed with present staff. Received instructions for my participation in the procedure from the performing physician.  

## 2019-10-17 NOTE — Op Note (Signed)
[name] Procedure Date: 10/17/2019 3:31 PM MRN: [id number] Endoscopist: Remo Lipps P. Havery Moros , MD Age: 74 Referring MD:  Date of Birth: 06-Aug-1945 Gender: Male Account #: [id number] Procedure:                Colonoscopy Indications:              High risk colon cancer surveillance: Personal                            history of colonic polyps - 7 adenomas removed in                            2018, ongoing abdominal pain, negative CT scan,                            urgent loose stools Medicines:                Monitored Anesthesia Care Procedure:                Pre-Anesthesia Assessment:                           - Prior to the procedure, a History and Physical                            was performed, and patient medications and                            allergies were reviewed. The patient's tolerance of                            previous anesthesia was also reviewed. The risks                            and benefits of the procedure and the sedation                            options and risks were discussed with the patient.                            All questions were answered, and informed consent                            was obtained. Prior Anticoagulants: The patient has                            taken no previous anticoagulant or antiplatelet                            agents. ASA Grade Assessment: III - A patient with                            severe systemic disease. After reviewing the risks  and benefits, the patient was deemed in                            satisfactory condition to undergo the procedure.                           After obtaining informed consent, the colonoscope                            was passed under direct vision. Throughout the                            procedure, the patient's blood pressure, pulse, and                            oxygen saturations were monitored  continuously. The                            Colonoscope was introduced through the anus and                            advanced to the the terminal ileum, with                            identification of the appendiceal orifice and IC                            valve. The colonoscopy was performed without                            difficulty. The patient tolerated the procedure                            well. The quality of the bowel preparation was                            good. The terminal ileum, ileocecal valve,                            appendiceal orifice, and rectum were photographed. Scope In: 3:46:59 PM Scope Out: 4:01:57 PM Scope Withdrawal Time: 0 hours 13 minutes 38 seconds  Total Procedure Duration: 0 hours 14 minutes 58 seconds  Findings:                 Hemorrhoids were found on perianal exam.                           The terminal ileum appeared normal.                           Four sessile polyps were found in the recto-sigmoid                            colon. The polyps were 3 to 4 mm in size. These  polyps were removed with a cold snare. Resection                            and retrieval were complete.                           Internal hemorrhoids were found during retroflexion.                           The exam was otherwise without abnormality.                           Biopsies for histology were taken with a cold                            forceps from the right colon and left colon for                            evaluation of microscopic colitis. Complications:            No immediate complications. Estimated blood loss:                            Minimal. Estimated Blood Loss:     Estimated blood loss was minimal. Impression:               - Hemorrhoids found on perianal exam.                           - The examined portion of the ileum was normal.                           - Four 3 to 4 mm polyps at the recto-sigmoid colon,                             removed with a cold snare. Resected and retrieved.                           - Internal hemorrhoids.                           - The examination was otherwise normal.                           - Biopsies were taken with a cold forceps from the                            right colon and left colon for evaluation of                            microscopic colitis. Recommendation:           - Patient has a contact number available for                            emergencies. The  signs and symptoms of potential                            delayed complications were discussed with the                            patient. Return to normal activities tomorrow.                            Written discharge instructions were provided to the                            patient.                           - Resume previous diet.                           - Continue present medications.                           - Await pathology results with further                            recommendations Remo Lipps P. Naylah Cork, MD 10/17/2019 4:06:32 PM This report has been signed electronically.

## 2019-10-17 NOTE — Patient Instructions (Signed)
Handout on polyps given. ° °YOU HAD AN ENDOSCOPIC PROCEDURE TODAY AT THE [facility]:   Refer to the procedure report that was given to you for any specific questions about what was found during the examination.  If the procedure report does not answer your questions, please call your gastroenterologist to clarify.  If you requested that your care partner not be given the details of your procedure findings, then the procedure report has been included in a sealed envelope for you to review at your convenience later. ° °YOU SHOULD EXPECT: Some feelings of bloating in the abdomen. Passage of more gas than usual.  Walking can help get rid of the air that was put into your GI tract during the procedure and reduce the bloating. If you had a lower endoscopy (such as a colonoscopy or flexible sigmoidoscopy) you may notice spotting of blood in your stool or on the toilet paper. If you underwent a bowel prep for your procedure, you may not have a normal bowel movement for a few days. ° °Please Note:  You might notice some irritation and congestion in your nose or some drainage.  This is from the oxygen used during your procedure.  There is no need for concern and it should clear up in a day or so. ° °SYMPTOMS TO REPORT IMMEDIATELY: ° °Following lower endoscopy (colonoscopy or flexible sigmoidoscopy): ° Excessive amounts of blood in the stool ° Significant tenderness or worsening of abdominal pains ° Swelling of the abdomen that is new, acute ° Fever of 100°F or higher ° °For urgent or emergent issues, a gastroenterologist can be reached at any hour by calling [phone]. °Do not use MyChart messaging for urgent concerns.  ° ° °DIET:  We do recommend a small meal at first, but then you may proceed to your regular diet.  Drink plenty of fluids but you should avoid alcoholic beverages for 24 hours. ° °ACTIVITY:  You should plan to take it easy for the rest of today and you should NOT DRIVE or use heavy machinery  until tomorrow (because of the sedation medicines used during the test).   ° °FOLLOW UP: °Our staff will call the number listed on your records 48-72 hours following your procedure to check on you and address any questions or concerns that you may have regarding the information given to you following your procedure. If we do not reach you, we will leave a message.  We will attempt to reach you two times.  During this call, we will ask if you have developed any symptoms of COVID 19. If you develop any symptoms (ie: fever, flu-like symptoms, shortness of breath, cough etc.) before then, please call [phone].  If you test positive for Covid 19 in the 2 weeks post procedure, please call and report this information to us.   ° °If any biopsies were taken you will be contacted by phone or by letter within the next 1-3 weeks.  Please call us at [phone] if you have not heard about the biopsies in 3 weeks.  ° ° °SIGNATURES/CONFIDENTIALITY: °You and/or your care partner have signed paperwork which will be entered into your electronic medical record.  These signatures attest to the fact that that the information above on your After Visit Summary has been reviewed and is understood.  Full responsibility of the confidentiality of this discharge information lies with you and/or your care-partner.  °

## 2019-10-21 ENCOUNTER — Telehealth: Payer: Self-pay

## 2019-10-21 NOTE — Telephone Encounter (Signed)
First post procedure follow up call, no answer 

## 2019-10-21 NOTE — Telephone Encounter (Signed)
Second post procedure follow up call, no answer 

## 2019-10-28 ENCOUNTER — Other Ambulatory Visit: Payer: Self-pay | Admitting: Registered Nurse

## 2019-10-28 DIAGNOSIS — R059 Cough, unspecified: Secondary | ICD-10-CM

## 2019-12-05 ENCOUNTER — Encounter: Payer: Self-pay | Admitting: Registered Nurse

## 2019-12-05 ENCOUNTER — Other Ambulatory Visit: Payer: Self-pay

## 2019-12-05 ENCOUNTER — Ambulatory Visit (INDEPENDENT_AMBULATORY_CARE_PROVIDER_SITE_OTHER): Payer: Private Health Insurance - Indemnity | Admitting: Registered Nurse

## 2019-12-05 VITALS — BP 163/92 | HR 77 | Temp 98.2°F | Resp 18 | Ht 73.0 in | Wt 239.4 lb

## 2019-12-05 DIAGNOSIS — Z23 Encounter for immunization: Secondary | ICD-10-CM | POA: Diagnosis not present

## 2019-12-05 DIAGNOSIS — M7989 Other specified soft tissue disorders: Secondary | ICD-10-CM | POA: Diagnosis not present

## 2019-12-05 DIAGNOSIS — E1165 Type 2 diabetes mellitus with hyperglycemia: Secondary | ICD-10-CM

## 2019-12-05 LAB — POCT GLYCOSYLATED HEMOGLOBIN (HGB A1C): Hemoglobin A1C: 6 % — AB (ref 4.0–5.6)

## 2019-12-05 MED ORDER — FREESTYLE SYSTEM KIT: 1.0000 | PACK | 0 refills | Status: DC | PRN
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
## 2019-12-05 NOTE — Patient Instructions (Signed)
° ° ° °  If you have lab work done today you will be contacted with your lab results within the next 2 weeks.  If you have not heard from us then please contact us. The fastest way to get your results is to register for My Chart. ° ° °IF you received an x-ray today, you will receive an invoice from [facility]. Please contact [facility] at [phone] with questions or concerns regarding your invoice.  ° °IF you received labwork today, you will receive an invoice from [facility]. Please contact [facility] at [phone] with questions or concerns regarding your invoice.  ° °Our billing staff will not be able to assist you with questions regarding bills from these companies. ° °You will be contacted with the lab results as soon as they are available. The fastest way to get your results is to activate your My Chart account. Instructions are located on the last page of this paperwork. If you have not heard from us regarding the results in 2 weeks, please contact this office. °  ° ° ° °

## 2019-12-15 ENCOUNTER — Other Ambulatory Visit: Payer: Self-pay | Admitting: Registered Nurse

## 2019-12-15 DIAGNOSIS — I10 Essential (primary) hypertension: Secondary | ICD-10-CM

## 2019-12-22 ENCOUNTER — Telehealth: Payer: Self-pay | Admitting: Registered Nurse

## 2019-12-22 NOTE — Telephone Encounter (Signed)
What is the name of the medication? valsartan-hydrochlorothiazide (DIOVAN-HCT) 320-25 MG tablet [[id number]]    Have you contacted your pharmacy to request a refill? Pt has paid $139 for a month of this script. It is cheaper at [facility] on [address] This will be for next month-January.    Which pharmacy would you like this sent to? [facility] on [address]  Patient notified that their request is being sent to the clinical staff for review and that they should receive a call once it is complete. If they do not receive a call within 72 hours they can check with their pharmacy or our office.

## 2019-12-23 ENCOUNTER — Other Ambulatory Visit: Payer: Self-pay

## 2019-12-23 DIAGNOSIS — I10 Essential (primary) hypertension: Secondary | ICD-10-CM

## 2019-12-23 MED ORDER — VALSARTAN-HYDROCHLOROTHIAZIDE 320-25 MG PO TABS: 1.0000 | ORAL_TABLET | Freq: Every day | ORAL | 2 refills | Status: DC

## 2019-12-23 NOTE — Telephone Encounter (Signed)
Pts wife called and stated her husband is wanting a paper Rx so he can take it where he wants it refilled. Pt would like a call when he can come pick it up. [phone]. For medication listed above. Please advise.

## 2019-12-23 NOTE — Telephone Encounter (Signed)
Pt informed prescription for diovan-hct will be printed and placed at front desk for pick up .

## 2019-12-26 ENCOUNTER — Telehealth: Payer: Self-pay | Admitting: Registered Nurse

## 2019-12-26 ENCOUNTER — Ambulatory Visit: Payer: BC Managed Care – PPO | Admitting: Orthopaedic Surgery

## 2019-12-26 NOTE — Telephone Encounter (Signed)
Pt came in to get paper Rx and stated he wanted documented that he will need paper Rx from now on because he changed pharmacy. Please advise.

## 2019-12-26 NOTE — Telephone Encounter (Signed)
Noted fyi to provider 

## 2020-01-30 ENCOUNTER — Ambulatory Visit (INDEPENDENT_AMBULATORY_CARE_PROVIDER_SITE_OTHER): Payer: Medicare Other | Admitting: Orthopaedic Surgery

## 2020-01-30 ENCOUNTER — Encounter: Payer: Self-pay | Admitting: Orthopaedic Surgery

## 2020-01-30 DIAGNOSIS — M1A0410 Idiopathic chronic gout, right hand, without tophus (tophi): Secondary | ICD-10-CM | POA: Insufficient documentation

## 2020-01-30 MED ORDER — COLCHICINE 0.6 MG PO TABS: 0.6000 mg | ORAL_TABLET | Freq: Two times a day (BID) | ORAL | 0 refills | Status: DC | PRN

## 2020-01-30 MED ORDER — PREDNISONE 10 MG (21) PO TBPK: ORAL_TABLET | ORAL | 0 refills | Status: DC
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
## 2020-01-31 LAB — URIC ACID: Uric Acid, Serum: 11.3 mg/dL — ABNORMAL HIGH (ref 4.0–8.0)

## 2020-01-31 MED ORDER — FEBUXOSTAT 40 MG PO TABS: 40.0000 mg | ORAL_TABLET | Freq: Every day | ORAL | 0 refills | Status: DC

## 2020-01-31 NOTE — Addendum Note (Signed)
Addended by: Azucena Cecil on: 01/31/2020 08:17 AM   Modules accepted: Orders

## 2020-01-31 NOTE — Progress Notes (Signed)
Uric acid is very high.  Please let him know that it's gout that's causing his hands to feel that way.  I sent ulroic to his pharmacy.  He should not take uloric and allopurinol together.

## 2020-02-06 ENCOUNTER — Other Ambulatory Visit: Payer: Self-pay

## 2020-02-06 ENCOUNTER — Telehealth: Payer: Self-pay

## 2020-02-06 MED ORDER — FEBUXOSTAT 40 MG PO TABS
40.0000 mg | ORAL_TABLET | Freq: Every day | ORAL | 0 refills | Status: DC
Start: 2020-02-06 — End: 2020-04-28

## 2020-02-06 NOTE — Telephone Encounter (Signed)
John Koyanagi, MD  01/31/2020 8:17 AM EST      Uric acid is very high. Please let him know that it's gout that's causing his hands to feel that way. I sent ulroic to his pharmacy. He should not take uloric and allopurinol together.       **Tried calling patient no answer LMOM.  Just need to advise on msg

## 2020-02-20 DIAGNOSIS — Z23 Encounter for immunization: Secondary | ICD-10-CM | POA: Diagnosis not present

## 2020-03-01 ENCOUNTER — Telehealth: Payer: Self-pay | Admitting: Registered Nurse

## 2020-03-01 NOTE — Telephone Encounter (Signed)
Pts wife calling again and is requesting to have this sent to new pharmacy instead. She also states that the pt is complaining of extreme pain. Please advise.       [id number] [address], [place] [address]  Phone: [phone] Fax: [phone]  Hours: Not open 24 hours

## 2020-03-01 NOTE — Telephone Encounter (Signed)
Pt requesting refill not filled since 2020 please advise

## 2020-03-01 NOTE — Telephone Encounter (Signed)
Medication Refill - Medication:   colchicine 0.6 MG tablet   Preferred Pharmacy (with phone number or street name):  [facility] [place] Phone:  [phone]  Fax:  [phone]       Agent: Please be advised that RX refills may take up to 3 business days. We ask that you follow-up with your pharmacy.

## 2020-03-02 ENCOUNTER — Other Ambulatory Visit: Payer: Self-pay | Admitting: Emergency Medicine

## 2020-03-02 DIAGNOSIS — M109 Gout, unspecified: Secondary | ICD-10-CM

## 2020-03-02 DIAGNOSIS — Z23 Encounter for immunization: Secondary | ICD-10-CM

## 2020-03-02 MED ORDER — COLCHICINE 0.6 MG PO TABS: 0.6000 mg | ORAL_TABLET | Freq: Two times a day (BID) | ORAL | 0 refills | Status: DC | PRN

## 2020-03-02 NOTE — Telephone Encounter (Signed)
Pt has requested to have his colchicine rerouted to the correct pharmacy. Please send to pharmacy listed below  [id number] [address], [place]  [place] [address]  Phone: [phone] Fax: [phone]

## 2020-03-02 NOTE — Telephone Encounter (Signed)
Rx resent to [facility]

## 2020-03-02 NOTE — Telephone Encounter (Signed)
Can you send in one more, but needs to f/u with pcp for further management as they will likely want to follow labs

## 2020-03-08 ENCOUNTER — Encounter: Payer: Self-pay | Admitting: Registered Nurse

## 2020-03-08 ENCOUNTER — Ambulatory Visit (INDEPENDENT_AMBULATORY_CARE_PROVIDER_SITE_OTHER): Payer: Medicare Other | Admitting: Registered Nurse

## 2020-03-08 ENCOUNTER — Other Ambulatory Visit: Payer: Self-pay

## 2020-03-08 VITALS — BP 179/95 | HR 92 | Temp 98.2°F | Resp 18 | Ht 73.0 in | Wt 231.2 lb

## 2020-03-08 DIAGNOSIS — M10372 Gout due to renal impairment, left ankle and foot: Secondary | ICD-10-CM

## 2020-03-08 MED ORDER — PREDNISONE 10 MG (21) PO TBPK: ORAL_TABLET | ORAL | 0 refills | Status: DC

## 2020-03-08 MED ORDER — TRAMADOL HCL 50 MG PO TABS: 50.0000 mg | ORAL_TABLET | Freq: Three times a day (TID) | ORAL | 0 refills | Status: AC | PRN

## 2020-03-08 NOTE — Patient Instructions (Signed)
° ° ° °  If you have lab work done today you will be contacted with your lab results within the next 2 weeks.  If you have not heard from us then please contact us. The fastest way to get your results is to register for My Chart. ° ° °IF you received an x-ray today, you will receive an invoice from [facility]. Please contact [facility] at [phone] with questions or concerns regarding your invoice.  ° °IF you received labwork today, you will receive an invoice from [facility]. Please contact [facility] at [phone] with questions or concerns regarding your invoice.  ° °Our billing staff will not be able to assist you with questions regarding bills from these companies. ° °You will be contacted with the lab results as soon as they are available. The fastest way to get your results is to activate your My Chart account. Instructions are located on the last page of this paperwork. If you have not heard from us regarding the results in 2 weeks, please contact this office. °  ° ° ° °

## 2020-03-31 ENCOUNTER — Other Ambulatory Visit: Payer: Self-pay | Admitting: Registered Nurse

## 2020-03-31 DIAGNOSIS — M109 Gout, unspecified: Secondary | ICD-10-CM

## 2020-04-26 ENCOUNTER — Other Ambulatory Visit: Payer: Self-pay | Admitting: Registered Nurse

## 2020-04-26 DIAGNOSIS — M109 Gout, unspecified: Secondary | ICD-10-CM

## 2020-04-28 ENCOUNTER — Emergency Department (HOSPITAL_COMMUNITY): Payer: Managed Care, Other (non HMO)

## 2020-04-28 ENCOUNTER — Inpatient Hospital Stay (HOSPITAL_COMMUNITY)
Admission: EM | Admit: 2020-04-28 | Discharge: 2020-05-02 | DRG: 683 | Disposition: A | Discharge status: Home Health | Payer: Managed Care, Other (non HMO) | Attending: Internal Medicine | Admitting: Internal Medicine

## 2020-04-28 ENCOUNTER — Other Ambulatory Visit: Payer: Self-pay

## 2020-04-28 DIAGNOSIS — E782 Mixed hyperlipidemia: Secondary | ICD-10-CM

## 2020-04-28 DIAGNOSIS — N185 Chronic kidney disease, stage 5: Secondary | ICD-10-CM | POA: Diagnosis not present

## 2020-04-28 DIAGNOSIS — R112 Nausea with vomiting, unspecified: Secondary | ICD-10-CM | POA: Diagnosis not present

## 2020-04-28 DIAGNOSIS — K862 Cyst of pancreas: Secondary | ICD-10-CM | POA: Diagnosis not present

## 2020-04-28 DIAGNOSIS — E871 Hypo-osmolality and hyponatremia: Secondary | ICD-10-CM | POA: Diagnosis present

## 2020-04-28 DIAGNOSIS — Z20822 Contact with and (suspected) exposure to covid-19: Secondary | ICD-10-CM | POA: Diagnosis not present

## 2020-04-28 DIAGNOSIS — J410 Simple chronic bronchitis: Secondary | ICD-10-CM | POA: Diagnosis not present

## 2020-04-28 DIAGNOSIS — Z86010 Personal history of colonic polyps: Secondary | ICD-10-CM

## 2020-04-28 DIAGNOSIS — N281 Cyst of kidney, acquired: Secondary | ICD-10-CM | POA: Diagnosis not present

## 2020-04-28 DIAGNOSIS — Z833 Family history of diabetes mellitus: Secondary | ICD-10-CM

## 2020-04-28 DIAGNOSIS — M1A9XX0 Chronic gout, unspecified, without tophus (tophi): Secondary | ICD-10-CM | POA: Diagnosis present

## 2020-04-28 DIAGNOSIS — E785 Hyperlipidemia, unspecified: Secondary | ICD-10-CM | POA: Diagnosis present

## 2020-04-28 DIAGNOSIS — E1129 Type 2 diabetes mellitus with other diabetic kidney complication: Secondary | ICD-10-CM | POA: Diagnosis not present

## 2020-04-28 DIAGNOSIS — E860 Dehydration: Secondary | ICD-10-CM | POA: Diagnosis not present

## 2020-04-28 DIAGNOSIS — R197 Diarrhea, unspecified: Secondary | ICD-10-CM | POA: Diagnosis not present

## 2020-04-28 DIAGNOSIS — F17210 Nicotine dependence, cigarettes, uncomplicated: Secondary | ICD-10-CM | POA: Diagnosis present

## 2020-04-28 DIAGNOSIS — E872 Acidosis, unspecified: Secondary | ICD-10-CM | POA: Diagnosis present

## 2020-04-28 DIAGNOSIS — N1832 Chronic kidney disease, stage 3b: Secondary | ICD-10-CM | POA: Diagnosis present

## 2020-04-28 DIAGNOSIS — E875 Hyperkalemia: Secondary | ICD-10-CM | POA: Diagnosis not present

## 2020-04-28 DIAGNOSIS — E861 Hypovolemia: Secondary | ICD-10-CM | POA: Diagnosis present

## 2020-04-28 DIAGNOSIS — I700 Atherosclerosis of aorta: Secondary | ICD-10-CM | POA: Diagnosis not present

## 2020-04-28 DIAGNOSIS — D472 Monoclonal gammopathy: Secondary | ICD-10-CM | POA: Diagnosis not present

## 2020-04-28 DIAGNOSIS — Y929 Unspecified place or not applicable: Secondary | ICD-10-CM

## 2020-04-28 DIAGNOSIS — E1165 Type 2 diabetes mellitus with hyperglycemia: Secondary | ICD-10-CM | POA: Diagnosis not present

## 2020-04-28 DIAGNOSIS — I129 Hypertensive chronic kidney disease with stage 1 through stage 4 chronic kidney disease, or unspecified chronic kidney disease: Secondary | ICD-10-CM | POA: Diagnosis present

## 2020-04-28 DIAGNOSIS — K4020 Bilateral inguinal hernia, without obstruction or gangrene, not specified as recurrent: Secondary | ICD-10-CM | POA: Diagnosis not present

## 2020-04-28 DIAGNOSIS — Z79899 Other long term (current) drug therapy: Secondary | ICD-10-CM

## 2020-04-28 DIAGNOSIS — R630 Anorexia: Secondary | ICD-10-CM | POA: Diagnosis present

## 2020-04-28 DIAGNOSIS — J449 Chronic obstructive pulmonary disease, unspecified: Secondary | ICD-10-CM | POA: Diagnosis present

## 2020-04-28 DIAGNOSIS — I959 Hypotension, unspecified: Secondary | ICD-10-CM | POA: Diagnosis present

## 2020-04-28 DIAGNOSIS — I120 Hypertensive chronic kidney disease with stage 5 chronic kidney disease or end stage renal disease: Secondary | ICD-10-CM | POA: Diagnosis not present

## 2020-04-28 DIAGNOSIS — D631 Anemia in chronic kidney disease: Secondary | ICD-10-CM | POA: Diagnosis not present

## 2020-04-28 DIAGNOSIS — E119 Type 2 diabetes mellitus without complications: Secondary | ICD-10-CM | POA: Diagnosis present

## 2020-04-28 DIAGNOSIS — Z87828 Personal history of other (healed) physical injury and trauma: Secondary | ICD-10-CM

## 2020-04-28 DIAGNOSIS — T504X5A Adverse effect of drugs affecting uric acid metabolism, initial encounter: Secondary | ICD-10-CM | POA: Diagnosis present

## 2020-04-28 DIAGNOSIS — E1122 Type 2 diabetes mellitus with diabetic chronic kidney disease: Secondary | ICD-10-CM | POA: Diagnosis present

## 2020-04-28 DIAGNOSIS — N179 Acute kidney failure, unspecified: Principal | ICD-10-CM | POA: Diagnosis present

## 2020-04-28 DIAGNOSIS — I10 Essential (primary) hypertension: Secondary | ICD-10-CM | POA: Diagnosis present

## 2020-04-28 DIAGNOSIS — N183 Chronic kidney disease, stage 3 unspecified: Secondary | ICD-10-CM | POA: Diagnosis present

## 2020-04-28 LAB — COMPREHENSIVE METABOLIC PANEL
ALT: 10 U/L (ref 0–44)
AST: 8 U/L — ABNORMAL LOW (ref 15–41)
Albumin: 3.6 g/dL (ref 3.5–5.0)
Alkaline Phosphatase: 42 U/L (ref 38–126)
Anion gap: 20 — ABNORMAL HIGH (ref 5–15)
BUN: 203 mg/dL — ABNORMAL HIGH (ref 8–23)
CO2: 9 mmol/L — ABNORMAL LOW (ref 22–32)
Calcium: 8.8 mg/dL — ABNORMAL LOW (ref 8.9–10.3)
Chloride: 102 mmol/L (ref 98–111)
Creatinine, Ser: 9.19 mg/dL — ABNORMAL HIGH (ref 0.61–1.24)
GFR, Estimated: 6 mL/min — ABNORMAL LOW (ref >60)
Glucose, Bld: 115 mg/dL — ABNORMAL HIGH (ref 70–99)
Potassium: 5.9 mmol/L — ABNORMAL HIGH (ref 3.5–5.1)
Sodium: 131 mmol/L — ABNORMAL LOW (ref 135–145)
Total Bilirubin: 0.6 mg/dL (ref 0.3–1.2)
Total Protein: 6.6 g/dL (ref 6.5–8.1)

## 2020-04-28 LAB — URINALYSIS, ROUTINE W REFLEX MICROSCOPIC
Bilirubin Urine: NEGATIVE
Glucose, UA: NEGATIVE mg/dL
Ketones, ur: NEGATIVE mg/dL
Leukocytes,Ua: NEGATIVE
Nitrite: NEGATIVE
Protein, ur: NEGATIVE mg/dL
Specific Gravity, Urine: 1.013 (ref 1.005–1.030)
pH: 5 (ref 5.0–8.0)

## 2020-04-28 LAB — CBC WITH DIFFERENTIAL/PLATELET
Abs Immature Granulocytes: 0.02 10*3/uL (ref 0.00–0.07)
Basophils Absolute: 0 10*3/uL (ref 0.0–0.1)
Basophils Relative: 0 %
Eosinophils Absolute: 0.3 10*3/uL (ref 0.0–0.5)
Eosinophils Relative: 4 %
HCT: 40.8 % (ref 39.0–52.0)
Hemoglobin: 13.6 g/dL (ref 13.0–17.0)
Immature Granulocytes: 0 %
Lymphocytes Relative: 18 %
Lymphs Abs: 1.4 10*3/uL (ref 0.7–4.0)
MCH: 32.9 pg (ref 26.0–34.0)
MCHC: 33.3 g/dL (ref 30.0–36.0)
MCV: 98.6 fL (ref 80.0–100.0)
Monocytes Absolute: 0.6 10*3/uL (ref 0.1–1.0)
Monocytes Relative: 8 %
Neutro Abs: 5.4 10*3/uL (ref 1.7–7.7)
Neutrophils Relative %: 70 %
Platelets: 190 10*3/uL (ref 150–400)
RBC: 4.14 MIL/uL — ABNORMAL LOW (ref 4.22–5.81)
RDW: 13.2 % (ref 11.5–15.5)
WBC: 7.8 10*3/uL (ref 4.0–10.5)
nRBC: 0 % (ref 0.0–0.2)

## 2020-04-28 LAB — I-STAT ARTERIAL BLOOD GAS, ED
Acid-base deficit: 19 mmol/L — ABNORMAL HIGH (ref 0.0–2.0)
Bicarbonate: 8.1 mmol/L — ABNORMAL LOW (ref 20.0–28.0)
Calcium, Ion: 1.19 mmol/L (ref 1.15–1.40)
HCT: 35 % — ABNORMAL LOW (ref 39.0–52.0)
Hemoglobin: 11.9 g/dL — ABNORMAL LOW (ref 13.0–17.0)
O2 Saturation: 96 %
Patient temperature: 97.4
Potassium: 5.5 mmol/L — ABNORMAL HIGH (ref 3.5–5.1)
Sodium: 134 mmol/L — ABNORMAL LOW (ref 135–145)
TCO2: 9 mmol/L — ABNORMAL LOW (ref 22–32)
pCO2 arterial: 22 mmHg — ABNORMAL LOW (ref 32.0–48.0)
pH, Arterial: 7.17 — CL (ref 7.350–7.450)
pO2, Arterial: 97 mmHg (ref 83.0–108.0)

## 2020-04-28 LAB — MAGNESIUM: Magnesium: 2.7 mg/dL — ABNORMAL HIGH (ref 1.7–2.4)

## 2020-04-28 LAB — TROPONIN I (HIGH SENSITIVITY)
Troponin I (High Sensitivity): 7 ng/L (ref <18)
Troponin I (High Sensitivity): 8 ng/L (ref <18)

## 2020-04-28 LAB — GLUCOSE, CAPILLARY: Glucose-Capillary: 102 mg/dL — ABNORMAL HIGH (ref 70–99)

## 2020-04-28 LAB — LACTIC ACID, PLASMA: Lactic Acid, Venous: 0.9 mmol/L (ref 0.5–1.9)

## 2020-04-28 MED ORDER — INSULIN ASPART 100 UNIT/ML ~~LOC~~ SOLN: 0.0000 [IU] | Freq: Three times a day (TID) | SUBCUTANEOUS | Status: DC

## 2020-04-28 MED ORDER — SODIUM CHLORIDE 0.9 % IV BOLUS
1000.0000 mL | Freq: Once | INTRAVENOUS | Status: AC
Start: 2020-04-28 — End: 2020-04-28
  Administered 2020-04-28: 1000 mL via INTRAVENOUS

## 2020-04-28 MED ORDER — THIAMINE HCL 100 MG PO TABS
100.0000 mg | ORAL_TABLET | Freq: Every day | ORAL | Status: DC
  Administered 2020-04-29 – 2020-05-02 (×4): 100 mg via ORAL
  Filled 2020-04-28 (×4): qty 1

## 2020-04-28 MED ORDER — INSULIN ASPART 100 UNIT/ML ~~LOC~~ SOLN: 2.0000 [IU] | Freq: Three times a day (TID) | SUBCUTANEOUS | Status: DC

## 2020-04-28 MED ORDER — ACETAMINOPHEN 650 MG RE SUPP: 650.0000 mg | Freq: Four times a day (QID) | RECTAL | Status: DC | PRN

## 2020-04-28 MED ORDER — FOLIC ACID 1 MG PO TABS
1.0000 mg | ORAL_TABLET | Freq: Every day | ORAL | Status: DC
  Administered 2020-04-29 – 2020-05-02 (×4): 1 mg via ORAL
  Filled 2020-04-28 (×4): qty 1

## 2020-04-28 MED ORDER — ONDANSETRON HCL 4 MG PO TABS: 4.0000 mg | ORAL_TABLET | Freq: Four times a day (QID) | ORAL | Status: DC | PRN

## 2020-04-28 MED ORDER — SODIUM CHLORIDE 0.9 % IV BOLUS
1000.0000 mL | Freq: Once | INTRAVENOUS | Status: AC
  Administered 2020-04-28: 1000 mL via INTRAVENOUS

## 2020-04-28 MED ORDER — ACETAMINOPHEN 325 MG PO TABS: 650.0000 mg | ORAL_TABLET | Freq: Four times a day (QID) | ORAL | Status: DC | PRN

## 2020-04-28 MED ORDER — HYDROCODONE-ACETAMINOPHEN 5-325 MG PO TABS
1.0000 | ORAL_TABLET | ORAL | Status: DC | PRN
  Administered 2020-04-30 – 2020-05-01 (×2): 1 via ORAL
  Filled 2020-04-28 (×2): qty 1

## 2020-04-28 MED ORDER — POLYETHYLENE GLYCOL 3350 17 G PO PACK: 17.0000 g | PACK | Freq: Every day | ORAL | Status: DC | PRN

## 2020-04-28 MED ORDER — ONDANSETRON HCL 4 MG/2ML IJ SOLN: 4.0000 mg | Freq: Four times a day (QID) | INTRAMUSCULAR | Status: DC | PRN

## 2020-04-28 MED ORDER — ALBUTEROL SULFATE (2.5 MG/3ML) 0.083% IN NEBU: 2.5000 mg | INHALATION_SOLUTION | RESPIRATORY_TRACT | Status: DC | PRN

## 2020-04-28 MED ORDER — ADULT MULTIVITAMIN W/MINERALS CH
1.0000 | ORAL_TABLET | Freq: Every day | ORAL | Status: DC
  Administered 2020-04-29 – 2020-05-02 (×4): 1 via ORAL
  Filled 2020-04-28 (×4): qty 1

## 2020-04-28 MED ORDER — SODIUM BICARBONATE 8.4 % IV SOLN
INTRAVENOUS | Status: DC
  Filled 2020-04-28 (×6): qty 1000

## 2020-04-28 MED ORDER — HEPARIN SODIUM (PORCINE) 5000 UNIT/ML IJ SOLN
5000.0000 [IU] | Freq: Three times a day (TID) | INTRAMUSCULAR | Status: DC
  Administered 2020-04-28 – 2020-05-02 (×11): 5000 [IU] via SUBCUTANEOUS
  Filled 2020-04-28 (×11): qty 1

## 2020-04-28 NOTE — ED Notes (Signed)
Report given. Denied questions or concerns.

## 2020-04-28 NOTE — ED Notes (Signed)
PT b/p on arrival 87/48. MD Vanita Panda notified. RN to start 1,000 cc bolus NS.

## 2020-04-28 NOTE — ED Notes (Signed)
Report attempted stated RN was unavailable would call this RN back

## 2020-04-28 NOTE — ED Triage Notes (Signed)
BIB [facility] after pt called to report increase dizziness, weakness, diarrhea x 2 week. Pt endorses being worse today. Per EMS pt hypotensive on scene 68/34 but came up to 91/46 with 400 cc bolus. Pt also c/o of photosensitivity and poor intake.
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
## 2020-04-28 NOTE — ED Notes (Signed)
RN attempted bloodwork  2 unsuccessful. [place] aware
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
## 2020-04-28 NOTE — Plan of Care (Signed)
  Problem: Education: Goal: Knowledge of disease and its progression will improve Outcome: Progressing Goal: Individualized Educational Video(s) Outcome: Progressing   Problem: Fluid Volume: Goal: Compliance with measures to maintain balanced fluid volume will improve Outcome: Progressing   Problem: Health Behavior/Discharge Planning: Goal: Ability to manage health-related needs will improve Outcome: Progressing   Problem: Nutritional: Goal: Ability to make healthy dietary choices will improve Outcome: Progressing   Problem: Clinical Measurements: Goal: Complications related to the disease process, condition or treatment will be avoided or minimized Outcome: Progressing   Problem: Clinical Measurements: Goal: Complications related to the disease process, condition or treatment will be avoided or minimized Outcome: Progressing

## 2020-04-29 DIAGNOSIS — I10 Essential (primary) hypertension: Secondary | ICD-10-CM

## 2020-04-29 DIAGNOSIS — N179 Acute kidney failure, unspecified: Secondary | ICD-10-CM | POA: Diagnosis not present

## 2020-04-29 DIAGNOSIS — E872 Acidosis: Secondary | ICD-10-CM | POA: Diagnosis not present

## 2020-04-29 DIAGNOSIS — E875 Hyperkalemia: Secondary | ICD-10-CM | POA: Diagnosis not present

## 2020-04-29 LAB — GLUCOSE, CAPILLARY
Glucose-Capillary: 125 mg/dL — ABNORMAL HIGH (ref 70–99)
Glucose-Capillary: 159 mg/dL — ABNORMAL HIGH (ref 70–99)
Glucose-Capillary: 147 mg/dL — ABNORMAL HIGH (ref 70–99)
Glucose-Capillary: 168 mg/dL — ABNORMAL HIGH (ref 70–99)
Glucose-Capillary: 134 mg/dL — ABNORMAL HIGH (ref 70–99)

## 2020-04-29 LAB — CBC
HCT: 33.5 % — ABNORMAL LOW (ref 39.0–52.0)
Hemoglobin: 11.6 g/dL — ABNORMAL LOW (ref 13.0–17.0)
MCH: 32.1 pg (ref 26.0–34.0)
MCHC: 34.6 g/dL (ref 30.0–36.0)
MCV: 92.8 fL (ref 80.0–100.0)
Platelets: 178 10*3/uL (ref 150–400)
RBC: 3.61 MIL/uL — ABNORMAL LOW (ref 4.22–5.81)
RDW: 13.2 % (ref 11.5–15.5)
WBC: 4.4 10*3/uL (ref 4.0–10.5)
nRBC: 0 % (ref 0.0–0.2)

## 2020-04-29 LAB — COMPREHENSIVE METABOLIC PANEL
ALT: 8 U/L (ref 0–44)
AST: 7 U/L — ABNORMAL LOW (ref 15–41)
Albumin: 3.2 g/dL — ABNORMAL LOW (ref 3.5–5.0)
Alkaline Phosphatase: 34 U/L — ABNORMAL LOW (ref 38–126)
Anion gap: 15 (ref 5–15)
BUN: 177 mg/dL — ABNORMAL HIGH (ref 8–23)
CO2: 14 mmol/L — ABNORMAL LOW (ref 22–32)
Calcium: 8.4 mg/dL — ABNORMAL LOW (ref 8.9–10.3)
Chloride: 107 mmol/L (ref 98–111)
Creatinine, Ser: 5.24 mg/dL — ABNORMAL HIGH (ref 0.61–1.24)
GFR, Estimated: 11 mL/min — ABNORMAL LOW (ref >60)
Glucose, Bld: 122 mg/dL — ABNORMAL HIGH (ref 70–99)
Potassium: 4.5 mmol/L (ref 3.5–5.1)
Sodium: 136 mmol/L (ref 135–145)
Total Bilirubin: 0.6 mg/dL (ref 0.3–1.2)
Total Protein: 5.8 g/dL — ABNORMAL LOW (ref 6.5–8.1)

## 2020-04-29 LAB — BASIC METABOLIC PANEL
Anion gap: 19 — ABNORMAL HIGH (ref 5–15)
BUN: 193 mg/dL — ABNORMAL HIGH (ref 8–23)
CO2: 10 mmol/L — ABNORMAL LOW (ref 22–32)
Calcium: 8.6 mg/dL — ABNORMAL LOW (ref 8.9–10.3)
Chloride: 106 mmol/L (ref 98–111)
Creatinine, Ser: 6.72 mg/dL — ABNORMAL HIGH (ref 0.61–1.24)
GFR, Estimated: 8 mL/min — ABNORMAL LOW (ref >60)
Glucose, Bld: 106 mg/dL — ABNORMAL HIGH (ref 70–99)
Potassium: 5.2 mmol/L — ABNORMAL HIGH (ref 3.5–5.1)
Sodium: 135 mmol/L (ref 135–145)

## 2020-04-29 LAB — SARS CORONAVIRUS 2 (TAT 6-24 HRS): SARS Coronavirus 2: NEGATIVE

## 2020-04-29 LAB — LIPASE, BLOOD: Lipase: 199 U/L — ABNORMAL HIGH (ref 11–51)

## 2020-04-29 LAB — LACTIC ACID, PLASMA: Lactic Acid, Venous: 1.3 mmol/L (ref 0.5–1.9)

## 2020-04-29 LAB — HEMOGLOBIN A1C
Hgb A1c MFr Bld: 6.6 % — ABNORMAL HIGH (ref 4.8–5.6)
Mean Plasma Glucose: 142.72 mg/dL

## 2020-04-29 MED ORDER — SODIUM ZIRCONIUM CYCLOSILICATE 10 G PO PACK
10.0000 g | PACK | Freq: Two times a day (BID) | ORAL | Status: DC
  Administered 2020-04-29: 10 g via ORAL
  Filled 2020-04-29: qty 1

## 2020-04-29 MED ORDER — INSULIN ASPART 100 UNIT/ML ~~LOC~~ SOLN
0.0000 [IU] | Freq: Three times a day (TID) | SUBCUTANEOUS | Status: DC
  Administered 2020-04-30: 1 [IU] via SUBCUTANEOUS

## 2020-04-29 MED ORDER — INSULIN ASPART 100 UNIT/ML ~~LOC~~ SOLN: 0.0000 [IU] | Freq: Every day | SUBCUTANEOUS | Status: DC

## 2020-04-29 NOTE — Evaluation (Signed)
Physical Therapy Evaluation Patient Details Name: John Blake MRN: [id number] DOB: 1945-04-07 Today's Date: 04/29/2020   History of Present Illness  75 yo male presents to [facility] on 4/13 with weakness, fatigue, and diarrhea. Pt found to have AKI on CKD. PMH includes CKD stage IIIb, HTN, HLD, DM-2, MGUS, gout, depression, COPD, L RTC repair 2003.  Clinical Impression   Pt presents with generalized weakness, impaired activity tolerance vs baseline, and mild difficulty performing mobility tasks. Pt also endorses unsteadiness in standing and limited tolerance for gait, although PT did not observe due to pt fatigue. Pt to benefit from acute PT to address deficits. Pt requiring min assist for bed level tasks today, declines OOB mobility secondary to fatigue. Pt tearful multiple times during session, reporting "I can't see with bright lights" over the past two weeks and is upset over wife's health. Suspect pt will need HHPT at d/c, difficult to discern due to limited eval. PT to progress mobility as tolerated, and will continue to follow acutely.      Follow Up Recommendations Home health PT (TBD pending progress)    Equipment Recommendations  None recommended by PT    Recommendations for Other Services       Precautions / Restrictions Precautions Precautions: Fall Restrictions Weight Bearing Restrictions: No      Mobility  Bed Mobility Overal bed mobility: Needs Assistance Bed Mobility: Supine to Sit;Sit to Supine     Supine to sit: Min assist Sit to supine: Supervision   General bed mobility comments: min assist for trunk elevation via HHA, increased time and effort. Supervision for return to supine    Transfers                 General transfer comment: Pt declined, ambulatory to and from bathroom prior to PT arrival  Ambulation/Gait                Stairs            Wheelchair Mobility    Modified Rankin (Stroke Patients Only)       Balance  Overall balance assessment: Needs assistance Sitting-balance support: No upper extremity supported;Feet supported Sitting balance-Leahy Scale: Fair         Standing balance comment: nt                             Pertinent Vitals/Pain Pain Assessment: No/denies pain    Home Living Family/patient expects to be discharged to:: Private residence Living Arrangements: Spouse/significant other (pt states wife has health issues) Available Help at Discharge: Family;Available PRN/intermittently Type of Home: House Home Access: Stairs to enter Entrance Stairs-Rails: Psychiatric nurse of Steps: 5 Home Layout: One level Home Equipment: Walker - 2 wheels;Cane - single point;Crutches      Prior Function Level of Independence: Independent               Hand Dominance   Dominant Hand: Right    Extremity/Trunk Assessment   Upper Extremity Assessment Upper Extremity Assessment: Defer to OT evaluation    Lower Extremity Assessment Lower Extremity Assessment: Generalized weakness    Cervical / Trunk Assessment Cervical / Trunk Assessment: Normal  Communication   Communication: No difficulties  Cognition Arousal/Alertness: Awake/alert Behavior During Therapy: WFL for tasks assessed/performed Overall Cognitive Status: Within Functional Limits for tasks assessed  General Comments: Pt appears with depressed affect, tearful x3 during session regarding former work, wife's health status, and recent visual changes      General Comments      Exercises     Assessment/Plan    PT Assessment Patient needs continued PT services  PT Problem List Decreased strength;Decreased mobility;Decreased activity tolerance;Decreased balance;Decreased knowledge of use of DME;Pain;Cardiopulmonary status limiting activity;Decreased safety awareness       PT Treatment Interventions DME instruction;Therapeutic  activities;Gait training;Therapeutic exercise;Patient/family education;Balance training;Stair training;Functional mobility training;Neuromuscular re-education    PT Goals (Current goals can be found in the Care Plan section)  Acute Rehab PT Goals PT Goal Formulation: With patient Time For Goal Achievement: 05/13/20 Potential to Achieve Goals: Good    Frequency Min 3X/week   Barriers to discharge        Co-evaluation               AM-PAC PT "6 Clicks" Mobility  Outcome Measure Help needed turning from your back to your side while in a flat bed without using bedrails?: A Little Help needed moving from lying on your back to sitting on the side of a flat bed without using bedrails?: A Little Help needed moving to and from a bed to a chair (including a wheelchair)?: A Little Help needed standing up from a chair using your arms (e.g., wheelchair or bedside chair)?: A Little Help needed to walk in hospital room?: A Little Help needed climbing 3-5 steps with a railing? : A Little 6 Click Score: 18    End of Session   Activity Tolerance: Patient limited by fatigue Patient left: in bed;with call bell/phone within reach;with bed alarm set;with family/visitor present Nurse Communication: Mobility status PT Visit Diagnosis: Other abnormalities of gait and mobility (R26.89);Difficulty in walking, not elsewhere classified (R26.2)    Time: 6759-1638 PT Time Calculation (min) (ACUTE ONLY): 20 min   Charges:   PT Evaluation $PT Eval Low Complexity: 1 Low        Stacie Glaze, PT DPT Acute Rehabilitation Services Pager [phone]  Office [phone]   Louis Matte 04/29/2020, 5:34 PM

## 2020-04-29 NOTE — Plan of Care (Signed)

## 2020-04-29 NOTE — Progress Notes (Signed)
PROGRESS NOTE        PATIENT DETAILS Name: John Blake Age: 75 y.o. Sex: male Date of Birth: August 05, 1945 Admit Date: 04/28/2020 Admitting Physician Allie Dimmer, MD IZT:[id number], Delfino Lovett, NP  Brief Narrative: Patient is a 75 y.o. male with history of CKD stage IIIb, HTN, HLD, DM-2, MGUS, gout-who presented to the hospital with weakness and fatigue-apparently was been having intermittent diarrhea for the past several weeks as well-upon further evaluation-patient was found to have AKI, hyperkalemia-he was subsequently admitted to the hospitalist service.  See below for further details.  Significant events: 4/13>> admit for evaluation of AKI  Significant studies: 4/13>> CT head: No acute abnormalities 4/13>> CT abdomen/pelvis: No fluid collection, pseudocyst or abscess.  Fat stranding around the pancreatic head/duodenum.  Antimicrobial therapy: None  Microbiology data: 4/14>> blood culture: No growth  Procedures : None  Consults: Nephrology  DVT Prophylaxis : heparin injection 5,000 Units Start: 04/28/20 2200   Subjective: Diarrhea has resolved-no other complaints.  No chest pain or shortness of breath.   Assessment/Plan: AKI on CKD stage IIIb: AKI hemodynamically mediated in the setting of diarrhea/poor oral intake/ARB use.  Renal function slowly improving-remains on IVF.  Continue to hold ARB/HCTZ for now.  Hyperkalemia: Improved-add Lokelma for a few days-remains on bicarbonate infusion.  Anion gap metabolic acidosis: Due to AKI-improved.  Remains on bicarb infusion  Diarrhea: Unclear etiology-has resolved.  HTN: BP stable-monitor off antihypertensives.  DM-2 (A1c 6.6 on 4/14): CBG stable with SSI  Recent Labs    04/28/20 2207 04/29/20 0757  GLUCAP 102* 125*    COPD: No exacerbation-continue with as needed bronchodilators  History of MGUS: Follow SPEP/UPEP  History of EtOH use: No signs of withdrawal-watch  closely   Diet: Diet Order            Diet Carb Modified Fluid consistency: Thin; Room service appropriate? Yes  Diet effective now                  Code Status: Full code   Family Communication: Spouse-Judy Rotolo-[phone] over phone 4/14  Disposition Plan: Status is: Inpatient  Remains inpatient appropriate because:Inpatient level of care appropriate due to severity of illness   Dispo: The patient is from: Home              Anticipated d/c is to: Home              Patient currently is not medically stable to d/c.   Difficult to place patient No   Barriers to Discharge: AKI/hyperkalemia-on IV fluids-not yet stable for discharge  Antimicrobial agents: Anti-infectives (From admission, onward)   None       Time spent: 35 minutes-Greater than 50% of this time was spent in counseling, explanation of diagnosis, planning of further management, and coordination of care.  MEDICATIONS: Scheduled Meds: . folic acid  1 mg Oral Daily  . heparin  5,000 Units Subcutaneous Q8H  . insulin aspart  0-6 Units Subcutaneous TID WC  . multivitamin with minerals  1 tablet Oral Daily  . sodium zirconium cyclosilicate  10 g Oral BID  . thiamine  100 mg Oral Daily   Continuous Infusions: . sodium bicarbonate 150 mEq in D5W infusion 125 mL/hr at 04/29/20 0624   PRN Meds:.acetaminophen **OR** acetaminophen, albuterol, HYDROcodone-acetaminophen, ondansetron **OR** ondansetron (ZOFRAN) IV, polyethylene glycol   PHYSICAL EXAM: Vital signs:  Vitals:   04/28/20 2200 04/29/20 0052 04/29/20 0400 04/29/20 0755  BP:  108/64 106/82 (!) 110/50  Pulse:  83 84 84  Resp:  16 16 18   Temp:  98.2 F (36.8 C) 98.1 F (36.7 C) 97.7 F (36.5 C)  TempSrc:  Oral Oral Oral  SpO2:  98% 98%   Weight: 99.9 kg     Height: 6\' 1"  (1.854 m)      Filed Weights   04/28/20 2200  Weight: 99.9 kg   Body mass index is 29.06 kg/m.   Gen Exam:Alert awake-not in any  distress HEENT:atraumatic, normocephalic Chest: B/L clear to auscultation anteriorly CVS:S1S2 regular Abdomen:soft non tender, non distended Extremities:no edema Neurology: Non focal Skin: no rash  I have personally reviewed following labs and imaging studies  LABORATORY DATA: CBC: Recent Labs  Lab 04/28/20 1443 04/28/20 1949 04/29/20 0733  WBC 7.8  --  4.4  NEUTROABS 5.4  --   --   HGB 13.6 11.9* 11.6*  HCT 40.8 35.0* 33.5*  MCV 98.6  --  92.8  PLT 190  --  308    Basic Metabolic Panel: Recent Labs  Lab 04/28/20 1443 04/28/20 1949 04/29/20 0051 04/29/20 0733  NA 131* 134* 135 136  K 5.9* 5.5* 5.2* 4.5  CL 102  --  106 107  CO2 9*  --  10* 14*  GLUCOSE 115*  --  106* 122*  BUN 203*  --  193* 177*  CREATININE 9.19*  --  6.72* 5.24*  CALCIUM 8.8*  --  8.6* 8.4*  MG 2.7*  --   --   --     GFR: Estimated Creatinine Clearance: 15.8 mL/min (A) (by C-G formula based on SCr of 5.24 mg/dL (H)).  Liver Function Tests: Recent Labs  Lab 04/28/20 1443 04/29/20 0733  AST 8* 7*  ALT 10 8  ALKPHOS 42 34*  BILITOT 0.6 0.6  PROT 6.6 5.8*  ALBUMIN 3.6 3.2*   Recent Labs  Lab 04/29/20 0051  LIPASE 199*   No results for input(s): AMMONIA in the last 168 hours.  Coagulation Profile: No results for input(s): INR, PROTIME in the last 168 hours.  Cardiac Enzymes: No results for input(s): CKTOTAL, CKMB, CKMBINDEX, TROPONINI in the last 168 hours.  BNP (last 3 results) No results for input(s): PROBNP in the last 8760 hours.  Lipid Profile: No results for input(s): CHOL, HDL, LDLCALC, TRIG, CHOLHDL, LDLDIRECT in the last 72 hours.  Thyroid Function Tests: No results for input(s): TSH, T4TOTAL, FREET4, T3FREE, THYROIDAB in the last 72 hours.  Anemia Panel: No results for input(s): VITAMINB12, FOLATE, FERRITIN, TIBC, IRON, RETICCTPCT in the last 72 hours.  Urine analysis:    Component Value Date/Time   COLORURINE YELLOW 04/28/2020 2010   APPEARANCEUR CLEAR  04/28/2020 2010   LABSPEC 1.013 04/28/2020 2010   PHURINE 5.0 04/28/2020 2010   GLUCOSEU NEGATIVE 04/28/2020 2010   HGBUR SMALL (A) 04/28/2020 2010   BILIRUBINUR NEGATIVE 04/28/2020 2010   BILIRUBINUR negative 01/05/2017 [place] 04/28/2020 2010   PROTEINUR NEGATIVE 04/28/2020 2010   UROBILINOGEN 0.2 01/05/2017 1519   NITRITE NEGATIVE 04/28/2020 2010   LEUKOCYTESUR NEGATIVE 04/28/2020 2010    Sepsis Labs: Lactic Acid, Venous    Component Value Date/Time   LATICACIDVEN 1.3 04/29/2020 0051    MICROBIOLOGY: Recent Results (from the past 240 hour(s))  Culture, blood (routine x 2)     Status: None (Preliminary result)   Collection Time: 04/28/20  6:00 PM  Specimen: BLOOD LEFT ARM  Result Value Ref Range Status   Specimen Description BLOOD LEFT ARM  Final   Special Requests   Final    BOTTLES DRAWN AEROBIC AND ANAEROBIC Blood Culture adequate volume   Culture   Final    NO GROWTH < 24 HOURS Performed at [facility], [place] [address] [place], [place] [address]    Report Status PENDING  Incomplete  SARS CORONAVIRUS 2 (TAT 6-24 HRS) Nasopharyngeal Nasopharyngeal Swab     Status: None   Collection Time: 04/28/20  7:21 PM   Specimen: Nasopharyngeal Swab  Result Value Ref Range Status   SARS Coronavirus 2 NEGATIVE NEGATIVE Final    Comment: (NOTE) SARS-CoV-2 target nucleic acids are NOT DETECTED.  The SARS-CoV-2 RNA is generally detectable in upper and lower respiratory specimens during the acute phase of infection. Negative results do not preclude SARS-CoV-2 infection, do not rule out co-infections with other pathogens, and should not be used as the sole basis for treatment or other patient management decisions. Negative results must be combined with clinical observations, patient history, and epidemiological information. The expected result is Negative.  Fact Sheet for Patients: SugarRoll.be  Fact Sheet for Healthcare  Providers: https://www.woods-mathews.com/  This test is not yet approved or cleared by the [place] FDA and  has been authorized for detection and/or diagnosis of SARS-CoV-2 by FDA under an Emergency Use Authorization (EUA). This EUA will remain  in effect (meaning this test can be used) for the duration of the COVID-19 declaration under Se ction 564(b)(1) of the Act, 21 U.S.C. section 360bbb-3(b)(1), unless the authorization is terminated or revoked sooner.  Performed at [facility], [place] [address], [place], [place] [address]     RADIOLOGY STUDIES/RESULTS: CT ABDOMEN PELVIS WO CONTRAST  Result Date: 04/28/2020 CLINICAL DATA:  Abdominal pain EXAM: CT ABDOMEN AND PELVIS WITHOUT CONTRAST TECHNIQUE: Multidetector CT imaging of the abdomen and pelvis was performed following the standard protocol without IV contrast. COMPARISON:  10/10/2019 FINDINGS: Lower chest: No acute pleural or parenchymal lung disease. Hepatobiliary: No focal liver abnormality is seen. No gallstones, gallbladder wall thickening, or biliary dilatation. Pancreas: Mild fat stranding surrounding the head of the pancreas and duodenal sweep, which could reflect acute pancreatitis or duodenitis. Otherwise the pancreas is unremarkable on this limited unenhanced exam. Spleen: Normal in size without focal abnormality. Adrenals/Urinary Tract: No urinary tract calculi or obstructive uropathy. Multiple small bilateral renal cysts are again noted. The adrenals and bladder are unremarkable. Stomach/Bowel: No bowel obstruction or ileus. No bowel wall thickening. The appendix is surgically absent. Vascular/Lymphatic: Aortic atherosclerosis. No enlarged abdominal or pelvic lymph nodes. Reproductive: Prostate is unremarkable. Other: No free fluid or free gas. Fat containing bilateral inguinal hernias unchanged. Musculoskeletal: No acute or destructive bony lesions. Reconstructed images demonstrate no additional findings.  IMPRESSION: 1. Fat stranding surrounding the pancreatic head and duodenal sweep, which could reflect pancreatitis or duodenitis. No fluid collection, pseudocyst, or abscess. 2. Otherwise no acute intra-abdominal or intrapelvic process. 3. Stable fat containing bilateral inguinal hernias. 4.  Aortic Atherosclerosis (ICD10-I70.0). Electronically Signed   By: Randa Ngo M.D.   On: 04/28/2020 16:42   CT Head Wo Contrast  Result Date: 04/28/2020 CLINICAL DATA:  Dizziness. EXAM: CT HEAD WITHOUT CONTRAST TECHNIQUE: Contiguous axial images were obtained from the base of the skull through the vertex without intravenous contrast. COMPARISON:  None. FINDINGS: Brain: No evidence of acute infarction, hemorrhage, hydrocephalus, extra-axial collection or mass lesion/mass effect. Vascular: No hyperdense  vessel or unexpected calcification. Skull: Normal. Negative for fracture or focal lesion. Sinuses/Orbits: No acute finding. Other: None. IMPRESSION: No acute intracranial abnormality seen. Electronically Signed   By: Marijo Conception M.D.   On: 04/28/2020 16:36     LOS: 1 day   Oren Binet, MD  [facility]    To contact the attending provider between 7A-7P or the covering provider during after hours 7P-7A, please log into the web site [email or web address] and access using universal [place] password for that web site. If you do not have the password, please call the hospital operator.  04/29/2020, 10:28 AM

## 2020-04-29 NOTE — Progress Notes (Signed)
   04/29/20 1025  Clinical Encounter Type  Visited With Patient  Visit Type Initial  Referral From Nurse  Consult/Referral To Chaplain  Chaplain responded to consult request for Advance Directive. The patient stated he did not know what I was talking about. This chaplain provided education. The patient stated he did not request. Advised him chaplain remains available if needed. This note was prepared by Jeanine Luz, M.Div..  For questions please contact by phone [phone].

## 2020-04-29 NOTE — Progress Notes (Signed)
[facility] Progress Note   52M presenting with AKI in the setting of CKD 3, metabolic acidosis, mild hyperkalemia in setting of diarrhea,  ARB,  Colchicine w/ no e/o obstruction.    Assessment/ Plan:   1. AKI on CKD3 (BL 1.5, Sees [facility]) with AKI combination of hypovolemia from diarrhea and poor oral intake, ongoing ARB use. - Continue aggressive isotonic HCO3 fluids to replete volume loss; fortunately renal function is improving. BUN high because of prerenal state and catabolic state.   - No indication for RRT; will continue to follow closely. - Continue holding the ARB/HCTZ for now.  2. HTN on ARB/HCTZ as an outpt 3. Subacute diarrhea, ? Colchicine?  4. Anorexia, degree of weight loss currently unclear 5. Metabolic Acidosis, inc AG and NAG component, likely from #1 and #3; denies use of toxic alcohols 6. Hyperkalemia, mild, should correct as #5 corrects 7. Hx/o MGUS 8. Hx/o ETOH use, recent cessation 9. COPD/T user   Subjective:   Feels very thirsty and voiding often overnight. Denies f/c/n/v/ dyspnea/ cough.   Objective:   BP 106/82 (BP Location: Left Arm)   Pulse 84   Temp 98.1 F (36.7 C) (Oral)   Resp 16   Ht 6\' 1"  (1.854 m)   Wt 99.9 kg   SpO2 98%   BMI 29.06 kg/m   Intake/Output Summary (Last 24 hours) at 04/29/2020 0708 Last data filed at 04/28/2020 2006 Gross per 24 hour  Intake 2000 ml  Output 400 ml  Net 1600 ml   Weight change:   Physical Exam: GEN: Unwell appearing, not in acute distress ENT: NCAT EYES: EOMI CV: Regular, normal S1 and S2 PULM: Distant throughout but no rales noted ABD: Bowel sounds present, soft, protuberant SKIN: No petechia or purpura EXT: No edema NEURO: AAO, no asterixis  Imaging: CT ABDOMEN PELVIS WO CONTRAST  Result Date: 04/28/2020 CLINICAL DATA:  Abdominal pain EXAM: CT ABDOMEN AND PELVIS WITHOUT CONTRAST TECHNIQUE: Multidetector CT imaging of the abdomen and pelvis was performed following the  standard protocol without IV contrast. COMPARISON:  10/10/2019 FINDINGS: Lower chest: No acute pleural or parenchymal lung disease. Hepatobiliary: No focal liver abnormality is seen. No gallstones, gallbladder wall thickening, or biliary dilatation. Pancreas: Mild fat stranding surrounding the head of the pancreas and duodenal sweep, which could reflect acute pancreatitis or duodenitis. Otherwise the pancreas is unremarkable on this limited unenhanced exam. Spleen: Normal in size without focal abnormality. Adrenals/Urinary Tract: No urinary tract calculi or obstructive uropathy. Multiple small bilateral renal cysts are again noted. The adrenals and bladder are unremarkable. Stomach/Bowel: No bowel obstruction or ileus. No bowel wall thickening. The appendix is surgically absent. Vascular/Lymphatic: Aortic atherosclerosis. No enlarged abdominal or pelvic lymph nodes. Reproductive: Prostate is unremarkable. Other: No free fluid or free gas. Fat containing bilateral inguinal hernias unchanged. Musculoskeletal: No acute or destructive bony lesions. Reconstructed images demonstrate no additional findings. IMPRESSION: 1. Fat stranding surrounding the pancreatic head and duodenal sweep, which could reflect pancreatitis or duodenitis. No fluid collection, pseudocyst, or abscess. 2. Otherwise no acute intra-abdominal or intrapelvic process. 3. Stable fat containing bilateral inguinal hernias. 4.  Aortic Atherosclerosis (ICD10-I70.0). Electronically Signed   By: Randa Ngo M.D.   On: 04/28/2020 16:42   CT Head Wo Contrast  Result Date: 04/28/2020 CLINICAL DATA:  Dizziness. EXAM: CT HEAD WITHOUT CONTRAST TECHNIQUE: Contiguous axial images were obtained from the base of the skull through the vertex without intravenous contrast. COMPARISON:  None. FINDINGS: Brain: No evidence of acute  infarction, hemorrhage, hydrocephalus, extra-axial collection or mass lesion/mass effect. Vascular: No hyperdense vessel or unexpected  calcification. Skull: Normal. Negative for fracture or focal lesion. Sinuses/Orbits: No acute finding. Other: None. IMPRESSION: No acute intracranial abnormality seen. Electronically Signed   By: Marijo Conception M.D.   On: 04/28/2020 16:36    Labs: BMET Recent Labs  Lab 04/28/20 1443 04/28/20 1949 04/29/20 0051  NA 131* 134* 135  K 5.9* 5.5* 5.2*  CL 102  --  106  CO2 9*  --  10*  GLUCOSE 115*  --  106*  BUN 203*  --  193*  CREATININE 9.19*  --  6.72*  CALCIUM 8.8*  --  8.6*   CBC Recent Labs  Lab 04/28/20 1443 04/28/20 1949  WBC 7.8  --   NEUTROABS 5.4  --   HGB 13.6 11.9*  HCT 40.8 35.0*  MCV 98.6  --   PLT 190  --     Medications:    . folic acid  1 mg Oral Daily  . heparin  5,000 Units Subcutaneous Q8H  . insulin aspart  0-6 Units Subcutaneous TID WC  . multivitamin with minerals  1 tablet Oral Daily  . thiamine  100 mg Oral Daily      Otelia Santee, MD 04/29/2020, 7:08 AM

## 2020-04-30 DIAGNOSIS — N1832 Chronic kidney disease, stage 3b: Secondary | ICD-10-CM

## 2020-04-30 DIAGNOSIS — R197 Diarrhea, unspecified: Secondary | ICD-10-CM

## 2020-04-30 DIAGNOSIS — I10 Essential (primary) hypertension: Secondary | ICD-10-CM | POA: Diagnosis not present

## 2020-04-30 DIAGNOSIS — N179 Acute kidney failure, unspecified: Secondary | ICD-10-CM | POA: Diagnosis not present

## 2020-04-30 LAB — CBC
HCT: 31.4 % — ABNORMAL LOW (ref 39.0–52.0)
Hemoglobin: 10.9 g/dL — ABNORMAL LOW (ref 13.0–17.0)
MCH: 32.5 pg (ref 26.0–34.0)
MCHC: 34.7 g/dL (ref 30.0–36.0)
MCV: 93.7 fL (ref 80.0–100.0)
Platelets: 161 10*3/uL (ref 150–400)
RBC: 3.35 MIL/uL — ABNORMAL LOW (ref 4.22–5.81)
RDW: 13.2 % (ref 11.5–15.5)
WBC: 5.4 10*3/uL (ref 4.0–10.5)
nRBC: 0 % (ref 0.0–0.2)

## 2020-04-30 LAB — BASIC METABOLIC PANEL
Anion gap: 10 (ref 5–15)
BUN: 134 mg/dL — ABNORMAL HIGH (ref 8–23)
CO2: 22 mmol/L (ref 22–32)
Calcium: 8.1 mg/dL — ABNORMAL LOW (ref 8.9–10.3)
Chloride: 103 mmol/L (ref 98–111)
Creatinine, Ser: 2.39 mg/dL — ABNORMAL HIGH (ref 0.61–1.24)
GFR, Estimated: 28 mL/min — ABNORMAL LOW (ref >60)
Glucose, Bld: 128 mg/dL — ABNORMAL HIGH (ref 70–99)
Potassium: 3.6 mmol/L (ref 3.5–5.1)
Sodium: 135 mmol/L (ref 135–145)

## 2020-04-30 LAB — KAPPA/LAMBDA LIGHT CHAINS
Kappa free light chain: 119.4 mg/L — ABNORMAL HIGH (ref 3.3–19.4)
Kappa, lambda light chain ratio: 2.33 — ABNORMAL HIGH (ref 0.26–1.65)
Lambda free light chains: 51.3 mg/L — ABNORMAL HIGH (ref 5.7–26.3)

## 2020-04-30 LAB — GLUCOSE, CAPILLARY
Glucose-Capillary: 136 mg/dL — ABNORMAL HIGH (ref 70–99)
Glucose-Capillary: 154 mg/dL — ABNORMAL HIGH (ref 70–99)
Glucose-Capillary: 105 mg/dL — ABNORMAL HIGH (ref 70–99)
Glucose-Capillary: 123 mg/dL — ABNORMAL HIGH (ref 70–99)

## 2020-04-30 LAB — PROTEIN ELECTROPHORESIS, SERUM
A/G Ratio: 1.1 (ref 0.7–1.7)
Albumin ELP: 3.5 g/dL (ref 2.9–4.4)
Alpha-1-Globulin: 0.3 g/dL (ref 0.0–0.4)
Alpha-2-Globulin: 1.1 g/dL — ABNORMAL HIGH (ref 0.4–1.0)
Beta Globulin: 0.9 g/dL (ref 0.7–1.3)
Gamma Globulin: 0.8 g/dL (ref 0.4–1.8)
Globulin, Total: 3.1 g/dL (ref 2.2–3.9)
M-Spike, %: 0.2 g/dL — ABNORMAL HIGH
Total Protein ELP: 6.6 g/dL (ref 6.0–8.5)

## 2020-04-30 LAB — C DIFFICILE QUICK SCREEN W PCR REFLEX
C Diff antigen: POSITIVE — AB
C Diff toxin: NEGATIVE

## 2020-04-30 LAB — CLOSTRIDIUM DIFFICILE BY PCR, REFLEXED: Toxigenic C. Difficile by PCR: NEGATIVE

## 2020-04-30 MED ORDER — LACTATED RINGERS IV SOLN: INTRAVENOUS | Status: AC

## 2020-04-30 MED ORDER — PANTOPRAZOLE SODIUM 40 MG IV SOLR
40.0000 mg | Freq: Two times a day (BID) | INTRAVENOUS | Status: DC
  Administered 2020-04-30 – 2020-05-01 (×3): 40 mg via INTRAVENOUS
  Filled 2020-04-30 (×2): qty 40

## 2020-04-30 NOTE — Progress Notes (Signed)
Consulted for I.V. restart due to an infusion containing sodium bicarbonate infiltrated. Multiple I.v. restarts on this patient. Please consider placement of PICC or another centrally located catheter.

## 2020-04-30 NOTE — Progress Notes (Signed)
Physical Therapy Treatment Patient Details Name: John Blake MRN: [id number] DOB: 1945/04/29 Today's Date: 04/30/2020    History of Present Illness 75 yo male [facility] with weakness, fatigue, and diarrhea. Pt found to have AKI on CKD. PMH includes CKD stage IIIb, HTN, HLD, DM-2, MGUS, gout, depression, COPD, L RTC repair 2003.    PT Comments     Pt was seen for mobility in his room, has been tired and upset with his stomach issues.  Pt did agree to do bed ex after a short walk, with paced effort and frequent rsts.  Pt was instructed about keeping his hands clean to avoid further problems with enteric infection, and he agreed to wash hands more often.  Follow along for acute PT goals, as written below.  Follow Up Recommendations  Home health PT     Equipment Recommendations  None recommended by PT    Recommendations for Other Services       Precautions / Restrictions Precautions Precautions: Fall Precaution Comments: enteric Restrictions Weight Bearing Restrictions: No    Mobility  Bed Mobility Overal bed mobility: Modified Independent Bed Mobility: Sit to Supine     Supine to sit: Min guard Sit to supine: Min guard        Transfers Overall transfer level: Modified independent Equipment used: None             General transfer comment: min guard to walk but stands without help  Ambulation/Gait Ambulation/Gait assistance: Min guard Gait Distance (Feet): 38 Feet Assistive device: None Gait Pattern/deviations: Decreased stride length;Shuffle     General Gait Details: lateral instability   Stairs             Wheelchair Mobility    Modified Rankin (Stroke Patients Only)       Balance Overall balance assessment: Needs assistance Sitting-balance support: Bilateral upper extremity supported;Feet supported Sitting balance-Leahy Scale: Good     Standing balance support: No upper extremity supported Standing balance-Leahy Scale:  Fair                              Cognition Arousal/Alertness: Awake/alert Behavior During Therapy: WFL for tasks assessed/performed Overall Cognitive Status: Within Functional Limits for tasks assessed                                 General Comments: depressed      Exercises General Exercises - Lower Extremity Ankle Circles/Pumps: AAROM;5 reps Quad Sets: AROM;10 reps Gluteal Sets: AROM;10 reps Heel Slides: AROM;10 reps Hip ABduction/ADduction: AROM;10 reps    General Comments        Pertinent Vitals/Pain Pain Assessment: Faces Faces Pain Scale: No hurt    Home Living                      Prior Function            PT Goals (current goals can now be found in the care plan section) Acute Rehab PT Goals Patient Stated Goal: none Progress towards PT goals: Progressing toward goals    Frequency    Min 3X/week      PT Plan Current plan remains appropriate    Co-evaluation              AM-PAC PT "6 Clicks" Mobility   Outcome Measure  Help needed turning from your  back to your side while in a flat bed without using bedrails?: A Little Help needed moving from lying on your back to sitting on the side of a flat bed without using bedrails?: A Little Help needed moving to and from a bed to a chair (including a wheelchair)?: A Little Help needed standing up from a chair using your arms (e.g., wheelchair or bedside chair)?: A Little Help needed to walk in hospital room?: A Little Help needed climbing 3-5 steps with a railing? : A Lot 6 Click Score: 17    End of Session Equipment Utilized During Treatment: Gait belt Activity Tolerance: Patient limited by fatigue Patient left: in bed;with call bell/phone within reach;with bed alarm set;with family/visitor present Nurse Communication: Mobility status PT Visit Diagnosis: Other abnormalities of gait and mobility (R26.89);Difficulty in walking, not elsewhere classified (R26.2)      Time: 8127-5170 PT Time Calculation (min) (ACUTE ONLY): 26 min  Charges:  $Gait Training: 8-22 mins $Therapeutic Exercise: 8-22 mins Ramond Dial 04/30/2020, 10:44 PM  Mee Hives, PT MS Acute Rehab Dept. Number: [place]

## 2020-04-30 NOTE — Progress Notes (Signed)
PROGRESS NOTE        PATIENT DETAILS Name: John Blake Age: 75 y.o. Sex: male Date of Birth: 1945-07-22 Admit Date: 04/28/2020 Admitting Physician Allie Dimmer, MD [id number], Delfino Lovett, NP  Brief Narrative: Patient is a 75 y.o. male with history of CKD stage IIIb, HTN, HLD, DM-2, MGUS, gout-who presented to the hospital with weakness and fatigue-apparently was been having intermittent diarrhea for the past several weeks as well-upon further evaluation-patient was found to have AKI, hyperkalemia-he was subsequently admitted to the hospitalist service.  See below for further details.  Significant events: 4/13>> admit for evaluation of AKI  Significant studies: 4/13>> CT head: No acute abnormalities 4/13>> CT abdomen/pelvis: No fluid collection, pseudocyst or abscess.  Fat stranding around the pancreatic head/duodenum.  Antimicrobial therapy: None  Microbiology data: 4/14>> blood culture: No growth  Procedures : None  Consults: Nephrology  DVT Prophylaxis : heparin injection 5,000 Units Start: 04/28/20 2200   Subjective: Diarrhea yesterday evening-has had reoccurred has not multiple episodes overnight.  Describes stools as loose and watery.  No abdominal pain.   Assessment/Plan: AKI on CKD stage IIIb: AKI hemodynamically mediated in the setting of diarrhea/poor oral intake/ARB use.  Renal function continues to improve-continue to hold ARB/HCTZ.  Follow renal function-nephrology following closely.  Continue to avoid nephrotoxic agents.  Continue IV fluid-we will switch to Ringer's lactate.  Hyperkalemia: Resolved with bicarbonate infusion and Lokelma.  Anion gap metabolic acidosis: Due to AKI-improved.  Will switch to Ringer's lactate-stop IV fluids with bicarb.  Diarrhea: Had improved-but reoccurred last evening-multiple episodes of loose watery BM overnight per patient.  Awaiting GI pathogen panel-C. difficile antigen positive-patient is a  poor historian-does not remember if he took antibiotics recently-spoke to his spouse-she is also not clear but thinks he was recently on an antibiotic for "wheezing/respiratory issues".  Patient also was on colchicine in the outpatient setting that could have caused some diarrhea-we will watch for another day-if diarrhea does not get better-may need to consider starting oral vancomycin.  HTN: BP stable-monitor off antihypertensives.  DM-2 (A1c 6.6 on 4/14): CBG stable with SSI  Recent Labs    04/29/20 2027 04/29/20 2210 04/30/20 0753  GLUCAP 168* 134* 136*    COPD: No exacerbation-continue with as needed bronchodilators  History of MGUS: Follow SPEP/UPEP  History of EtOH use: No signs of withdrawal-watch closely   Diet: Diet Order            Diet Carb Modified Fluid consistency: Thin; Room service appropriate? Yes  Diet effective now                  Code Status: Full code   Family Communication: Spouse-Judy Pires-[phone] over phone 4/15  Disposition Plan: Status is: Inpatient  Remains inpatient appropriate because:Inpatient level of care appropriate due to severity of illness   Dispo: The patient is from: Home              Anticipated d/c is to: Home              Patient currently is not medically stable to d/c.   Difficult to place patient No   Barriers to Discharge: AKI/hyperkalemia-on IV fluids-not yet stable for discharge  Antimicrobial agents: Anti-infectives (From admission, onward)   None       Time spent: 25 minutes-Greater than 50% of this time was spent in  counseling, explanation of diagnosis, planning of further management, and coordination of care.  MEDICATIONS: Scheduled Meds: . folic acid  1 mg Oral Daily  . heparin  5,000 Units Subcutaneous Q8H  . insulin aspart  0-5 Units Subcutaneous QHS  . insulin aspart  0-6 Units Subcutaneous TID WC  . multivitamin with minerals  1 tablet Oral Daily  . thiamine  100 mg Oral  Daily   Continuous Infusions: . sodium bicarbonate 150 mEq in D5W infusion Stopped (04/30/20 0600)   PRN Meds:.acetaminophen **OR** acetaminophen, albuterol, HYDROcodone-acetaminophen, ondansetron **OR** ondansetron (ZOFRAN) IV   PHYSICAL EXAM: Vital signs: Vitals:   04/29/20 2012 04/29/20 2357 04/30/20 0407 04/30/20 0753  BP: 121/60 103/66 119/66 122/64  Pulse: 89 89 91 88  Resp: 17 14 14 16   Temp: 98.3 F (36.8 C) 98.3 F (36.8 C) 98.2 F (36.8 C) 98.3 F (36.8 C)  TempSrc: Axillary Axillary Oral Oral  SpO2: 97% 97% 97% 97%  Weight:      Height:       Filed Weights   04/28/20 2200  Weight: 99.9 kg   Body mass index is 29.06 kg/m.   Gen Exam:Alert awake-not in any distress HEENT:atraumatic, normocephalic Chest: B/L clear to auscultation anteriorly CVS:S1S2 regular Abdomen:soft non tender, non distended Extremities:no edema Neurology: Non focal Skin: no rash  I have personally reviewed following labs and imaging studies  LABORATORY DATA: CBC: Recent Labs  Lab 04/28/20 1443 04/28/20 1949 04/29/20 0733  WBC 7.8  --  4.4  NEUTROABS 5.4  --   --   HGB 13.6 11.9* 11.6*  HCT 40.8 35.0* 33.5*  MCV 98.6  --  92.8  PLT 190  --  161    Basic Metabolic Panel: Recent Labs  Lab 04/28/20 1443 04/28/20 1949 04/29/20 0051 04/29/20 0733 04/30/20 0651  NA 131* 134* 135 136 135  K 5.9* 5.5* 5.2* 4.5 3.6  CL 102  --  106 107 103  CO2 9*  --  10* 14* 22  GLUCOSE 115*  --  106* 122* 128*  BUN 203*  --  193* 177* 134*  CREATININE 9.19*  --  6.72* 5.24* 2.39*  CALCIUM 8.8*  --  8.6* 8.4* 8.1*  MG 2.7*  --   --   --   --     GFR: Estimated Creatinine Clearance: 34.7 mL/min (A) (by C-G formula based on SCr of 2.39 mg/dL (H)).  Liver Function Tests: Recent Labs  Lab 04/28/20 1443 04/29/20 0733  AST 8* 7*  ALT 10 8  ALKPHOS 42 34*  BILITOT 0.6 0.6  PROT 6.6 5.8*  ALBUMIN 3.6 3.2*   Recent Labs  Lab 04/29/20 0051  LIPASE 199*   No results for  input(s): AMMONIA in the last 168 hours.  Coagulation Profile: No results for input(s): INR, PROTIME in the last 168 hours.  Cardiac Enzymes: No results for input(s): CKTOTAL, CKMB, CKMBINDEX, TROPONINI in the last 168 hours.  BNP (last 3 results) No results for input(s): PROBNP in the last 8760 hours.  Lipid Profile: No results for input(s): CHOL, HDL, LDLCALC, TRIG, CHOLHDL, LDLDIRECT in the last 72 hours.  Thyroid Function Tests: No results for input(s): TSH, T4TOTAL, FREET4, T3FREE, THYROIDAB in the last 72 hours.  Anemia Panel: No results for input(s): VITAMINB12, FOLATE, FERRITIN, TIBC, IRON, RETICCTPCT in the last 72 hours.  Urine analysis:    Component Value Date/Time   COLORURINE YELLOW 04/28/2020 2010   APPEARANCEUR CLEAR 04/28/2020 2010   LABSPEC 1.013 04/28/2020 2010  PHURINE 5.0 04/28/2020 2010   GLUCOSEU NEGATIVE 04/28/2020 2010   HGBUR SMALL (A) 04/28/2020 2010   BILIRUBINUR NEGATIVE 04/28/2020 2010   BILIRUBINUR negative 01/05/2017 [place] 04/28/2020 2010   PROTEINUR NEGATIVE 04/28/2020 2010   UROBILINOGEN 0.2 01/05/2017 1519   NITRITE NEGATIVE 04/28/2020 2010   LEUKOCYTESUR NEGATIVE 04/28/2020 2010    Sepsis Labs: Lactic Acid, Venous    Component Value Date/Time   LATICACIDVEN 1.3 04/29/2020 0051    MICROBIOLOGY: Recent Results (from the past 240 hour(s))  Culture, blood (routine x 2)     Status: None (Preliminary result)   Collection Time: 04/28/20  6:00 PM   Specimen: BLOOD LEFT ARM  Result Value Ref Range Status   Specimen Description BLOOD LEFT ARM  Final   Special Requests   Final    BOTTLES DRAWN AEROBIC AND ANAEROBIC Blood Culture adequate volume   Culture   Final    NO GROWTH 2 DAYS Performed at [facility], [place] [address] [place], [place] [address]    Report Status PENDING  Incomplete  SARS CORONAVIRUS 2 (TAT 6-24 HRS) Nasopharyngeal Nasopharyngeal Swab     Status: None   Collection Time: 04/28/20  7:21  PM   Specimen: Nasopharyngeal Swab  Result Value Ref Range Status   SARS Coronavirus 2 NEGATIVE NEGATIVE Final    Comment: (NOTE) SARS-CoV-2 target nucleic acids are NOT DETECTED.  The SARS-CoV-2 RNA is generally detectable in upper and lower respiratory specimens during the acute phase of infection. Negative results do not preclude SARS-CoV-2 infection, do not rule out co-infections with other pathogens, and should not be used as the sole basis for treatment or other patient management decisions. Negative results must be combined with clinical observations, patient history, and epidemiological information. The expected result is Negative.  Fact Sheet for Patients: SugarRoll.be  Fact Sheet for Healthcare Providers: https://www.woods-mathews.com/  This test is not yet approved or cleared by the [place] FDA and  has been authorized for detection and/or diagnosis of SARS-CoV-2 by FDA under an Emergency Use Authorization (EUA). This EUA will remain  in effect (meaning this test can be used) for the duration of the COVID-19 declaration under Se ction 564(b)(1) of the Act, 21 U.S.C. section 360bbb-3(b)(1), unless the authorization is terminated or revoked sooner.  Performed at [facility], [place] [address], [place], [place] [address]   Culture, blood (routine x 2)     Status: None (Preliminary result)   Collection Time: 04/29/20 12:51 AM   Specimen: BLOOD LEFT HAND  Result Value Ref Range Status   Specimen Description BLOOD LEFT HAND  Final   Special Requests   Final    BOTTLES DRAWN AEROBIC AND ANAEROBIC Blood Culture adequate volume   Culture   Final    NO GROWTH 1 DAY Performed at [facility], [place] [address] [place], [place] [address]    Report Status PENDING  Incomplete  C Difficile Quick Screen w PCR reflex     Status: Abnormal   Collection Time: 04/30/20 12:50 AM   Specimen: STOOL  Result Value Ref Range Status    C Diff antigen POSITIVE (A) NEGATIVE Final   C Diff toxin NEGATIVE NEGATIVE Final   C Diff interpretation Results are indeterminate. See PCR results.  Final    Comment: Performed at [facility], [place] [address], [place], [place] [address]    RADIOLOGY STUDIES/RESULTS: CT ABDOMEN PELVIS WO CONTRAST  Result Date: 04/28/2020 CLINICAL DATA:  Abdominal pain EXAM: CT ABDOMEN AND PELVIS WITHOUT CONTRAST TECHNIQUE: Multidetector CT imaging of the abdomen and pelvis was performed following the standard protocol without IV contrast. COMPARISON:  10/10/2019 FINDINGS: Lower chest: No acute pleural or parenchymal lung disease. Hepatobiliary: No focal liver abnormality is seen. No gallstones, gallbladder wall thickening, or biliary dilatation. Pancreas: Mild fat stranding surrounding the head of the pancreas and duodenal sweep, which could reflect acute pancreatitis or duodenitis. Otherwise the pancreas is unremarkable on this limited unenhanced exam. Spleen: Normal in size without focal abnormality. Adrenals/Urinary Tract: No urinary tract calculi or obstructive uropathy. Multiple small bilateral renal cysts are again noted. The adrenals and bladder are unremarkable. Stomach/Bowel: No bowel obstruction or ileus. No bowel wall thickening. The appendix is surgically absent. Vascular/Lymphatic: Aortic atherosclerosis. No enlarged abdominal or pelvic lymph nodes. Reproductive: Prostate is unremarkable. Other: No free fluid or free gas. Fat containing bilateral inguinal hernias unchanged. Musculoskeletal: No acute or destructive bony lesions. Reconstructed images demonstrate no additional findings. IMPRESSION: 1. Fat stranding surrounding the pancreatic head and duodenal sweep, which could reflect pancreatitis or duodenitis. No fluid collection, pseudocyst, or abscess. 2. Otherwise no acute intra-abdominal or intrapelvic process. 3. Stable fat containing bilateral inguinal hernias. 4.  Aortic Atherosclerosis  (ICD10-I70.0). Electronically Signed   By: Randa Ngo M.D.   On: 04/28/2020 16:42   CT Head Wo Contrast  Result Date: 04/28/2020 CLINICAL DATA:  Dizziness. EXAM: CT HEAD WITHOUT CONTRAST TECHNIQUE: Contiguous axial images were obtained from the base of the skull through the vertex without intravenous contrast. COMPARISON:  None. FINDINGS: Brain: No evidence of acute infarction, hemorrhage, hydrocephalus, extra-axial collection or mass lesion/mass effect. Vascular: No hyperdense vessel or unexpected calcification. Skull: Normal. Negative for fracture or focal lesion. Sinuses/Orbits: No acute finding. Other: None. IMPRESSION: No acute intracranial abnormality seen. Electronically Signed   By: Marijo Conception M.D.   On: 04/28/2020 16:36     LOS: 2 days   Oren Binet, MD  [facility]    To contact the attending provider between 7A-7P or the covering provider during after hours 7P-7A, please log into the web site [email or web address] and access using universal [place] password for that web site. If you do not have the password, please call the hospital operator.  04/30/2020, 11:33 AM

## 2020-04-30 NOTE — Progress Notes (Signed)
RN noticed pts IV was leaking and there was redness and swelling. RN immediately stopped drip, elevated arm, and consulted IV team and the MD on call.Patient did not complain of pain, numbness, nor tingling. Arm did not feel abnormally warm nor cool and pulses were +2 bilaterally. Per IV team, medical team should consider PICC placement. RN informed MD and new orders for labs were placed. Per MD, in addition to elevating arm, place ice pack to aid in decreasing swelling. RN also consulted Rapid RN and Rapid RN shared same sentiments as MD. This RN also informed CN. This RN will pass this information to Day RN as well.

## 2020-04-30 NOTE — Progress Notes (Signed)
[facility] Progress Note   26M presenting with AKI in the setting of CKD 3, metabolic acidosis, mild hyperkalemia in setting of diarrhea,  ARB,  Colchicine w/ no e/o obstruction.    Assessment/ Plan:   1. AKI on CKD3 (BL 1.5, Sees [facility]) with AKI combination of hypovolemia from diarrhea and poor oral intake, ongoing ARB use. -Creatinine greatly improved down to 2.4. - Continue holding the ARB/HCTZ for now.  2. HTN on ARB/HCTZ as an outpt; would resume in the outpatient setting 3. Subacute diarrhea, management per primary 4. Anorexia, degree of weight loss currently unclear 5. Metabolic Acidosis, likely associated with AKI and diarrhea.  Now resolved 6. Hyperkalemia, resolved 7. Hx/o MGUS 8. Hx/o ETOH use, recent cessation 9. COPD/T user  Given the patient's greatly improved kidney function we will sign off at this time.  Subjective:   Patient states that he feels well today.  Urine output is increased per patient.  Drinking plenty water.   Objective:   BP 122/64 (BP Location: Left Arm)   Pulse 88   Temp 98.3 F (36.8 C) (Oral)   Resp 16   Ht 6\' 1"  (1.854 m)   Wt 99.9 kg   SpO2 97%   BMI 29.06 kg/m   Intake/Output Summary (Last 24 hours) at 04/30/2020 1043 Last data filed at 04/29/2020 1800 Gross per 24 hour  Intake 2647.96 ml  Output 400 ml  Net 2247.96 ml   Weight change:   Physical Exam: GEN:  Well appearing, lying in bed, no distress ENT: NCAT EYES: EOMI CV:  Normal rate PULM:  Present no increased work of breathing ABD: Bowel sounds present, soft, protuberant SKIN: No petechia or purpura EXT: No edema NEURO: AAO, no asterixis  Imaging: CT ABDOMEN PELVIS WO CONTRAST  Result Date: 04/28/2020 CLINICAL DATA:  Abdominal pain EXAM: CT ABDOMEN AND PELVIS WITHOUT CONTRAST TECHNIQUE: Multidetector CT imaging of the abdomen and pelvis was performed following the standard protocol without IV contrast. COMPARISON:  10/10/2019 FINDINGS: Lower  chest: No acute pleural or parenchymal lung disease. Hepatobiliary: No focal liver abnormality is seen. No gallstones, gallbladder wall thickening, or biliary dilatation. Pancreas: Mild fat stranding surrounding the head of the pancreas and duodenal sweep, which could reflect acute pancreatitis or duodenitis. Otherwise the pancreas is unremarkable on this limited unenhanced exam. Spleen: Normal in size without focal abnormality. Adrenals/Urinary Tract: No urinary tract calculi or obstructive uropathy. Multiple small bilateral renal cysts are again noted. The adrenals and bladder are unremarkable. Stomach/Bowel: No bowel obstruction or ileus. No bowel wall thickening. The appendix is surgically absent. Vascular/Lymphatic: Aortic atherosclerosis. No enlarged abdominal or pelvic lymph nodes. Reproductive: Prostate is unremarkable. Other: No free fluid or free gas. Fat containing bilateral inguinal hernias unchanged. Musculoskeletal: No acute or destructive bony lesions. Reconstructed images demonstrate no additional findings. IMPRESSION: 1. Fat stranding surrounding the pancreatic head and duodenal sweep, which could reflect pancreatitis or duodenitis. No fluid collection, pseudocyst, or abscess. 2. Otherwise no acute intra-abdominal or intrapelvic process. 3. Stable fat containing bilateral inguinal hernias. 4.  Aortic Atherosclerosis (ICD10-I70.0). Electronically Signed   By: Randa Ngo M.D.   On: 04/28/2020 16:42   CT Head Wo Contrast  Result Date: 04/28/2020 CLINICAL DATA:  Dizziness. EXAM: CT HEAD WITHOUT CONTRAST TECHNIQUE: Contiguous axial images were obtained from the base of the skull through the vertex without intravenous contrast. COMPARISON:  None. FINDINGS: Brain: No evidence of acute infarction, hemorrhage, hydrocephalus, extra-axial collection or mass lesion/mass effect. Vascular: No hyperdense vessel  or unexpected calcification. Skull: Normal. Negative for fracture or focal lesion.  Sinuses/Orbits: No acute finding. Other: None. IMPRESSION: No acute intracranial abnormality seen. Electronically Signed   By: Marijo Conception M.D.   On: 04/28/2020 16:36    Labs: BMET Recent Labs  Lab 04/28/20 1443 04/28/20 1949 04/29/20 0051 04/29/20 0733 04/30/20 0651  NA 131* 134* 135 136 135  K 5.9* 5.5* 5.2* 4.5 3.6  CL 102  --  106 107 103  CO2 9*  --  10* 14* 22  GLUCOSE 115*  --  106* 122* 128*  BUN 203*  --  193* 177* 134*  CREATININE 9.19*  --  6.72* 5.24* 2.39*  CALCIUM 8.8*  --  8.6* 8.4* 8.1*   CBC Recent Labs  Lab 04/28/20 1443 04/28/20 1949 04/29/20 0733  WBC 7.8  --  4.4  NEUTROABS 5.4  --   --   HGB 13.6 11.9* 11.6*  HCT 40.8 35.0* 33.5*  MCV 98.6  --  92.8  PLT 190  --  178    Medications:    . folic acid  1 mg Oral Daily  . heparin  5,000 Units Subcutaneous Q8H  . insulin aspart  0-5 Units Subcutaneous QHS  . insulin aspart  0-6 Units Subcutaneous TID WC  . multivitamin with minerals  1 tablet Oral Daily  . thiamine  100 mg Oral Daily      Reesa Chew  04/30/2020, 10:43 AM

## 2020-04-30 NOTE — Evaluation (Signed)
Occupational Therapy Evaluation/Discharge Patient Details Name: John Blake MRN: [id number] DOB: Jul 02, 1945 Today's Date: 04/30/2020    History of Present Illness 75 yo male presents to [facility] on 4/13 with weakness, fatigue, and diarrhea. Pt found to have AKI on CKD. PMH includes CKD stage IIIb, HTN, HLD, DM-2, MGUS, gout, depression, COPD, L RTC repair 2003.   Clinical Impression   PTA, pt lives with spouse and reports Independence with ADLs, IADLs and mobility without AD. Pt received finishing up sponge bath on OT entry with setup only for linens from nursing staff. Pt standing in room and able to mobilize without AD Independently in room. Pt endorses fatigue and weakness from frequent diarrhea but hopeful to regain strength soon. Due to chronic back pain, educated on compensatory strategies to minimize pain during ADLs. Also educated on energy conservation strategies to implement on initial return home including use of shower chair. Anticipate pt to return quickly to PLOF without need for further skilled OT services. OT to sign off at acute level.     Follow Up Recommendations  No OT follow up;Supervision - Intermittent    Equipment Recommendations  None recommended by OT    Recommendations for Other Services       Precautions / Restrictions Precautions Precautions: Fall;Other (comment) Precaution Comments: enteric Restrictions Weight Bearing Restrictions: No      Mobility Bed Mobility Overal bed mobility: Modified Independent Bed Mobility: Sit to Supine       Sit to supine: Modified independent (Device/Increase time)   General bed mobility comments: HOB slightly elevated. cued for log rolling to assist in minimizing chronic back pain    Transfers Overall transfer level: Independent Equipment used: None             General transfer comment: Independent standing in room mobilizing back to bed without AD    Balance Overall balance assessment: Needs  assistance Sitting-balance support: No upper extremity supported;Feet supported Sitting balance-Leahy Scale: Good     Standing balance support: No upper extremity supported;During functional activity Standing balance-Leahy Scale: Good                             ADL either performed or assessed with clinical judgement   ADL Overall ADL's : Modified independent                                       General ADL Comments: Entering as pt completing bath at sink after setup for linens only. Pt able to mobilize in room without AD at independent level though endorses weakness and fatigue due to frequent diarrhea     Vision Patient Visual Report: Other (comment) (recent difficulty with bright lights) Vision Assessment?: Vision impaired- to be further tested in functional context Additional Comments: vision changes with bright lights recently     Perception     Praxis      Pertinent Vitals/Pain Pain Assessment: Faces Faces Pain Scale: No hurt     Hand Dominance Right   Extremity/Trunk Assessment Upper Extremity Assessment Upper Extremity Assessment: Generalized weakness   Lower Extremity Assessment Lower Extremity Assessment: Defer to PT evaluation   Cervical / Trunk Assessment Cervical / Trunk Assessment: Normal   Communication Communication Communication: No difficulties   Cognition Arousal/Alertness: Awake/alert Behavior During Therapy: WFL for tasks assessed/performed;Flat affect Overall Cognitive Status: Within Functional Limits for tasks assessed  General Comments: Pt appears with depressed/flat affect but pleasant, participatory   General Comments  VSS on RA. Pt endorses fatigue and weakness with tasks. Educated on energy conservation strategies, including pacing, rest breaks and performing tasks seated prn. Encouraged use of shower chair at home    Exercises     Shoulder Instructions       Home Living Family/patient expects to be discharged to:: Private residence Living Arrangements: Spouse/significant other (pt states wife has health issues) Available Help at Discharge: Family;Available PRN/intermittently Type of Home: House Home Access: Stairs to enter [employer] of Steps: 5 Entrance Stairs-Rails: Right;Left Home Layout: One level     Bathroom Shower/Tub: Occupational psychologist: Standard     Home Equipment: Environmental consultant - 2 wheels;Cane - single point;Crutches;Shower seat          Prior Functioning/Environment Level of Independence: Independent        Comments: no use of AD. recently retired, Independent in all daily tasks        OT Problem List:        OT Treatment/Interventions:      OT Goals(Current goals can be found in the care plan section) Acute Rehab OT Goals Patient Stated Goal: regain strength OT Goal Formulation: All assessment and education complete, DC therapy  OT Frequency:     Barriers to D/C:            Co-evaluation              AM-PAC OT "6 Clicks" Daily Activity     Outcome Measure Help from another person eating meals?: None Help from another person taking care of personal grooming?: None Help from another person toileting, which includes using toliet, bedpan, or urinal?: None Help from another person bathing (including washing, rinsing, drying)?: None Help from another person to put on and taking off regular upper body clothing?: None Help from another person to put on and taking off regular lower body clothing?: None 6 Click Score: 24   End of Session Nurse Communication: Mobility status  Activity Tolerance: Patient limited by fatigue Patient left: in bed;with call bell/phone within reach  OT Visit Diagnosis: Muscle weakness (generalized) (M62.81)                Time: 0946-1000 OT Time Calculation (min): 14 min Charges:  OT General Charges $OT Visit: 1 Visit OT Evaluation $OT Eval Low  Complexity: 1 Low  John Blake, OTR/L Acute Rehab Services Office: [phone]  John Blake 04/30/2020, 10:09 AM

## 2020-05-01 DIAGNOSIS — N179 Acute kidney failure, unspecified: Secondary | ICD-10-CM | POA: Diagnosis not present

## 2020-05-01 DIAGNOSIS — R197 Diarrhea, unspecified: Secondary | ICD-10-CM | POA: Diagnosis not present

## 2020-05-01 DIAGNOSIS — N1832 Chronic kidney disease, stage 3b: Secondary | ICD-10-CM | POA: Diagnosis not present

## 2020-05-01 DIAGNOSIS — I10 Essential (primary) hypertension: Secondary | ICD-10-CM | POA: Diagnosis not present

## 2020-05-01 LAB — PROTEIN / CREATININE RATIO, URINE
Creatinine, Urine: 68.58 mg/dL
Protein Creatinine Ratio: 0.34 mg/mg{Cre} — ABNORMAL HIGH (ref 0.00–0.15)
Total Protein, Urine: 23 mg/dL

## 2020-05-01 LAB — URINALYSIS, ROUTINE W REFLEX MICROSCOPIC
Bilirubin Urine: NEGATIVE
Glucose, UA: NEGATIVE mg/dL
Ketones, ur: 5 mg/dL — AB
Leukocytes,Ua: NEGATIVE
Nitrite: NEGATIVE
Protein, ur: NEGATIVE mg/dL
Specific Gravity, Urine: 1.011 (ref 1.005–1.030)
pH: 5 (ref 5.0–8.0)

## 2020-05-01 LAB — GASTROINTESTINAL PANEL BY PCR, STOOL (REPLACES STOOL CULTURE)

## 2020-05-01 LAB — GLUCOSE, CAPILLARY
Glucose-Capillary: 84 mg/dL (ref 70–99)
Glucose-Capillary: 110 mg/dL — ABNORMAL HIGH (ref 70–99)
Glucose-Capillary: 137 mg/dL — ABNORMAL HIGH (ref 70–99)
Glucose-Capillary: 128 mg/dL — ABNORMAL HIGH (ref 70–99)

## 2020-05-01 LAB — CBC
HCT: 31 % — ABNORMAL LOW (ref 39.0–52.0)
Hemoglobin: 10.3 g/dL — ABNORMAL LOW (ref 13.0–17.0)
MCH: 31.7 pg (ref 26.0–34.0)
MCHC: 33.2 g/dL (ref 30.0–36.0)
MCV: 95.4 fL (ref 80.0–100.0)
Platelets: 141 10*3/uL — ABNORMAL LOW (ref 150–400)
RBC: 3.25 MIL/uL — ABNORMAL LOW (ref 4.22–5.81)
RDW: 13.2 % (ref 11.5–15.5)
WBC: 4.9 10*3/uL (ref 4.0–10.5)
nRBC: 0 % (ref 0.0–0.2)

## 2020-05-01 LAB — BASIC METABOLIC PANEL
Anion gap: 13 (ref 5–15)
BUN: 96 mg/dL — ABNORMAL HIGH (ref 8–23)
CO2: 22 mmol/L (ref 22–32)
Calcium: 8.4 mg/dL — ABNORMAL LOW (ref 8.9–10.3)
Chloride: 102 mmol/L (ref 98–111)
Creatinine, Ser: 2.03 mg/dL — ABNORMAL HIGH (ref 0.61–1.24)
GFR, Estimated: 34 mL/min — ABNORMAL LOW (ref >60)
Glucose, Bld: 94 mg/dL (ref 70–99)
Potassium: 3.6 mmol/L (ref 3.5–5.1)
Sodium: 137 mmol/L (ref 135–145)

## 2020-05-01 MED ORDER — PANTOPRAZOLE SODIUM 40 MG PO TBEC
40.0000 mg | DELAYED_RELEASE_TABLET | Freq: Two times a day (BID) | ORAL | Status: DC
  Administered 2020-05-01 – 2020-05-02 (×2): 40 mg via ORAL
  Filled 2020-05-01 (×2): qty 1

## 2020-05-01 NOTE — Progress Notes (Signed)
PROGRESS NOTE        PATIENT DETAILS Name: John Blake Age: 75 y.o. Sex: male Date of Birth: 1945/07/30 Admit Date: 04/28/2020 Admitting Physician Allie Dimmer, MD IRJ:JOACZY, Delfino Lovett, NP  Brief Narrative: Patient is a 75 y.o. male with history of CKD stage IIIb, HTN, HLD, DM-2, MGUS, gout-who presented to the hospital with weakness and fatigue-apparently was been having intermittent diarrhea for the past several weeks as well-upon further evaluation-patient was found to have AKI, hyperkalemia-he was subsequently admitted to the hospitalist service.  See below for further details.  Significant events: 4/13>> admit for evaluation of AKI  Significant studies: 4/13>> CT head: No acute abnormalities 4/13>> CT abdomen/pelvis: No fluid collection, pseudocyst or abscess.  Fat stranding around the pancreatic head/duodenum.  Antimicrobial therapy: None  Microbiology data: 4/14>> blood culture: No growth  Procedures : None  Consults: Nephrology  DVT Prophylaxis : heparin injection 5,000 Units Start: 04/28/20 2200   Subjective: Feels better-no diarrhea since yesterday-around 12 noon.   Assessment/Plan: AKI on CKD stage IIIb: AKI hemodynamically mediated in the setting of diarrhea/poor oral intake/ARB use.  Renal function continues to improve-continue to hold ARB/HCTZ.  Continue to avoid nephrotoxic agents-decrease IV fluids further.  Hyperkalemia: Resolved with bicarbonate infusion and Lokelma.  Anion gap metabolic acidosis: Due to AKI-improved.  Will switch to Ringer's lactate-stop IV fluids with bicarb.  Diarrhea: No further diarrhea since noon 4/15-C. difficile antigen positive-but since diarrhea has resolved-doubt this needs to be treated.  Patient was also on colchicine in the outpatient setting.  Await GI pathogen panel.    HTN: BP stable-monitor off antihypertensives.  DM-2 (A1c 6.6 on 4/14): CBG stable with SSI  Recent Labs     04/30/20 1953 05/01/20 0720 05/01/20 1223  GLUCAP 123* 84 110*    COPD: No exacerbation-continue with as needed bronchodilators  History of MGUS: Follow SPEP/UPEP  History of EtOH use: No signs of withdrawal-watch closely   Diet: Diet Order            Diet Carb Modified Fluid consistency: Thin; Room service appropriate? Yes  Diet effective now                  Code Status: Full code   Family Communication: Spouse-Judy [phone] over phone 4/15-no response on 4/16.  Disposition Plan: Status is: Inpatient  Remains inpatient appropriate because:Inpatient level of care appropriate due to severity of illness   Dispo: The patient is from: Home              Anticipated d/c is to: Home              Patient currently is not medically stable to d/c.   Difficult to place patient No   Barriers to Discharge: AKI/hyperkalemia-on IV fluids-not yet stable for discharge  Antimicrobial agents: Anti-infectives (From admission, onward)   None       Time spent: 25 minutes-Greater than 50% of this time was spent in counseling, explanation of diagnosis, planning of further management, and coordination of care.  MEDICATIONS: Scheduled Meds: . folic acid  1 mg Oral Daily  . heparin  5,000 Units Subcutaneous Q8H  . insulin aspart  0-5 Units Subcutaneous QHS  . insulin aspart  0-6 Units Subcutaneous TID WC  . multivitamin with minerals  1 tablet Oral Daily  . pantoprazole  40 mg Oral BID  .  thiamine  100 mg Oral Daily   Continuous Infusions: . lactated ringers 75 mL/hr at 05/01/20 1018   PRN Meds:.acetaminophen **OR** acetaminophen, albuterol, HYDROcodone-acetaminophen, ondansetron **OR** ondansetron (ZOFRAN) IV   PHYSICAL EXAM: Vital signs: Vitals:   05/01/20 0000 05/01/20 0344 05/01/20 0716 05/01/20 1220  BP: 116/72 138/68 113/68 120/65  Pulse: 70 87 81 81  Resp: 16 15 16 18   Temp: 97.9 F (36.6 C) 97.8 F (36.6 C) 97.7 F (36.5 C) 97.8 F  (36.6 C)  TempSrc: Axillary Axillary Oral Oral  SpO2: 96% 96% 96% 98%  Weight:      Height:       Filed Weights   04/28/20 2200  Weight: 99.9 kg   Body mass index is 29.06 kg/m.   Gen Exam:Alert awake-not in any distress HEENT:atraumatic, normocephalic Chest: B/L clear to auscultation anteriorly CVS:S1S2 regular Abdomen:soft non tender, non distended Extremities:no edema Neurology: Non focal Skin: no rash  I have personally reviewed following labs and imaging studies  LABORATORY DATA: CBC: Recent Labs  Lab 04/28/20 1443 04/28/20 1949 04/29/20 0733 04/30/20 0651 05/01/20 0731  WBC 7.8  --  4.4 5.4 4.9  NEUTROABS 5.4  --   --   --   --   HGB 13.6 11.9* 11.6* 10.9* 10.3*  HCT 40.8 35.0* 33.5* 31.4* 31.0*  MCV 98.6  --  92.8 93.7 95.4  PLT 190  --  178 161 141*    Basic Metabolic Panel: Recent Labs  Lab 04/28/20 1443 04/28/20 1949 04/29/20 0051 04/29/20 0733 04/30/20 0651 05/01/20 0731  NA 131* 134* 135 136 135 137  K 5.9* 5.5* 5.2* 4.5 3.6 3.6  CL 102  --  106 107 103 102  CO2 9*  --  10* 14* 22 22  GLUCOSE 115*  --  106* 122* 128* 94  BUN 203*  --  193* 177* 134* 96*  CREATININE 9.19*  --  6.72* 5.24* 2.39* 2.03*  CALCIUM 8.8*  --  8.6* 8.4* 8.1* 8.4*  MG 2.7*  --   --   --   --   --     GFR: Estimated Creatinine Clearance: 40.9 mL/min (A) (by C-G formula based on SCr of 2.03 mg/dL (H)).  Liver Function Tests: Recent Labs  Lab 04/28/20 1443 04/29/20 0733  AST 8* 7*  ALT 10 8  ALKPHOS 42 34*  BILITOT 0.6 0.6  PROT 6.6 5.8*  ALBUMIN 3.6 3.2*   Recent Labs  Lab 04/29/20 0051  LIPASE 199*   No results for input(s): AMMONIA in the last 168 hours.  Coagulation Profile: No results for input(s): INR, PROTIME in the last 168 hours.  Cardiac Enzymes: No results for input(s): CKTOTAL, CKMB, CKMBINDEX, TROPONINI in the last 168 hours.  BNP (last 3 results) No results for input(s): PROBNP in the last 8760 hours.  Lipid Profile: No results  for input(s): CHOL, HDL, LDLCALC, TRIG, CHOLHDL, LDLDIRECT in the last 72 hours.  Thyroid Function Tests: No results for input(s): TSH, T4TOTAL, FREET4, T3FREE, THYROIDAB in the last 72 hours.  Anemia Panel: No results for input(s): VITAMINB12, FOLATE, FERRITIN, TIBC, IRON, RETICCTPCT in the last 72 hours.  Urine analysis:    Component Value Date/Time   COLORURINE YELLOW 05/01/2020 0226   APPEARANCEUR CLEAR 05/01/2020 0226   LABSPEC 1.011 05/01/2020 0226   PHURINE 5.0 05/01/2020 0226   GLUCOSEU NEGATIVE 05/01/2020 0226   HGBUR SMALL (A) 05/01/2020 0226   BILIRUBINUR NEGATIVE 05/01/2020 0226   BILIRUBINUR negative 01/05/2017 1519   KETONESUR  5 (A) 05/01/2020 0226   PROTEINUR NEGATIVE 05/01/2020 0226   UROBILINOGEN 0.2 01/05/2017 1519   NITRITE NEGATIVE 05/01/2020 0226   LEUKOCYTESUR NEGATIVE 05/01/2020 0226    Sepsis Labs: Lactic Acid, Venous    Component Value Date/Time   LATICACIDVEN 1.3 04/29/2020 0051    MICROBIOLOGY: Recent Results (from the past 240 hour(s))  Culture, blood (routine x 2)     Status: None (Preliminary result)   Collection Time: 04/28/20  6:00 PM   Specimen: BLOOD LEFT ARM  Result Value Ref Range Status   Specimen Description BLOOD LEFT ARM  Final   Special Requests   Final    BOTTLES DRAWN AEROBIC AND ANAEROBIC Blood Culture adequate volume   Culture   Final    NO GROWTH 3 DAYS Performed at [facility], [place] [address], [place], [place] [address]    Report Status PENDING  Incomplete  SARS CORONAVIRUS 2 (TAT 6-24 HRS) Nasopharyngeal Nasopharyngeal Swab     Status: None   Collection Time: 04/28/20  7:21 PM   Specimen: Nasopharyngeal Swab  Result Value Ref Range Status   SARS Coronavirus 2 NEGATIVE NEGATIVE Final    Comment: (NOTE) SARS-CoV-2 target nucleic acids are NOT DETECTED.  The SARS-CoV-2 RNA is generally detectable in upper and lower respiratory specimens during the acute phase of infection. Negative results do not preclude  SARS-CoV-2 infection, do not rule out co-infections with other pathogens, and should not be used as the sole basis for treatment or other patient management decisions. Negative results must be combined with clinical observations, patient history, and epidemiological information. The expected result is Negative.  Fact Sheet for Patients: SugarRoll.be  Fact Sheet for Healthcare Providers: https://www.woods-mathews.com/  This test is not yet approved or cleared by the [place] FDA and  has been authorized for detection and/or diagnosis of SARS-CoV-2 by FDA under an Emergency Use Authorization (EUA). This EUA will remain  in effect (meaning this test can be used) for the duration of the COVID-19 declaration under Se ction 564(b)(1) of the Act, 21 U.S.C. section 360bbb-3(b)(1), unless the authorization is terminated or revoked sooner.  Performed at [facility], [place] [address] [place], [place] [address]   Culture, blood (routine x 2)     Status: None (Preliminary result)   Collection Time: 04/29/20 12:51 AM   Specimen: BLOOD LEFT HAND  Result Value Ref Range Status   Specimen Description BLOOD LEFT HAND  Final   Special Requests   Final    BOTTLES DRAWN AEROBIC AND ANAEROBIC Blood Culture adequate volume   Culture   Final    NO GROWTH 2 DAYS Performed at [facility], [place] [address], [place], [place] [address]    Report Status PENDING  Incomplete  C Difficile Quick Screen w PCR reflex     Status: Abnormal   Collection Time: 04/30/20 12:50 AM   Specimen: STOOL  Result Value Ref Range Status   C Diff antigen POSITIVE (A) NEGATIVE Final   C Diff toxin NEGATIVE NEGATIVE Final   C Diff interpretation Results are indeterminate. See PCR results.  Final    Comment: Performed at [facility], [place] [address], [place],  [address]  C. Diff by PCR, Reflexed     Status: None   Collection Time: 04/30/20 12:50 AM  Result  Value Ref Range Status   Toxigenic C. Difficile by PCR NEGATIVE NEGATIVE Final    Comment: Patient is colonized with non toxigenic C. difficile.  May not need treatment unless significant symptoms are present. Performed at [facility], [place] [address], [place],  [address]     RADIOLOGY STUDIES/RESULTS: No results found.   LOS: 3 days   Oren Binet, MD  [facility]    To contact the attending provider between 7A-7P or the covering provider during after hours 7P-7A, please log into the web site [email or web address] and access using universal Early password for that web site. If you do not have the password, please call the hospital operator.  05/01/2020, 1:22 PM

## 2020-05-02 DIAGNOSIS — N179 Acute kidney failure, unspecified: Secondary | ICD-10-CM | POA: Diagnosis not present

## 2020-05-02 DIAGNOSIS — I10 Essential (primary) hypertension: Secondary | ICD-10-CM | POA: Diagnosis not present

## 2020-05-02 DIAGNOSIS — R197 Diarrhea, unspecified: Secondary | ICD-10-CM | POA: Diagnosis not present

## 2020-05-02 DIAGNOSIS — N1832 Chronic kidney disease, stage 3b: Secondary | ICD-10-CM | POA: Diagnosis not present

## 2020-05-02 LAB — BASIC METABOLIC PANEL
Anion gap: 9 (ref 5–15)
BUN: 66 mg/dL — ABNORMAL HIGH (ref 8–23)
CO2: 26 mmol/L (ref 22–32)
Calcium: 8.3 mg/dL — ABNORMAL LOW (ref 8.9–10.3)
Chloride: 102 mmol/L (ref 98–111)
Creatinine, Ser: 1.71 mg/dL — ABNORMAL HIGH (ref 0.61–1.24)
GFR, Estimated: 42 mL/min — ABNORMAL LOW (ref >60)
Glucose, Bld: 108 mg/dL — ABNORMAL HIGH (ref 70–99)
Potassium: 3.7 mmol/L (ref 3.5–5.1)
Sodium: 137 mmol/L (ref 135–145)

## 2020-05-02 LAB — CBC
HCT: 31 % — ABNORMAL LOW (ref 39.0–52.0)
Hemoglobin: 10.4 g/dL — ABNORMAL LOW (ref 13.0–17.0)
MCH: 32 pg (ref 26.0–34.0)
MCHC: 33.5 g/dL (ref 30.0–36.0)
MCV: 95.4 fL (ref 80.0–100.0)
Platelets: 142 10*3/uL — ABNORMAL LOW (ref 150–400)
RBC: 3.25 MIL/uL — ABNORMAL LOW (ref 4.22–5.81)
RDW: 13 % (ref 11.5–15.5)
WBC: 5 10*3/uL (ref 4.0–10.5)
nRBC: 0 % (ref 0.0–0.2)

## 2020-05-02 LAB — GLUCOSE, CAPILLARY
Glucose-Capillary: 97 mg/dL (ref 70–99)
Glucose-Capillary: 117 mg/dL — ABNORMAL HIGH (ref 70–99)

## 2020-05-02 MED ORDER — AMLODIPINE BESYLATE 5 MG PO TABS: 5.0000 mg | ORAL_TABLET | Freq: Every day | ORAL | 11 refills | Status: DC

## 2020-05-02 NOTE — Progress Notes (Signed)
Nsg Discharge Note  Admit Date:  04/28/2020 Discharge date: 05/02/2020   [place] to be D/C'd   AVS completed.  Patient/caregiver able to verbalize understanding.  Discharge Medication: Allergies as of 05/02/2020      Reactions   Nsaids    Kidney disease      Medication List    STOP taking these medications   colchicine 0.6 MG tablet     TAKE these medications   acetaminophen 325 MG tablet Commonly known as: TYLENOL Take 325 mg by mouth 2 (two) times daily.   albuterol 108 (90 Base) MCG/ACT inhaler Commonly known as: VENTOLIN HFA TAKE 2 PUFFS BY MOUTH EVERY 6 HOURS AS NEEDED FOR WHEEZE OR SHORTNESS OF BREATH What changed: See the new instructions.   allopurinol 100 MG tablet Commonly known as: ZYLOPRIM Take 1 tablet (100 mg total) by mouth daily. What changed:   when to take this  reasons to take this   amLODipine 5 MG tablet Commonly known as: NORVASC Take 1 tablet (5 mg total) by mouth daily.       Discharge Assessment: Vitals:   05/02/20 0741 05/02/20 1200  BP: (!) 143/94 131/72  Pulse: 98 100  Resp: 18 19  Temp: 98.3 F (36.8 C) 98.5 F (36.9 C)  SpO2: 96% 99%   Skin clean, dry and intact without evidence of skin break down, no evidence of skin tears noted. IV catheter discontinued intact. Site without signs and symptoms of complications - no redness or edema noted at insertion site, patient denies c/o pain - only slight tenderness at site.  Dressing with slight pressure applied.  D/c Instructions-Education: Discharge instructions given to patient/family with verbalized understanding. D/c education completed with patient/family including follow up instructions, medication list, d/c activities limitations if indicated, with other d/c instructions as indicated by MD - patient able to verbalize understanding, all questions fully answered. Patient instructed to return to ED, call 911, or call MD for any changes in condition.  Patient escorted via [place],  and D/C home via private auto.  Alizae Bechtel, Jolene Schimke, RN 05/02/2020 3:31 PM

## 2020-05-02 NOTE — Discharge Summary (Signed)
PATIENT DETAILS Name: John Blake Age: 75 y.o. Sex: male Date of Birth: [birth year] MRN: [id number]. Admitting Physician: Allie Dimmer, MD JQB:HALPFX, Delfino Lovett, NP  Admit Date: 04/28/2020 Discharge date: 05/02/2020  Recommendations for Outpatient Follow-up:  1. Follow up with PCP in 1-2 weeks 2. Please obtain CMP/CBC in one week 3. Adjust antihypertensive medications  Admitted From:  Home    Disposition: Home with home health services  [place]: Yes  Equipment/Devices: None  Discharge Condition: Stable  CODE STATUS: FULL CODE  Diet recommendation:  Diet Order            Diet - low sodium heart healthy           Diet Carb Modified           Diet Carb Modified Fluid consistency: Thin; Room service appropriate? Yes  Diet effective now                  Brief Narrative: Patient is a 75 y.o. male with history of CKD stage IIIb, HTN, HLD, DM-2, MGUS, gout-who presented to the hospital with weakness and fatigue-apparently was been having intermittent diarrhea for the past several weeks as well-upon further evaluation-patient was found to have AKI, hyperkalemia-he was subsequently admitted to the hospitalist service.  See below for further details.  Significant events: 4/13>> admit for evaluation of AKI  Significant studies: 4/13>> CT head: No acute abnormalities 4/13>> CT abdomen/pelvis: No fluid collection, pseudocyst or abscess.  Fat stranding around the pancreatic head/duodenum.  Antimicrobial therapy: None  Microbiology data: 4/14>> blood culture: No growth  Procedures : None  Consults: Nephrology  Brief Hospital Course: AKI on CKD stage IIIb: AKI hemodynamically mediated in the setting of diarrhea/poor oral intake/ARB use.  Renal function continues to improve-continue to hold ARB/HCTZ.  Please repeat electrolyte panel in 1 week.  Hyperkalemia: Resolved with bicarbonate infusion and Lokelma.  Anion gap metabolic acidosis: Due to  AKI-resolved-required bicarb infusion.  Diarrhea: No further diarrhea since noon 4/15-C. difficile antigen positive-but since diarrhea has resolved-doubt this needs to be treated-as does not appear that he has any active C. difficile infection.  And GI pathogen panel was negative.  Patient apparently was on colchicine in the outpatient setting-this could have triggered diarrhea.  HTN: BP stable-but creeping up-we will resume amlodipine on discharge.  Further optimization deferred to PCP.  DM-2 (A1c 6.6 on 4/14): CBG stable with SSI-follow with PCP for further treatment.  COPD: No exacerbation-continue with as needed bronchodilators  History of MGUS: Follow SPEP/UPEP  History of EtOH use: No signs of withdrawal-he was monitored closely.   Discharge Diagnoses:  Principal Problem:   AKI (acute kidney injury) ([place]) Active Problems:   Type 2 diabetes mellitus with hyperglycemia, without long-term current use of insulin (HCC)   Essential hypertension   Hyperlipidemia   CKD (chronic kidney disease) stage 3, GFR 30-59 ml/min (HCC)   COPD (chronic obstructive pulmonary disease) (HCC)   Metabolic acidosis   Discharge Instructions:  Activity:  As tolerated   Discharge Instructions    Call MD for:  persistant nausea and vomiting   Complete by: As directed    Call MD for:  severe uncontrolled pain   Complete by: As directed    Diet - low sodium heart healthy   Complete by: As directed    Diet Carb Modified   Complete by: As directed    Discharge instructions   Complete by: As directed    Follow with Primary MD  Maximiano Coss,  NP in 1-2 weeks  Please get a complete blood count and chemistry panel checked by your Primary MD at your next visit, and again as instructed by your Primary MD.  Get Medicines reviewed and adjusted: Please take all your medications with you for your next visit with your Primary MD  Laboratory/radiological data: Please request your Primary MD to go  over all hospital tests and procedure/radiological results at the follow up, please ask your Primary MD to get all Hospital records sent to his/her office.  In some cases, they will be blood work, cultures and biopsy results pending at the time of your discharge. Please request that your primary care M.D. follows up on these results.  Also Note the following: If you experience worsening of your admission symptoms, develop shortness of breath, life threatening emergency, suicidal or homicidal thoughts you must seek medical attention immediately by calling 911 or calling your MD immediately  if symptoms less severe.  You must read complete instructions/literature along with all the possible adverse reactions/side effects for all the Medicines you take and that have been prescribed to you. Take any new Medicines after you have completely understood and accpet all the possible adverse reactions/side effects.   Do not drive when taking Pain medications or sleeping medications (Benzodaizepines)  Do not take more than prescribed Pain, Sleep and Anxiety Medications. It is not advisable to combine anxiety,sleep and pain medications without talking with your primary care practitioner  Special Instructions: If you have smoked or chewed Tobacco  in the last 2 yrs please stop smoking, stop any regular Alcohol  and or any Recreational drug use.  Wear Seat belts while driving.  Please note: You were cared for by a hospitalist during your hospital stay. Once you are discharged, your primary care physician will handle any further medical issues. Please note that NO REFILLS for any discharge medications will be authorized once you are discharged, as it is imperative that you return to your primary care physician (or establish a relationship with a primary care physician if you do not have one) for your post hospital discharge needs so that they can reassess your need for medications and monitor your lab values.    Increase activity slowly   Complete by: As directed      Allergies as of 05/02/2020      Reactions   Nsaids    Kidney disease      Medication List    STOP taking these medications   colchicine 0.6 MG tablet     TAKE these medications   acetaminophen 325 MG tablet Commonly known as: TYLENOL Take 325 mg by mouth 2 (two) times daily.   albuterol 108 (90 Base) MCG/ACT inhaler Commonly known as: VENTOLIN HFA TAKE 2 PUFFS BY MOUTH EVERY 6 HOURS AS NEEDED FOR WHEEZE OR SHORTNESS OF BREATH What changed: See the new instructions.   allopurinol 100 MG tablet Commonly known as: ZYLOPRIM Take 1 tablet (100 mg total) by mouth daily. What changed:   when to take this  reasons to take this   amLODipine 5 MG tablet Commonly known as: NORVASC Take 1 tablet (5 mg total) by mouth daily.       Follow-up Information    Maximiano Coss, NP. Schedule an appointment as soon as possible for a visit in 1 week(s).   Specialty: Adult Health Nurse Practitioner Contact information: [place] [address] [phone]              Allergies  Allergen Reactions  . Nsaids     Kidney disease    Other Procedures/Studies: CT ABDOMEN PELVIS WO CONTRAST  Result Date: 04/28/2020 CLINICAL DATA:  Abdominal pain EXAM: CT ABDOMEN AND PELVIS WITHOUT CONTRAST TECHNIQUE: Multidetector CT imaging of the abdomen and pelvis was performed following the standard protocol without IV contrast. COMPARISON:  10/10/2019 FINDINGS: Lower chest: No acute pleural or parenchymal lung disease. Hepatobiliary: No focal liver abnormality is seen. No gallstones, gallbladder wall thickening, or biliary dilatation. Pancreas: Mild fat stranding surrounding the head of the pancreas and duodenal sweep, which could reflect acute pancreatitis or duodenitis. Otherwise the pancreas is unremarkable on this limited unenhanced exam. Spleen: Normal in size without focal abnormality. Adrenals/Urinary Tract: No urinary  tract calculi or obstructive uropathy. Multiple small bilateral renal cysts are again noted. The adrenals and bladder are unremarkable. Stomach/Bowel: No bowel obstruction or ileus. No bowel wall thickening. The appendix is surgically absent. Vascular/Lymphatic: Aortic atherosclerosis. No enlarged abdominal or pelvic lymph nodes. Reproductive: Prostate is unremarkable. Other: No free fluid or free gas. Fat containing bilateral inguinal hernias unchanged. Musculoskeletal: No acute or destructive bony lesions. Reconstructed images demonstrate no additional findings. IMPRESSION: 1. Fat stranding surrounding the pancreatic head and duodenal sweep, which could reflect pancreatitis or duodenitis. No fluid collection, pseudocyst, or abscess. 2. Otherwise no acute intra-abdominal or intrapelvic process. 3. Stable fat containing bilateral inguinal hernias. 4.  Aortic Atherosclerosis (ICD10-I70.0). Electronically Signed   By: Randa Ngo M.D.   On: 04/28/2020 16:42   CT Head Wo Contrast  Result Date: 04/28/2020 CLINICAL DATA:  Dizziness. EXAM: CT HEAD WITHOUT CONTRAST TECHNIQUE: Contiguous axial images were obtained from the base of the skull through the vertex without intravenous contrast. COMPARISON:  None. FINDINGS: Brain: No evidence of acute infarction, hemorrhage, hydrocephalus, extra-axial collection or mass lesion/mass effect. Vascular: No hyperdense vessel or unexpected calcification. Skull: Normal. Negative for fracture or focal lesion. Sinuses/Orbits: No acute finding. Other: None. IMPRESSION: No acute intracranial abnormality seen. Electronically Signed   By: Marijo Conception M.D.   On: 04/28/2020 16:36     TODAY-DAY OF DISCHARGE:  Subjective:   Wynonia Musty today has no headache,no chest abdominal pain,no new weakness tingling or numbness, feels much better wants to go home today.  Objective:   Blood pressure (!) 143/94, pulse 98, temperature 98.3 F (36.8 C), temperature source Oral, resp.  rate 18, height 6\' 1"  (1.854 m), weight 99.9 kg, SpO2 96 %.  Intake/Output Summary (Last 24 hours) at 05/02/2020 0942 Last data filed at 05/01/2020 2130 Gross per 24 hour  Intake 1850.84 ml  Output 800 ml  Net 1050.84 ml   Filed Weights   04/28/20 2200  Weight: 99.9 kg    Exam: Awake Alert, Oriented *3, No new F.N deficits, Normal affect [place].AT,PERRAL Supple Neck,No JVD, No cervical lymphadenopathy appriciated.  Symmetrical Chest wall movement, Good air movement bilaterally, CTAB RRR,No Gallops,Rubs or new Murmurs, No Parasternal Heave +ve B.Sounds, Abd Soft, Non tender, No organomegaly appriciated, No rebound -guarding or rigidity. No Cyanosis, Clubbing or edema, No new Rash or bruise   PERTINENT RADIOLOGIC STUDIES: No results found.   PERTINENT LAB RESULTS: CBC: Recent Labs    05/01/20 0731 05/02/20 0219  WBC 4.9 5.0  HGB 10.3* 10.4*  HCT 31.0* 31.0*  PLT 141* 142*   CMET CMP     Component Value Date/Time   NA 137 05/02/2020 0219   NA 139 08/12/2019 1657   K 3.7 05/02/2020 0219   CL 102 05/02/2020 0219  CO2 26 05/02/2020 0219   GLUCOSE 108 (H) 05/02/2020 0219   BUN 66 (H) 05/02/2020 0219   BUN 30 (H) 08/12/2019 1657   CREATININE 1.71 (H) 05/02/2020 0219   CREATININE 1.29 (H) 01/18/2015 1418   CALCIUM 8.3 (L) 05/02/2020 0219   PROT 5.8 (L) 04/29/2020 0733   PROT 7.1 08/12/2019 1657   ALBUMIN 3.2 (L) 04/29/2020 0733   ALBUMIN 4.4 08/12/2019 1657   AST 7 (L) 04/29/2020 0733   ALT 8 04/29/2020 0733   ALKPHOS 34 (L) 04/29/2020 0733   BILITOT 0.6 04/29/2020 0733   BILITOT 0.3 08/12/2019 1657   GFRNONAA 42 (L) 05/02/2020 0219   GFRAA 51 (L) 08/12/2019 1657    GFR Estimated Creatinine Clearance: 48.5 mL/min (A) (by C-G formula based on SCr of 1.71 mg/dL (H)). No results for input(s): LIPASE, AMYLASE in the last 72 hours. No results for input(s): CKTOTAL, CKMB, CKMBINDEX, TROPONINI in the last 72 hours. Invalid input(s): POCBNP No results for input(s):  DDIMER in the last 72 hours. No results for input(s): HGBA1C in the last 72 hours. No results for input(s): CHOL, HDL, LDLCALC, TRIG, CHOLHDL, LDLDIRECT in the last 72 hours. No results for input(s): TSH, T4TOTAL, T3FREE, THYROIDAB in the last 72 hours.  Invalid input(s): FREET3 No results for input(s): VITAMINB12, FOLATE, FERRITIN, TIBC, IRON, RETICCTPCT in the last 72 hours. Coags: No results for input(s): INR in the last 72 hours.  Invalid input(s): PT Microbiology: Recent Results (from the past 240 hour(s))  Culture, blood (routine x 2)     Status: None (Preliminary result)   Collection Time: 04/28/20  6:00 PM   Specimen: BLOOD LEFT ARM  Result Value Ref Range Status   Specimen Description BLOOD LEFT ARM  Final   Special Requests   Final    BOTTLES DRAWN AEROBIC AND ANAEROBIC Blood Culture adequate volume   Culture   Final    NO GROWTH 3 DAYS Performed at [facility], [address] [place], [place] [address]    Report Status PENDING  Incomplete  SARS CORONAVIRUS 2 (TAT 6-24 HRS) Nasopharyngeal Nasopharyngeal Swab     Status: None   Collection Time: 04/28/20  7:21 PM   Specimen: Nasopharyngeal Swab  Result Value Ref Range Status   SARS Coronavirus 2 NEGATIVE NEGATIVE Final    Comment: (NOTE) SARS-CoV-2 target nucleic acids are NOT DETECTED.  The SARS-CoV-2 RNA is generally detectable in upper and lower respiratory specimens during the acute phase of infection. Negative results do not preclude SARS-CoV-2 infection, do not rule out co-infections with other pathogens, and should not be used as the sole basis for treatment or other patient management decisions. Negative results must be combined with clinical observations, patient history, and epidemiological information. The expected result is Negative.  Fact Sheet for Patients: SugarRoll.be  Fact Sheet for Healthcare Providers: https://www.woods-mathews.com/  This test  is not yet approved or cleared by the [place] FDA and  has been authorized for detection and/or diagnosis of SARS-CoV-2 by FDA under an Emergency Use Authorization (EUA). This EUA will remain  in effect (meaning this test can be used) for the duration of the COVID-19 declaration under Se ction 564(b)(1) of the Act, 21 U.S.C. section 360bbb-3(b)(1), unless the authorization is terminated or revoked sooner.  Performed at [facility], [place] [address] [place], [place] [address]   Culture, blood (routine x 2)     Status: None (Preliminary result)   Collection Time: 04/29/20 12:51 AM   Specimen:  BLOOD LEFT HAND  Result Value Ref Range Status   Specimen Description BLOOD LEFT HAND  Final   Special Requests   Final    BOTTLES DRAWN AEROBIC AND ANAEROBIC Blood Culture adequate volume   Culture   Final    NO GROWTH 2 DAYS Performed at [facility], [address], [place], [place] [address]    Report Status PENDING  Incomplete  C Difficile Quick Screen w PCR reflex     Status: Abnormal   Collection Time: 04/30/20 12:50 AM   Specimen: STOOL  Result Value Ref Range Status   C Diff antigen POSITIVE (A) NEGATIVE Final   C Diff toxin NEGATIVE NEGATIVE Final   C Diff interpretation Results are indeterminate. See PCR results.  Final    Comment: Performed at [facility], [place] [address] [place], [place] [address]  C. Diff by PCR, Reflexed     Status: None   Collection Time: 04/30/20 12:50 AM  Result Value Ref Range Status   Toxigenic C. Difficile by PCR NEGATIVE NEGATIVE Final    Comment: Patient is colonized with non toxigenic C. difficile. May not need treatment unless significant symptoms are present. Performed at [facility], [place] [address], [place], [place] [address]   Gastrointestinal Panel by PCR , Stool     Status: None   Collection Time: 04/30/20  8:53 AM   Specimen: Stool  Result Value Ref Range Status   Campylobacter species NOT DETECTED NOT  DETECTED Final   Plesimonas shigelloides NOT DETECTED NOT DETECTED Final   Salmonella species NOT DETECTED NOT DETECTED Final   Yersinia enterocolitica NOT DETECTED NOT DETECTED Final   Vibrio species NOT DETECTED NOT DETECTED Final   Vibrio cholerae NOT DETECTED NOT DETECTED Final   Enteroaggregative E coli (EAEC) NOT DETECTED NOT DETECTED Final   Enteropathogenic E coli (EPEC) NOT DETECTED NOT DETECTED Final   Enterotoxigenic E coli (ETEC) NOT DETECTED NOT DETECTED Final   Shiga like toxin producing E coli (STEC) NOT DETECTED NOT DETECTED Final   Shigella/Enteroinvasive E coli (EIEC) NOT DETECTED NOT DETECTED Final   Cryptosporidium NOT DETECTED NOT DETECTED Final   Cyclospora cayetanensis NOT DETECTED NOT DETECTED Final   Entamoeba histolytica NOT DETECTED NOT DETECTED Final   Giardia lamblia NOT DETECTED NOT DETECTED Final   Adenovirus F40/41 NOT DETECTED NOT DETECTED Final   Astrovirus NOT DETECTED NOT DETECTED Final   Norovirus GI/GII NOT DETECTED NOT DETECTED Final   Rotavirus A NOT DETECTED NOT DETECTED Final   Sapovirus (I, II, IV, and V) NOT DETECTED NOT DETECTED Final    Comment: Performed at [facility], [address] [place], [place] [address]    FURTHER DISCHARGE INSTRUCTIONS:  Get Medicines reviewed and adjusted: Please take all your medications with you for your next visit with your Primary MD  Laboratory/radiological data: Please request your Primary MD to go over all hospital tests and procedure/radiological results at the follow up, please ask your Primary MD to get all Hospital records sent to his/her office.  In some cases, they will be blood work, cultures and biopsy results pending at the time of your discharge. Please request that your primary care M.D. goes through all the records of your hospital data and follows up on these results.  Also Note the following: If you experience worsening of your admission symptoms, develop shortness of breath,  life threatening emergency, suicidal or homicidal thoughts you must seek medical attention immediately by calling 911  or calling your MD immediately  if symptoms less severe.  You must read complete instructions/literature along with all the possible adverse reactions/side effects for all the Medicines you take and that have been prescribed to you. Take any new Medicines after you have completely understood and accpet all the possible adverse reactions/side effects.   Do not drive when taking Pain medications or sleeping medications (Benzodaizepines)  Do not take more than prescribed Pain, Sleep and Anxiety Medications. It is not advisable to combine anxiety,sleep and pain medications without talking with your primary care practitioner  Special Instructions: If you have smoked or chewed Tobacco  in the last 2 yrs please stop smoking, stop any regular Alcohol  and or any Recreational drug use.  Wear Seat belts while driving.  Please note: You were cared for by a hospitalist during your hospital stay. Once you are discharged, your primary care physician will handle any further medical issues. Please note that NO REFILLS for any discharge medications will be authorized once you are discharged, as it is imperative that you return to your primary care physician (or establish a relationship with a primary care physician if you do not have one) for your post hospital discharge needs so that they can reassess your need for medications and monitor your lab values.  Total Time spent coordinating discharge including counseling, education and face to face time equals 35 minutes.  SignedOren Binet 05/02/2020 9:42 AM

## 2020-05-02 NOTE — Plan of Care (Signed)
  Problem: Education: Goal: Knowledge of disease and its progression will improve Outcome: Adequate for Discharge Goal: Individualized Educational Video(s) Outcome: Adequate for Discharge   Problem: Education: Goal: Individualized Educational Video(s) Outcome: Adequate for Discharge   Problem: Fluid Volume: Goal: Compliance with measures to maintain balanced fluid volume will improve Outcome: Adequate for Discharge   Problem: Health Behavior/Discharge Planning: Goal: Ability to manage health-related needs will improve Outcome: Adequate for Discharge   Problem: Nutritional: Goal: Ability to make healthy dietary choices will improve Outcome: Adequate for Discharge

## 2020-05-03 ENCOUNTER — Telehealth: Payer: Self-pay

## 2020-05-03 LAB — CULTURE, BLOOD (ROUTINE X 2)
Culture: NO GROWTH
Special Requests: ADEQUATE

## 2020-05-03 NOTE — Telephone Encounter (Signed)
Transition Care Management Unsuccessful Follow-up Telephone Call  Date of discharge and from where:  05/02/2020-[facility]  Attempts:  1st Attempt  Reason for unsuccessful TCM follow-up call:  No answer/busy     

## 2020-05-04 ENCOUNTER — Telehealth: Payer: Self-pay

## 2020-05-04 LAB — CULTURE, BLOOD (ROUTINE X 2)
Culture: NO GROWTH
Special Requests: ADEQUATE

## 2020-05-04 NOTE — Telephone Encounter (Signed)
Transition Care Management Unsuccessful Follow-up Telephone Call  Date of discharge and from where:  05/02/2020-[facility]  Attempts:  2nd Attempt  Reason for unsuccessful TCM follow-up call:  No answer/busy    

## 2020-05-04 NOTE — Telephone Encounter (Signed)
Transition Care Management Unsuccessful Follow-up Telephone Call  Date of discharge and from where:  05/02/20-[facility]  Attempts:  3rd Attempt  Reason for unsuccessful TCM follow-up call:  Left voice message

## 2020-05-12 ENCOUNTER — Other Ambulatory Visit: Payer: Self-pay

## 2020-05-12 ENCOUNTER — Ambulatory Visit (INDEPENDENT_AMBULATORY_CARE_PROVIDER_SITE_OTHER): Payer: Managed Care, Other (non HMO) | Admitting: Registered Nurse

## 2020-05-12 ENCOUNTER — Emergency Department (HOSPITAL_BASED_OUTPATIENT_CLINIC_OR_DEPARTMENT_OTHER)
Admission: EM | Admit: 2020-05-12 | Discharge: 2020-05-12 | Disposition: A | Discharge status: Home / Self Care | Payer: Managed Care, Other (non HMO) | Attending: Emergency Medicine | Admitting: Emergency Medicine

## 2020-05-12 ENCOUNTER — Emergency Department (HOSPITAL_BASED_OUTPATIENT_CLINIC_OR_DEPARTMENT_OTHER): Payer: Managed Care, Other (non HMO)

## 2020-05-12 ENCOUNTER — Encounter (HOSPITAL_BASED_OUTPATIENT_CLINIC_OR_DEPARTMENT_OTHER): Payer: Self-pay | Admitting: Emergency Medicine

## 2020-05-12 ENCOUNTER — Encounter: Payer: Self-pay | Admitting: Registered Nurse

## 2020-05-12 VITALS — BP 138/76 | HR 103 | Temp 97.8°F | Resp 18 | Ht 73.0 in | Wt 208.2 lb

## 2020-05-12 DIAGNOSIS — Z79899 Other long term (current) drug therapy: Secondary | ICD-10-CM | POA: Insufficient documentation

## 2020-05-12 DIAGNOSIS — N183 Chronic kidney disease, stage 3 unspecified: Secondary | ICD-10-CM | POA: Insufficient documentation

## 2020-05-12 DIAGNOSIS — M7989 Other specified soft tissue disorders: Secondary | ICD-10-CM | POA: Diagnosis not present

## 2020-05-12 DIAGNOSIS — M79641 Pain in right hand: Secondary | ICD-10-CM | POA: Diagnosis not present

## 2020-05-12 DIAGNOSIS — L089 Local infection of the skin and subcutaneous tissue, unspecified: Secondary | ICD-10-CM | POA: Diagnosis not present

## 2020-05-12 DIAGNOSIS — F17210 Nicotine dependence, cigarettes, uncomplicated: Secondary | ICD-10-CM | POA: Insufficient documentation

## 2020-05-12 DIAGNOSIS — R609 Edema, unspecified: Secondary | ICD-10-CM

## 2020-05-12 DIAGNOSIS — I129 Hypertensive chronic kidney disease with stage 1 through stage 4 chronic kidney disease, or unspecified chronic kidney disease: Secondary | ICD-10-CM | POA: Insufficient documentation

## 2020-05-12 DIAGNOSIS — R059 Cough, unspecified: Secondary | ICD-10-CM

## 2020-05-12 DIAGNOSIS — J449 Chronic obstructive pulmonary disease, unspecified: Secondary | ICD-10-CM | POA: Insufficient documentation

## 2020-05-12 DIAGNOSIS — E1122 Type 2 diabetes mellitus with diabetic chronic kidney disease: Secondary | ICD-10-CM | POA: Diagnosis not present

## 2020-05-12 DIAGNOSIS — D472 Monoclonal gammopathy: Secondary | ICD-10-CM

## 2020-05-12 DIAGNOSIS — Z09 Encounter for follow-up examination after completed treatment for conditions other than malignant neoplasm: Secondary | ICD-10-CM

## 2020-05-12 DIAGNOSIS — R7989 Other specified abnormal findings of blood chemistry: Secondary | ICD-10-CM | POA: Diagnosis not present

## 2020-05-12 LAB — CBC WITH DIFFERENTIAL/PLATELET
Abs Immature Granulocytes: 0.09 10*3/uL — ABNORMAL HIGH (ref 0.00–0.07)
Basophils Absolute: 0 10*3/uL (ref 0.0–0.1)
Basophils Relative: 0 %
Eosinophils Absolute: 0.1 10*3/uL (ref 0.0–0.5)
Eosinophils Relative: 1 %
HCT: 35 % — ABNORMAL LOW (ref 39.0–52.0)
Hemoglobin: 11.5 g/dL — ABNORMAL LOW (ref 13.0–17.0)
Immature Granulocytes: 1 %
Lymphocytes Relative: 13 %
Lymphs Abs: 1.5 10*3/uL (ref 0.7–4.0)
MCH: 31.6 pg (ref 26.0–34.0)
MCHC: 32.9 g/dL (ref 30.0–36.0)
MCV: 96.2 fL (ref 80.0–100.0)
Monocytes Absolute: 0.9 10*3/uL (ref 0.1–1.0)
Monocytes Relative: 8 %
Neutro Abs: 8.5 10*3/uL — ABNORMAL HIGH (ref 1.7–7.7)
Neutrophils Relative %: 77 %
Platelets: 414 10*3/uL — ABNORMAL HIGH (ref 150–400)
RBC: 3.64 MIL/uL — ABNORMAL LOW (ref 4.22–5.81)
RDW: 13.4 % (ref 11.5–15.5)
WBC: 11 10*3/uL — ABNORMAL HIGH (ref 4.0–10.5)
nRBC: 0 % (ref 0.0–0.2)

## 2020-05-12 LAB — BASIC METABOLIC PANEL
Anion gap: 16 — ABNORMAL HIGH (ref 5–15)
BUN: 38 mg/dL — ABNORMAL HIGH (ref 8–23)
CO2: 16 mmol/L — ABNORMAL LOW (ref 22–32)
Calcium: 9.6 mg/dL (ref 8.9–10.3)
Chloride: 104 mmol/L (ref 98–111)
Creatinine, Ser: 1.64 mg/dL — ABNORMAL HIGH (ref 0.61–1.24)
GFR, Estimated: 44 mL/min — ABNORMAL LOW (ref >60)
Glucose, Bld: 135 mg/dL — ABNORMAL HIGH (ref 70–99)
Potassium: 4.1 mmol/L (ref 3.5–5.1)
Sodium: 136 mmol/L (ref 135–145)

## 2020-05-12 LAB — SEDIMENTATION RATE: Sed Rate: 115 mm/hr — ABNORMAL HIGH (ref 0–16)

## 2020-05-12 MED ORDER — DOXYCYCLINE HYCLATE 100 MG PO CAPS: 100.0000 mg | ORAL_CAPSULE | Freq: Two times a day (BID) | ORAL | 0 refills | Status: AC

## 2020-05-12 MED ORDER — VANCOMYCIN VARIABLE DOSE PER UNSTABLE RENAL FUNCTION (PHARMACIST DOSING)
Status: DC
  Filled 2020-05-12: qty 1

## 2020-05-12 MED ORDER — ACETAMINOPHEN 500 MG PO TABS
1000.0000 mg | ORAL_TABLET | Freq: Once | ORAL | Status: AC
  Administered 2020-05-12: 1000 mg via ORAL
  Filled 2020-05-12: qty 2

## 2020-05-12 MED ORDER — VANCOMYCIN HCL IN DEXTROSE 1-5 GM/200ML-% IV SOLN
1000.0000 mg | Freq: Once | INTRAVENOUS | Status: AC
  Administered 2020-05-12: 1000 mg via INTRAVENOUS
  Filled 2020-05-12: qty 200

## 2020-05-12 MED ORDER — SODIUM CHLORIDE 0.9 % IV SOLN
2.0000 g | Freq: Once | INTRAVENOUS | Status: AC
  Administered 2020-05-12: 2 g via INTRAVENOUS
  Filled 2020-05-12: qty 20

## 2020-05-12 MED ORDER — VANCOMYCIN HCL 2000 MG/400ML IV SOLN
2000.0000 mg | Freq: Once | INTRAVENOUS | Status: DC
  Filled 2020-05-12: qty 400

## 2020-05-12 MED ORDER — ALBUTEROL SULFATE HFA 108 (90 BASE) MCG/ACT IN AERS: 2.0000 | INHALATION_SPRAY | Freq: Four times a day (QID) | RESPIRATORY_TRACT | 3 refills | Status: DC | PRN

## 2020-05-12 MED ORDER — OXYCODONE HCL 5 MG PO TABS: 5.0000 mg | ORAL_TABLET | Freq: Four times a day (QID) | ORAL | 0 refills | Status: DC | PRN

## 2020-05-12 MED ORDER — VANCOMYCIN HCL 750 MG/150ML IV SOLN
750.0000 mg | Freq: Two times a day (BID) | INTRAVENOUS | Status: DC
  Filled 2020-05-12: qty 150
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
## 2020-05-12 NOTE — ED Triage Notes (Signed)
Pt arrives pov, referred by PCP for R swollen hand x 1 week. Pt endorses having IV in arm while hospitalized. Pt denies injury

## 2020-05-12 NOTE — Patient Instructions (Addendum)
John Blake -   I'm sorry your hand isn't doing well.   My top concern is an infection - these can happen in deep spaces in your hand give you all the symptoms you have experienced: redness, pain, warmth, and swelling. We need to do some imaging to determine if this is the case and if we can treat this with oral antibiotics, IV antibiotics, or if we need to get you in with a hand specialist to address this  I less so suspect a clot, but this would be important to rule out as well  Your labs in the hospital last week showed that your MGUS (abnormal proteins in the blood) may have changed. These may have even contributed to your kidneys poor function at that time. My concern is that this is also contributing to your malaise, fatigue, weight loss, and loss of appetite. This could be something like a blood cancer along the lines of Multiple Myeloma, Lymphoma, or Waldenstom's Macroglobulinemia. While there is no firm diagnosis, if this is the case, these are treatable. I have placed a referral to the Hematology / Oncology group with [facility].  Again, I'm so sorry you're feeling this way - we're going to do everything we can to keep you out of the hospital.  Thanks,   Rich     If you have lab work done today you will be contacted with your lab results within the next 2 weeks.  If you have not heard from [place] then please contact us. The fastest way to get your results is to register for My Chart.   IF you received an x-ray today, you will receive an invoice from [facility]. Please contact [facility] at [phone] with questions or concerns regarding your invoice.   IF you received labwork today, you will receive an invoice from [facility]. Please contact [facility] at [phone] with questions or concerns regarding your invoice.   Our billing staff will not be able to assist you with questions regarding bills from these companies.  You will be contacted with the lab results as soon as  they are available. The fastest way to get your results is to activate your My Chart account. Instructions are located on the last page of this paperwork. If you have not heard from [place] regarding the results in 2 weeks, please contact this office.
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
## 2020-05-12 NOTE — Discharge Instructions (Signed)
If the redness is spreading up your arm, or if you begin having fevers, or if the pain is becoming more severe, you need to return to the ER immediately.  This may be signs of a worsening infection that needs attention, possibly even surgery.  You will take your next dose of antibiotics tomorrow morning.  I sent a prescription to your pharmacy.  You can continue taking Tylenol for pain, and oxycodone for severe pain.  You can use ice packs on your hand.  Please call to schedule follow-up appointment with your primary care doctor early next week.  I want a make sure that your hand infection is getting better.  If it is not improving, you may need to come back to the ER.

## 2020-05-12 NOTE — ED Notes (Signed)
ED Provider at bedside. 

## 2020-05-12 NOTE — Progress Notes (Addendum)
Pharmacy Antibiotic Note  John Blake is a 75 y.o. male admitted on 05/12/2020 with wound infection (concern for extensor tenosynovitis).  Pharmacy has been consulted for vancomycin dosing.  Plan: Ceftriaxone 2g IV x1 per MD Vancomycin 2g IV x1, then 750mg  IV q12h (goal trough 15-20) - Monitor renal function  - Monitor clinical improvement, LOT, and de-escalation    Height: 6\' 1"  (185.4 cm) Weight: 94.3 kg (208 lb) IBW/kg (Calculated) : 79.9  Temp (24hrs), Avg:98 F (36.7 C), Min:97.8 F (36.6 C), Max:98.1 F (36.7 C)  No results for input(s): WBC, CREATININE, LATICACIDVEN, VANCOTROUGH, VANCOPEAK, VANCORANDOM, GENTTROUGH, GENTPEAK, GENTRANDOM, TOBRATROUGH, TOBRAPEAK, TOBRARND, AMIKACINPEAK, AMIKACINTROU, AMIKACIN in the last 168 hours.  Estimated Creatinine Clearance: 44.1 mL/min (A) (by C-G formula based on SCr of 1.71 mg/dL (H)).    Allergies  Allergen Reactions  . Nsaids     Kidney disease    Antimicrobials this admission: Ceftriaxone 4/27 x1 Vancomycin 4/27 >>   Dose adjustments this admission:  Microbiology results:  Thank you for allowing pharmacy to be a part of this patient's care.  Claudina Lick, PharmD PGY1 Acute Care Pharmacy Resident 05/12/2020 4:12 PM  Please check [email or web address] for unit-specific pharmacy phone numbers.

## 2020-05-19 ENCOUNTER — Inpatient Hospital Stay: Payer: Medicare HMO | Admitting: Registered Nurse

## 2020-05-19 ENCOUNTER — Telehealth: Payer: Self-pay

## 2020-05-19 ENCOUNTER — Telehealth (INDEPENDENT_AMBULATORY_CARE_PROVIDER_SITE_OTHER): Payer: Medicare HMO | Admitting: Registered Nurse

## 2020-05-19 DIAGNOSIS — Z09 Encounter for follow-up examination after completed treatment for conditions other than malignant neoplasm: Secondary | ICD-10-CM

## 2020-05-19 DIAGNOSIS — R42 Dizziness and giddiness: Secondary | ICD-10-CM | POA: Diagnosis not present

## 2020-05-19 DIAGNOSIS — L03113 Cellulitis of right upper limb: Secondary | ICD-10-CM

## 2020-05-19 NOTE — Telephone Encounter (Signed)
Caller states patient is severely dizzy. She states he has an appt today. Caller states he would like to reschedule. Caller states he sees NP Maximiano Coss.
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
## 2020-05-25 ENCOUNTER — Telehealth: Payer: Self-pay | Admitting: Oncology

## 2020-05-25 NOTE — Telephone Encounter (Signed)
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 Mr. Filley returned my call and has been scheduled to see Dr. Alen Blew on 5/11 at 11am. Pt aware to arrive 15 minutes early.

## 2020-05-26 ENCOUNTER — Other Ambulatory Visit: Payer: Self-pay

## 2020-05-26 ENCOUNTER — Inpatient Hospital Stay: Payer: Medicare HMO | Attending: Oncology | Admitting: Oncology

## 2020-05-26 VITALS — BP 118/81 | HR 105 | Temp 97.8°F | Resp 17 | Ht 73.0 in | Wt 203.4 lb

## 2020-05-26 DIAGNOSIS — E785 Hyperlipidemia, unspecified: Secondary | ICD-10-CM | POA: Insufficient documentation

## 2020-05-26 DIAGNOSIS — N179 Acute kidney failure, unspecified: Secondary | ICD-10-CM | POA: Insufficient documentation

## 2020-05-26 DIAGNOSIS — E860 Dehydration: Secondary | ICD-10-CM | POA: Diagnosis not present

## 2020-05-26 DIAGNOSIS — J449 Chronic obstructive pulmonary disease, unspecified: Secondary | ICD-10-CM | POA: Insufficient documentation

## 2020-05-26 DIAGNOSIS — I10 Essential (primary) hypertension: Secondary | ICD-10-CM | POA: Insufficient documentation

## 2020-05-26 DIAGNOSIS — I700 Atherosclerosis of aorta: Secondary | ICD-10-CM | POA: Diagnosis not present

## 2020-05-26 DIAGNOSIS — D472 Monoclonal gammopathy: Secondary | ICD-10-CM | POA: Diagnosis not present

## 2020-05-26 DIAGNOSIS — I129 Hypertensive chronic kidney disease with stage 1 through stage 4 chronic kidney disease, or unspecified chronic kidney disease: Secondary | ICD-10-CM | POA: Insufficient documentation

## 2020-05-26 DIAGNOSIS — K4020 Bilateral inguinal hernia, without obstruction or gangrene, not specified as recurrent: Secondary | ICD-10-CM | POA: Diagnosis not present

## 2020-05-26 DIAGNOSIS — N189 Chronic kidney disease, unspecified: Secondary | ICD-10-CM | POA: Insufficient documentation

## 2020-05-26 DIAGNOSIS — M79641 Pain in right hand: Secondary | ICD-10-CM | POA: Diagnosis not present

## 2020-05-26 DIAGNOSIS — E119 Type 2 diabetes mellitus without complications: Secondary | ICD-10-CM | POA: Diagnosis not present

## 2020-05-26 DIAGNOSIS — F17210 Nicotine dependence, cigarettes, uncomplicated: Secondary | ICD-10-CM | POA: Insufficient documentation
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
## 2020-06-11 ENCOUNTER — Telehealth: Payer: Self-pay | Admitting: Registered Nurse

## 2020-06-11 NOTE — Telephone Encounter (Signed)
Left message for patient to schedule Annual Wellness Visit.  Please schedule with Nurse Health Advisor Martha Stanley, RN at [facility]  

## 2020-06-21 ENCOUNTER — Ambulatory Visit (INDEPENDENT_AMBULATORY_CARE_PROVIDER_SITE_OTHER): Payer: Medicare HMO | Admitting: *Deleted

## 2020-06-21 DIAGNOSIS — Z0000 Encounter for general adult medical examination without abnormal findings: Secondary | ICD-10-CM

## 2020-06-21 NOTE — Patient Instructions (Signed)
John Blake , Thank you for taking time to come for your Medicare Wellness Visit. I appreciate your ongoing commitment to your health goals. Please review the following plan we discussed and let me know if I can assist you in the future.   Screening recommendations/referrals: Colonoscopy: up to date Recommended yearly ophthalmology/optometry visit for glaucoma screening and checkup Recommended yearly dental visit for hygiene and checkup  Vaccinations: Influenza vaccine: up to date Pneumococcal vaccine: up to date Tdap vaccine: up to date  Shingles vaccine: education provided  Advanced directives:   No  Education provided  Conditions/risks identified:  N/A  Next appointment:   Preventive Care 50 Years and Older, Male Preventive care refers to lifestyle choices and visits with your health care provider that can promote health and wellness. What does preventive care include?  A yearly physical exam. This is also called an annual well check.  Dental exams once or twice a year.  Routine eye exams. Ask your health care provider how often you should have your eyes checked.  Personal lifestyle choices, including:  Daily care of your teeth and gums.  Regular physical activity.  Eating a healthy diet.  Avoiding tobacco and drug use.  Limiting alcohol use.  Practicing safe sex.  Taking low doses of aspirin every day.  Taking vitamin and mineral supplements as recommended by your health care provider. What happens during an annual well check? The services and screenings done by your health care provider during your annual well check will depend on your age, overall health, lifestyle risk factors, and family history of disease. Counseling  Your health care provider may ask you questions about your:  Alcohol use.  Tobacco use.  Drug use.  Emotional well-being.  Home and relationship well-being.  Sexual activity.  Eating habits.  History of falls.  Memory and ability  to understand (cognition).  Work and work Statistician. Screening  You may have the following tests or measurements:  Height, weight, and BMI.  Blood pressure.  Lipid and cholesterol levels. These may be checked every 5 years, or more frequently if you are over 81 years old.  Skin check.  Lung cancer screening. You may have this screening every year starting at age 70 if you have a 30-pack-year history of smoking and currently smoke or have quit within the past 15 years.  Fecal occult blood test (FOBT) of the stool. You may have this test every year starting at age 33.  Flexible sigmoidoscopy or colonoscopy. You may have a sigmoidoscopy every 5 years or a colonoscopy every 10 years starting at age 70.  Prostate cancer screening. Recommendations will vary depending on your family history and other risks.  Hepatitis C blood test.  Hepatitis B blood test.  Sexually transmitted disease (STD) testing.  Diabetes screening. This is done by checking your blood sugar (glucose) after you have not eaten for a while (fasting). You may have this done every 1-3 years.  Abdominal aortic aneurysm (AAA) screening. You may need this if you are a current or former smoker.  Osteoporosis. You may be screened starting at age 31 if you are at high risk. Talk with your health care provider about your test results, treatment options, and if necessary, the need for more tests. Vaccines  Your health care provider may recommend certain vaccines, such as:  Influenza vaccine. This is recommended every year.  Tetanus, diphtheria, and acellular pertussis (Tdap, Td) vaccine. You may need a Td booster every 10 years.  Zoster vaccine. You  may need this after age 79.  Pneumococcal 13-valent conjugate (PCV13) vaccine. One dose is recommended after age 65.  Pneumococcal polysaccharide (PPSV23) vaccine. One dose is recommended after age 54. Talk to your health care provider about which screenings and vaccines  you need and how often you need them. This information is not intended to replace advice given to you by your health care provider. Make sure you discuss any questions you have with your health care provider. Document Released: 01/29/2015 Document Revised: 09/22/2015 Document Reviewed: 11/03/2014 Elsevier Interactive Patient Education  2017 [place] Prevention in the Home Falls can cause injuries. They can happen to people of all ages. There are many things you can do to make your home safe and to help prevent falls. What can I do on the outside of my home?  Regularly fix the edges of walkways and driveways and fix any cracks.  Remove anything that might make you trip as you walk through a door, such as a raised step or threshold.  Trim any bushes or trees on the path to your home.  Use bright outdoor lighting.  Clear any walking paths of anything that might make someone trip, such as rocks or tools.  Regularly check to see if handrails are loose or broken. Make sure that both sides of any steps have handrails.  Any raised decks and porches should have guardrails on the edges.  Have any leaves, snow, or ice cleared regularly.  Use sand or salt on walking paths during winter.  Clean up any spills in your garage right away. This includes oil or grease spills. What can I do in the bathroom?  Use night lights.  Install grab bars by the toilet and in the tub and shower. Do not use towel bars as grab bars.  Use non-skid mats or decals in the tub or shower.  If you need to sit down in the shower, use a plastic, non-slip stool.  Keep the floor dry. Clean up any water that spills on the floor as soon as it happens.  Remove soap buildup in the tub or shower regularly.  Attach bath mats securely with double-sided non-slip rug tape.  Do not have throw rugs and other things on the floor that can make you trip. What can I do in the bedroom?  Use night lights.  Make sure  that you have a light by your bed that is easy to reach.  Do not use any sheets or blankets that are too big for your bed. They should not hang down onto the floor.  Have a firm chair that has side arms. You can use this for support while you get dressed.  Do not have throw rugs and other things on the floor that can make you trip. What can I do in the kitchen?  Clean up any spills right away.  Avoid walking on wet floors.  Keep items that you use a lot in easy-to-reach places.  If you need to reach something above you, use a strong step stool that has a grab bar.  Keep electrical cords out of the way.  Do not use floor polish or wax that makes floors slippery. If you must use wax, use non-skid floor wax.  Do not have throw rugs and other things on the floor that can make you trip. What can I do with my stairs?  Do not leave any items on the stairs.  Make sure that there are handrails on both  sides of the stairs and use them. Fix handrails that are broken or loose. Make sure that handrails are as long as the stairways.  Check any carpeting to make sure that it is firmly attached to the stairs. Fix any carpet that is loose or worn.  Avoid having throw rugs at the top or bottom of the stairs. If you do have throw rugs, attach them to the floor with carpet tape.  Make sure that you have a light switch at the top of the stairs and the bottom of the stairs. If you do not have them, ask someone to add them for you. What else can I do to help prevent falls?  Wear shoes that:  Do not have high heels.  Have rubber bottoms.  Are comfortable and fit you well.  Are closed at the toe. Do not wear sandals.  If you use a stepladder:  Make sure that it is fully opened. Do not climb a closed stepladder.  Make sure that both sides of the stepladder are locked into place.  Ask someone to hold it for you, if possible.  Clearly mark and make sure that you can see:  Any grab bars or  handrails.  First and last steps.  Where the edge of each step is.  Use tools that help you move around (mobility aids) if they are needed. These include:  Canes.  Walkers.  Scooters.  Crutches.  Turn on the lights when you go into a dark area. Replace any light bulbs as soon as they burn out.  Set up your furniture so you have a clear path. Avoid moving your furniture around.  If any of your floors are uneven, fix them.  If there are any pets around you, be aware of where they are.  Review your medicines with your doctor. Some medicines can make you feel dizzy. This can increase your chance of falling. Ask your doctor what other things that you can do to help prevent falls. This information is not intended to replace advice given to you by your health care provider. Make sure you discuss any questions you have with your health care provider. Document Released: 10/29/2008 Document Revised: 06/10/2015 Document Reviewed: 02/06/2014 Elsevier Interactive Patient Education  2017 [employer].
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
## 2020-07-15 ENCOUNTER — Other Ambulatory Visit: Payer: Self-pay | Admitting: Physician Assistant

## 2020-07-15 NOTE — Telephone Encounter (Signed)
discontinued

## 2020-10-26 ENCOUNTER — Inpatient Hospital Stay: Payer: Medicare HMO | Attending: Oncology

## 2020-11-02 ENCOUNTER — Inpatient Hospital Stay: Payer: Medicare HMO | Admitting: Oncology

## 2020-11-02 ENCOUNTER — Telehealth: Payer: Self-pay | Admitting: *Deleted

## 2020-11-02 NOTE — Telephone Encounter (Signed)
PC to patient regarding missed appointment today, no answer, left VM.  Informed patient scheduling will be contacting him to reschedule his appointment.  Scheduling message sent.

## 2020-11-03 ENCOUNTER — Telehealth: Payer: Self-pay | Admitting: Oncology

## 2020-11-03 NOTE — Telephone Encounter (Signed)
Scheduled per sch msg. Called and left msg  

## 2020-11-09 ENCOUNTER — Telehealth: Payer: Self-pay | Admitting: Oncology

## 2020-11-09 NOTE — Telephone Encounter (Signed)
Rescheduled per 10/24 pt request, pt has confirmed new appt

## 2020-11-10 ENCOUNTER — Inpatient Hospital Stay: Payer: Medicare HMO

## 2020-11-17 ENCOUNTER — Ambulatory Visit: Payer: Medicare HMO | Admitting: Oncology

## 2020-12-14 ENCOUNTER — Other Ambulatory Visit: Payer: Self-pay

## 2020-12-14 ENCOUNTER — Inpatient Hospital Stay: Payer: Medicare HMO | Attending: Oncology

## 2020-12-14 DIAGNOSIS — D472 Monoclonal gammopathy: Secondary | ICD-10-CM | POA: Insufficient documentation

## 2020-12-14 LAB — CMP (CANCER CENTER ONLY)
ALT: 17 U/L (ref 0–44)
AST: 18 U/L (ref 15–41)
Albumin: 4.3 g/dL (ref 3.5–5.0)
Alkaline Phosphatase: 55 U/L (ref 38–126)
Anion gap: 8 (ref 5–15)
BUN: 30 mg/dL — ABNORMAL HIGH (ref 8–23)
CO2: 22 mmol/L (ref 22–32)
Calcium: 9.2 mg/dL (ref 8.9–10.3)
Chloride: 108 mmol/L (ref 98–111)
Creatinine: 1.5 mg/dL — ABNORMAL HIGH (ref 0.61–1.24)
GFR, Estimated: 49 mL/min — ABNORMAL LOW (ref >60)
Glucose, Bld: 117 mg/dL — ABNORMAL HIGH (ref 70–99)
Potassium: 4.3 mmol/L (ref 3.5–5.1)
Sodium: 138 mmol/L (ref 135–145)
Total Bilirubin: 0.5 mg/dL (ref 0.3–1.2)
Total Protein: 7.5 g/dL (ref 6.5–8.1)

## 2020-12-14 LAB — CBC WITH DIFFERENTIAL (CANCER CENTER ONLY)
Abs Immature Granulocytes: 0.03 10*3/uL (ref 0.00–0.07)
Basophils Absolute: 0 10*3/uL (ref 0.0–0.1)
Basophils Relative: 0 %
Eosinophils Absolute: 0.2 10*3/uL (ref 0.0–0.5)
Eosinophils Relative: 3 %
HCT: 41.4 % (ref 39.0–52.0)
Hemoglobin: 14.3 g/dL (ref 13.0–17.0)
Immature Granulocytes: 0 %
Lymphocytes Relative: 32 %
Lymphs Abs: 2.6 10*3/uL (ref 0.7–4.0)
MCH: 33.3 pg (ref 26.0–34.0)
MCHC: 34.5 g/dL (ref 30.0–36.0)
MCV: 96.5 fL (ref 80.0–100.0)
Monocytes Absolute: 0.6 10*3/uL (ref 0.1–1.0)
Monocytes Relative: 7 %
Neutro Abs: 4.7 10*3/uL (ref 1.7–7.7)
Neutrophils Relative %: 58 %
Platelet Count: 243 10*3/uL (ref 150–400)
RBC: 4.29 MIL/uL (ref 4.22–5.81)
RDW: 13.7 % (ref 11.5–15.5)
WBC Count: 8.1 10*3/uL (ref 4.0–10.5)
nRBC: 0 % (ref 0.0–0.2)

## 2020-12-15 LAB — KAPPA/LAMBDA LIGHT CHAINS
Kappa free light chain: 43.7 mg/L — ABNORMAL HIGH (ref 3.3–19.4)
Kappa, lambda light chain ratio: 1.99 — ABNORMAL HIGH (ref 0.26–1.65)
Lambda free light chains: 22 mg/L (ref 5.7–26.3)

## 2020-12-20 LAB — MULTIPLE MYELOMA PANEL, SERUM
Albumin SerPl Elph-Mcnc: 3.8 g/dL (ref 2.9–4.4)
Albumin/Glob SerPl: 1.3 (ref 0.7–1.7)
Alpha 1: 0.2 g/dL (ref 0.0–0.4)
Alpha2 Glob SerPl Elph-Mcnc: 0.9 g/dL (ref 0.4–1.0)
B-Globulin SerPl Elph-Mcnc: 1 g/dL (ref 0.7–1.3)
Gamma Glob SerPl Elph-Mcnc: 0.8 g/dL (ref 0.4–1.8)
Globulin, Total: 3 g/dL (ref 2.2–3.9)
IgA: 194 mg/dL (ref 61–437)
IgG (Immunoglobin G), Serum: 853 mg/dL (ref 603–1613)
IgM (Immunoglobulin M), Srm: 98 mg/dL (ref 15–143)
M Protein SerPl Elph-Mcnc: 0.3 g/dL — ABNORMAL HIGH
Total Protein ELP: 6.8 g/dL (ref 6.0–8.5)

## 2020-12-21 ENCOUNTER — Other Ambulatory Visit: Payer: Self-pay

## 2020-12-21 ENCOUNTER — Inpatient Hospital Stay: Payer: Medicare HMO | Attending: Oncology | Admitting: Oncology

## 2020-12-21 VITALS — BP 155/96 | HR 88 | Temp 97.8°F | Resp 18 | Wt 219.6 lb

## 2020-12-21 DIAGNOSIS — R768 Other specified abnormal immunological findings in serum: Secondary | ICD-10-CM | POA: Diagnosis not present

## 2020-12-21 DIAGNOSIS — Z79899 Other long term (current) drug therapy: Secondary | ICD-10-CM | POA: Insufficient documentation

## 2020-12-21 DIAGNOSIS — D472 Monoclonal gammopathy: Secondary | ICD-10-CM | POA: Diagnosis not present

## 2020-12-21 NOTE — Progress Notes (Signed)
Hematology and Oncology Follow Up Visit  John Blake [id number] 1945-03-06 75 y.o. 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  He was found to have 0.2 g/dL M spike and normal quantitative immunoglobulins.  He has mild elevation in the kappa to lambda ratio.     Current therapy: Active surveillance  Interim History: [name] returns today for a follow-up visit.  Since the last visit, he reports no major changes in his health.  He denies any worsening bone pain or pathological fractures.  He denies any hospitalizations or illnesses.  Performance status quality of life remain excellent.     Medications: I have reviewed the patient's current medications.  Current Outpatient Medications  Medication Sig Dispense Refill   acetaminophen (TYLENOL) 325 MG tablet Take 325 mg by mouth 2 (two) times daily.     albuterol (VENTOLIN HFA) 108 (90 Base) MCG/ACT inhaler Inhale 2 puffs into the lungs every 6 (six) hours as needed for shortness of breath. 8 g 3   allopurinol (ZYLOPRIM) 100 MG tablet Take 1 tablet (100 mg total) by mouth daily. (Patient taking differently: Take 100 mg by mouth daily as needed (For gout).) 90 tablet 1   amLODipine (NORVASC) 5 MG tablet Take 1 tablet (5 mg total) by mouth daily. 30 tablet 11   oxyCODONE (ROXICODONE) 5 MG immediate release tablet Take 1 tablet (5 mg total) by mouth every 6 (six) hours as needed for up to 10 doses for severe pain. (Patient not taking: Reported on 06/21/2020) 10 tablet 0   No current facility-administered medications for this visit.     Allergies:  Allergies  Allergen Reactions   Nsaids     Kidney disease      Physical Exam: Blood pressure (!) 155/96, pulse 88, temperature 97.8 F (36.6 C), temperature source Tympanic, resp. rate 18, weight 219 lb 9.6 oz (99.6 kg), SpO2 100 %.  ECOG: 1    General appearance: Comfortable appearing without  any discomfort Head: Normocephalic without any trauma Oropharynx: Mucous membranes are moist and pink without any thrush or ulcers. Eyes: Pupils are equal and round reactive to light. Lymph nodes: No cervical, supraclavicular, inguinal or axillary lymphadenopathy.   Heart:regular rate and rhythm.  S1 and S2 without leg edema. Lung: Clear without any rhonchi or wheezes.  No dullness to percussion. Abdomin: Soft, nontender, nondistended with good bowel sounds.  No hepatosplenomegaly. Musculoskeletal: No joint deformity or effusion.  Full range of motion noted. Neurological: No deficits noted on motor, sensory and deep tendon reflex exam. Skin: No petechial rash or dryness.  Appeared moist.      Lab Results: Lab Results  Component Value Date   WBC 8.1 12/14/2020   HGB 14.3 12/14/2020   HCT 41.4 12/14/2020   MCV 96.5 12/14/2020   PLT 243 12/14/2020     Chemistry      Component Value Date/Time   NA 138 12/14/2020 1005   NA 139 08/12/2019 1657   K 4.3 12/14/2020 1005   CL 108 12/14/2020 1005   CO2 22 12/14/2020 1005   BUN 30 (H) 12/14/2020 1005   BUN 30 (H) 08/12/2019 1657   CREATININE 1.50 (H) 12/14/2020 1005   CREATININE 1.29 (H) 01/18/2015 1418   GLU 116 03/27/2014 0000      Component Value Date/Time   CALCIUM 9.2 12/14/2020 1005   ALKPHOS 55 12/14/2020 1005   AST 18 12/14/2020 1005   ALT  17 12/14/2020 1005   BILITOT 0.5 12/14/2020 1005         Impression and Plan:   75 year old man with:   1.  IgG kappa MGUS diagnosed in April 2022.  He was found to have M spike of 0.2 g/dL, normal quantitative immunoglobulins and slight elevation in the kappa to lambda ratio.   Laboratory data from December 14, 2020 were personally reviewed and discussed today with the patient.  He had continues to have very low M spike at 0.3 g/dL, normal quantitative immunoglobulins and no evidence of endorgan damage.  The differential diagnosis of these findings were discussed.  These  findings are consistent with MGUS rather than multiple myeloma.  Risk of transitioning into multiple myeloma were reviewed and at this time remains at very low at 1% a year.  I recommended continued active surveillance with repeat testing in 1 year.  2.  Acute renal failure: Resolved at this time and not consistent with plasma cell disorder.  Kidney function approaching normal range.    3.  Follow-up: In 1 year for repeat follow-up.       30  minutes were spent on this encounter.  The time was dedicated to reviewing laboratory data, disease status update, treatment choices and outlining future plan of care review.     Zola Button, MD 12/6/20222:53 PM

## 2021-01-01 ENCOUNTER — Other Ambulatory Visit: Payer: Self-pay | Admitting: Registered Nurse

## 2021-01-01 DIAGNOSIS — M109 Gout, unspecified: Secondary | ICD-10-CM
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
## 2021-05-23 NOTE — Telephone Encounter (Signed)
NA

## 2021-08-17 ENCOUNTER — Telehealth: Payer: Self-pay | Admitting: Oncology

## 2021-08-17 NOTE — Telephone Encounter (Signed)
Called patient regarding upcoming December appointment, left a voicemail.  

## 2021-10-01 ENCOUNTER — Emergency Department (HOSPITAL_COMMUNITY)
Admission: EM | Admit: 2021-10-01 | Discharge: 2021-10-02 | Disposition: A | Discharge status: Home / Self Care | Payer: Medicare HMO | Attending: Emergency Medicine | Admitting: Emergency Medicine

## 2021-10-01 ENCOUNTER — Emergency Department (HOSPITAL_COMMUNITY): Payer: Medicare HMO

## 2021-10-01 ENCOUNTER — Other Ambulatory Visit: Payer: Self-pay

## 2021-10-01 ENCOUNTER — Encounter (HOSPITAL_COMMUNITY): Payer: Self-pay | Admitting: Emergency Medicine

## 2021-10-01 DIAGNOSIS — N1831 Chronic kidney disease, stage 3a: Secondary | ICD-10-CM | POA: Insufficient documentation

## 2021-10-01 DIAGNOSIS — M10042 Idiopathic gout, left hand: Secondary | ICD-10-CM | POA: Insufficient documentation

## 2021-10-01 DIAGNOSIS — M25561 Pain in right knee: Secondary | ICD-10-CM | POA: Diagnosis not present

## 2021-10-01 DIAGNOSIS — M25461 Effusion, right knee: Secondary | ICD-10-CM | POA: Diagnosis present

## 2021-10-01 DIAGNOSIS — M79642 Pain in left hand: Secondary | ICD-10-CM | POA: Diagnosis not present

## 2021-10-01 DIAGNOSIS — M10061 Idiopathic gout, right knee: Secondary | ICD-10-CM | POA: Insufficient documentation

## 2021-10-01 DIAGNOSIS — I129 Hypertensive chronic kidney disease with stage 1 through stage 4 chronic kidney disease, or unspecified chronic kidney disease: Secondary | ICD-10-CM | POA: Diagnosis not present

## 2021-10-01 DIAGNOSIS — M7989 Other specified soft tissue disorders: Secondary | ICD-10-CM | POA: Diagnosis not present

## 2021-10-01 LAB — URIC ACID: Uric Acid, Serum: 7.9 mg/dL (ref 3.7–8.6)

## 2021-10-01 LAB — CBC WITH DIFFERENTIAL/PLATELET
Abs Immature Granulocytes: 0.04 10*3/uL (ref 0.00–0.07)
Basophils Absolute: 0 10*3/uL (ref 0.0–0.1)
Basophils Relative: 0 %
Eosinophils Absolute: 0 10*3/uL (ref 0.0–0.5)
Eosinophils Relative: 0 %
HCT: 42.2 % (ref 39.0–52.0)
Hemoglobin: 14.5 g/dL (ref 13.0–17.0)
Immature Granulocytes: 0 %
Lymphocytes Relative: 14 %
Lymphs Abs: 1.5 10*3/uL (ref 0.7–4.0)
MCH: 33.8 pg (ref 26.0–34.0)
MCHC: 34.4 g/dL (ref 30.0–36.0)
MCV: 98.4 fL (ref 80.0–100.0)
Monocytes Absolute: 1 10*3/uL (ref 0.1–1.0)
Monocytes Relative: 10 %
Neutro Abs: 7.6 10*3/uL (ref 1.7–7.7)
Neutrophils Relative %: 76 %
Platelets: 239 10*3/uL (ref 150–400)
RBC: 4.29 MIL/uL (ref 4.22–5.81)
RDW: 13.5 % (ref 11.5–15.5)
WBC: 10.2 10*3/uL (ref 4.0–10.5)
nRBC: 0 % (ref 0.0–0.2)

## 2021-10-01 LAB — BASIC METABOLIC PANEL
Anion gap: 13 (ref 5–15)
BUN: 28 mg/dL — ABNORMAL HIGH (ref 8–23)
CO2: 19 mmol/L — ABNORMAL LOW (ref 22–32)
Calcium: 9.1 mg/dL (ref 8.9–10.3)
Chloride: 104 mmol/L (ref 98–111)
Creatinine, Ser: 1.36 mg/dL — ABNORMAL HIGH (ref 0.61–1.24)
GFR, Estimated: 55 mL/min — ABNORMAL LOW (ref >60)
Glucose, Bld: 120 mg/dL — ABNORMAL HIGH (ref 70–99)
Potassium: 3.9 mmol/L (ref 3.5–5.1)
Sodium: 136 mmol/L (ref 135–145)

## 2021-10-01 NOTE — ED Triage Notes (Signed)
Pt reported to E with c/o severe pain and swelling to rt knee and left hand. States he feels as if it is a gout flare up.

## 2021-10-01 NOTE — ED Provider Triage Note (Signed)
Emergency Medicine Provider Triage Evaluation Note  John Blake , a 76 y.o. male  was evaluated in triage.  Pt complains of left hand pain and right knee pain.  Pain has increased over the past week.  Does have history of gout with similar flareups in the past.  Typically does take medication for his gout, but has not taken it in the past month  Review of Systems  Positive: As above Negative: Fevers, chills  Physical Exam  BP 135/74 (BP Location: Right Arm)   Pulse 93   Temp 97.9 F (36.6 C) (Oral)   Resp 18   SpO2 97%  Gen:   Awake, no distress   Resp:  Normal effort  MSK:   Moves extremities without difficulty  Other:  Left hand erythematous and edematous.  Right knee slightly edematous  Medical Decision Making  Medically screening exam initiated at 9:46 PM.  Appropriate orders placed.  Pearson Everlena Cooper was informed that the remainder of the evaluation will be completed by another provider, this initial triage assessment does not replace that evaluation, and the importance of remaining in the ED until their evaluation is complete.  We will obtain basic labs, uric acid, left hand x-ray, right knee x-ray   Roylene Reason, PA-C 10/01/21 2236

## 2021-10-02 MED ORDER — PREDNISONE 20 MG PO TABS: ORAL_TABLET | ORAL | 0 refills | Status: DC

## 2021-10-02 MED ORDER — OXYCODONE-ACETAMINOPHEN 5-325 MG PO TABS: 1.0000 | ORAL_TABLET | Freq: Four times a day (QID) | ORAL | 0 refills | Status: DC | PRN

## 2021-10-02 MED ORDER — OXYCODONE-ACETAMINOPHEN 5-325 MG PO TABS
1.0000 | ORAL_TABLET | Freq: Once | ORAL | Status: AC
  Administered 2021-10-02: 1 via ORAL
  Filled 2021-10-02: qty 1

## 2021-10-02 MED ORDER — PREDNISONE 20 MG PO TABS
60.0000 mg | ORAL_TABLET | Freq: Once | ORAL | Status: AC
  Administered 2021-10-02: 60 mg via ORAL
  Filled 2021-10-02: qty 3

## 2021-10-02 NOTE — Discharge Instructions (Addendum)
It was our pleasure to provide your ER care today - we hope that you feel better.  Take prednisone as prescribed. You may also take percocet as need for pain. No driving for the next 6 hours or when taking percocet. Also, do not take tylenol or acetaminophen containing medication when taking percocet.   Follow up with primary care doctor this coming week if symptoms fail to improve/resolve.

## 2021-10-02 NOTE — ED Provider Notes (Signed)
[facility] EMERGENCY DEPARTMENT Provider Note   CSN: [id number] Arrival date & time: 10/01/21  2119     History  Chief Complaint  Patient presents with   Gout    John Blake is a 76 y.o. male.  Patient with hx gout c/o pain and swelling to right knee and left hand that feels similar to prior gout flares. Symptoms acute onset, moderate-severe, constant, persistent. No fever, chills or sweats. No chest pain or sob. No abd pain or nvd. No recent change in meds or diet.   The history is provided by the patient and medical records.       Home Medications Prior to Admission medications   Medication Sig Start Date End Date Taking? Authorizing Provider  acetaminophen (TYLENOL) 325 MG tablet Take 325 mg by mouth 2 (two) times daily.    [provider]  albuterol (VENTOLIN HFA) 108 (90 Base) MCG/ACT inhaler Inhale 2 puffs into the lungs every 6 (six) hours as needed for shortness of breath. 05/12/20   Maximiano Coss, NP  allopurinol (ZYLOPRIM) 100 MG tablet Take 1 tablet (100 mg total) by mouth daily. Patient taking differently: Take 100 mg by mouth daily as needed (For gout). 01/03/19   Forrest Moron, MD  amLODipine (NORVASC) 5 MG tablet Take 1 tablet (5 mg total) by mouth daily. 05/02/20 05/02/21  Ghimire, Henreitta Leber, MD  colchicine 0.6 MG tablet TAKE 1 TABLET (0.6 MG TOTAL) BY MOUTH 2 (TWO) TIMES DAILY AS NEEDED. 01/03/21   Maximiano Coss, NP  oxyCODONE (ROXICODONE) 5 MG immediate release tablet Take 1 tablet (5 mg total) by mouth every 6 (six) hours as needed for up to 10 doses for severe pain. Patient not taking: Reported on 06/21/2020 05/12/20   Wyvonnia Dusky, MD      Allergies    Nsaids    Review of Systems   Review of Systems  Constitutional:  Negative for chills and fever.  Respiratory:  Negative for shortness of breath.   Cardiovascular:  Negative for chest pain.  Gastrointestinal:  Negative for abdominal pain.  Genitourinary:  Negative  for dysuria.  Musculoskeletal:  Negative for back pain and neck pain.       Right knee and left hand pain  Neurological:  Negative for headaches.    Physical Exam Updated Vital Signs BP 107/69 (BP Location: Right Arm)   Pulse 85   Temp 98.8 F (37.1 C) (Oral)   Resp 14   SpO2 99%  Physical Exam Vitals and nursing note reviewed.  Constitutional:      Appearance: Normal appearance. He is well-developed.  HENT:     Head: Atraumatic.     Nose: Nose normal.     Mouth/Throat:     Mouth: Mucous membranes are moist.  Eyes:     General: No scleral icterus.    Conjunctiva/sclera: Conjunctivae normal.  Neck:     Trachea: No tracheal deviation.  Cardiovascular:     Rate and Rhythm: Normal rate.     Pulses: Normal pulses.  Pulmonary:     Effort: Pulmonary effort is normal. No accessory muscle usage or respiratory distress.  Abdominal:     General: There is no distension.     Tenderness: There is no abdominal tenderness.  Genitourinary:    Comments: No cva tenderness. Musculoskeletal:     Cervical back: Neck supple.     Comments: Swelling and tenderness diffusely to right knee and left hand. Distal pulses palp bil.  Skin:    General: Skin is warm and dry.     Findings: No rash.  Neurological:     Mental Status: He is alert.     Comments: Alert, speech clear. Motor/sens grossly intact bil.   Psychiatric:        Mood and Affect: Mood normal.     ED Results / Procedures / Treatments   Labs (all labs ordered are listed, but only abnormal results are displayed) Labs Reviewed  BASIC METABOLIC PANEL - Abnormal; Notable for the following components:      Result Value   CO2 19 (*)    Glucose, Bld 120 (*)    BUN 28 (*)    Creatinine, Ser 1.36 (*)    GFR, Estimated 55 (*)    All other components within normal limits  CBC WITH DIFFERENTIAL/PLATELET  URIC ACID    EKG None  Radiology DG Knee Complete 4 Views Right  Result Date: 10/01/2021 CLINICAL DATA:  Right knee pain  and swelling, no injury, initial encounter EXAM: RIGHT KNEE - COMPLETE 4+ VIEW COMPARISON:  None Available. FINDINGS: No evidence of fracture, dislocation, or joint effusion. No evidence of arthropathy or other focal bone abnormality. Soft tissues are unremarkable. IMPRESSION: No acute abnormality noted. Electronically Signed   By: Inez Catalina M.D.   On: 10/01/2021 22:30   DG Hand Complete Left  Result Date: 10/01/2021 CLINICAL DATA:  Left hand pain and swelling EXAM: LEFT HAND - COMPLETE 3+ VIEW COMPARISON:  None Available. FINDINGS: Small radiopaque density is noted along the fifth metacarpal of uncertain chronicity. No acute fracture or dislocation is seen. No other soft tissue is noted. IMPRESSION: Radiopaque foreign body along the fifth metacarpal of uncertain chronicity. No other focal abnormality is noted. Electronically Signed   By: Inez Catalina M.D.   On: 10/01/2021 22:29    Procedures Procedures    Medications Ordered in ED Medications  predniSONE (DELTASONE) tablet 60 mg (has no administration in time range)  oxyCODONE-acetaminophen (PERCOCET/ROXICET) 5-325 MG per tablet 1 tablet (has no administration in time range)    ED Course/ Medical Decision Making/ A&P                           Medical Decision Making Problems Addressed: Acute idiopathic gout of left hand: acute illness or injury with systemic symptoms that poses a threat to life or bodily functions Acute idiopathic gout of right knee: acute illness or injury with systemic symptoms that poses a threat to life or bodily functions Chronic kidney disease, stage 3a ([place]): chronic illness or injury that poses a threat to life or bodily functions  Amount and/or Complexity of Data Reviewed External Data Reviewed: labs and notes. Labs: ordered. Decision-making details documented in ED Course. Radiology: ordered and independent interpretation performed. Decision-making details documented in ED Course.  Risk Prescription drug  management. Decision regarding hospitalization.    Labs ordered/sent. Imaging ordered.   Diff dx includes infectious arthritis/septic joint, cellulitis, gout, other inflammatory arthritis - dispo decision including potential need for admission considered - will get labs and imaging and reassess.  Reviewed nursing notes and prior charts for additional history. External reports reviewed.   Labs reviewed/interpreted by me - wbc normal.   Xrays reviewed/interpreted by me - no fx.   Pt has ride, did not drive. Percocet po. Prednisone po.  Rx for home.  Rec pcp f/u.  Return precautions provided.  Final Clinical Impression(s) / ED Diagnoses Final diagnoses:  None    Rx / DC Orders ED Discharge Orders     None         Lajean Saver, MD 10/02/21 0745

## 2021-11-15 ENCOUNTER — Ambulatory Visit (INDEPENDENT_AMBULATORY_CARE_PROVIDER_SITE_OTHER): Payer: Medicare HMO | Admitting: Nurse Practitioner

## 2021-11-15 ENCOUNTER — Encounter: Payer: Self-pay | Admitting: Nurse Practitioner

## 2021-11-15 VITALS — BP 138/88 | HR 83 | Temp 96.9°F | Resp 16 | Ht 73.0 in | Wt 214.2 lb

## 2021-11-15 DIAGNOSIS — E1165 Type 2 diabetes mellitus with hyperglycemia: Secondary | ICD-10-CM

## 2021-11-15 DIAGNOSIS — M7989 Other specified soft tissue disorders: Secondary | ICD-10-CM | POA: Diagnosis not present

## 2021-11-15 DIAGNOSIS — D472 Monoclonal gammopathy: Secondary | ICD-10-CM

## 2021-11-15 DIAGNOSIS — E119 Type 2 diabetes mellitus without complications: Secondary | ICD-10-CM

## 2021-11-15 DIAGNOSIS — I10 Essential (primary) hypertension: Secondary | ICD-10-CM

## 2021-11-15 DIAGNOSIS — M109 Gout, unspecified: Secondary | ICD-10-CM

## 2021-11-15 DIAGNOSIS — J410 Simple chronic bronchitis: Secondary | ICD-10-CM

## 2021-11-15 DIAGNOSIS — Z8739 Personal history of other diseases of the musculoskeletal system and connective tissue: Secondary | ICD-10-CM | POA: Diagnosis not present

## 2021-11-15 DIAGNOSIS — L03119 Cellulitis of unspecified part of limb: Secondary | ICD-10-CM

## 2021-11-15 LAB — COMPREHENSIVE METABOLIC PANEL
ALT: 23 U/L (ref 0–53)
AST: 22 U/L (ref 0–37)
Albumin: 3.8 g/dL (ref 3.5–5.2)
Alkaline Phosphatase: 65 U/L (ref 39–117)
BUN: 19 mg/dL (ref 6–23)
CO2: 23 mEq/L (ref 19–32)
Calcium: 9.3 mg/dL (ref 8.4–10.5)
Chloride: 105 mEq/L (ref 96–112)
Creatinine, Ser: 0.98 mg/dL (ref 0.40–1.50)
GFR: 76.14 mL/min (ref >60.00)
Glucose, Bld: 110 mg/dL — ABNORMAL HIGH (ref 70–99)
Potassium: 4 mEq/L (ref 3.5–5.1)
Sodium: 138 mEq/L (ref 135–145)
Total Bilirubin: 0.4 mg/dL (ref 0.2–1.2)
Total Protein: 7.5 g/dL (ref 6.0–8.3)

## 2021-11-15 LAB — POCT GLYCOSYLATED HEMOGLOBIN (HGB A1C): Hemoglobin A1C: 5.7 % — AB (ref 4.0–5.6)

## 2021-11-15 LAB — URIC ACID: Uric Acid, Serum: 5.9 mg/dL (ref 4.0–7.8)

## 2021-11-15 LAB — CBC
HCT: 38.1 % — ABNORMAL LOW (ref 39.0–52.0)
Hemoglobin: 12.6 g/dL — ABNORMAL LOW (ref 13.0–17.0)
MCHC: 33.2 g/dL (ref 30.0–36.0)
MCV: 96.6 fl (ref 78.0–100.0)
Platelets: 336 10*3/uL (ref 150.0–400.0)
RBC: 3.94 Mil/uL — ABNORMAL LOW (ref 4.22–5.81)
RDW: 15.2 % (ref 11.5–15.5)
WBC: 9 10*3/uL (ref 4.0–10.5)

## 2021-11-15 LAB — MICROALBUMIN / CREATININE URINE RATIO
Creatinine,U: 94.1 mg/dL
Microalb Creat Ratio: 7.8 mg/g (ref 0.0–30.0)
Microalb, Ur: 7.4 mg/dL — ABNORMAL HIGH (ref 0.0–1.9)

## 2021-11-15 MED ORDER — ALBUTEROL SULFATE HFA 108 (90 BASE) MCG/ACT IN AERS: 2.0000 | INHALATION_SPRAY | Freq: Four times a day (QID) | RESPIRATORY_TRACT | 3 refills | Status: DC | PRN

## 2021-11-15 MED ORDER — AMLODIPINE BESYLATE 5 MG PO TABS: 5.0000 mg | ORAL_TABLET | Freq: Every day | ORAL | 11 refills | Status: DC

## 2021-11-15 MED ORDER — CEFTRIAXONE SODIUM 1 G IJ SOLR
1.0000 g | Freq: Once | INTRAMUSCULAR | Status: AC
  Administered 2021-11-15: 1 g via INTRAMUSCULAR

## 2021-11-15 MED ORDER — COLCHICINE 0.6 MG PO TABS: ORAL_TABLET | ORAL | 0 refills | Status: DC

## 2021-11-15 MED ORDER — DOXYCYCLINE HYCLATE 100 MG PO TABS: 100.0000 mg | ORAL_TABLET | Freq: Two times a day (BID) | ORAL | 0 refills | Status: AC

## 2021-11-15 MED ORDER — ALLOPURINOL 100 MG PO TABS: 100.0000 mg | ORAL_TABLET | Freq: Every day | ORAL | 1 refills | Status: DC

## 2021-11-15 NOTE — Patient Instructions (Signed)
Nice to see you today We gave you a dose of antibiotics in the butt today I also sent in antibiotics to the pharmacy you can start taking them today. I want to see you in 2 days, sooner if you get worse  DO NOT TAKE the allopurinol or colchicine until you hear from me or my nurse

## 2021-11-15 NOTE — Assessment & Plan Note (Signed)
Administer Rocephin 1 g IM x1 dose.  Patient placed on doxycycline 100 mg twice daily for 7 days.  Patient will have close follow-up in 2 days sooner if needed

## 2021-11-15 NOTE — Assessment & Plan Note (Signed)
Current everyday smoker.  Patient does use albuterol inhaler on a as needed basis.  Will need an LDCT in the future.  Refill albuterol inhaler today

## 2021-11-15 NOTE — Progress Notes (Signed)
Established Patient Office Visit  Subjective   Patient ID: John Blake, male    DOB: 1945-06-06  Age: 76 y.o. MRN: [id number]  Chief Complaint  Patient presents with   Transfer of Care    Previous PCP Dr Orland Mustard   Hand Pain    Right hand swelling-started about 2 weeks ago. Redness present.Very painful. Tylenol helps maybe an hour or 2. Not taking anything for gout, out of refills.    Joint Swelling    Left elbow swelling, redness.       HTN: States that he does not check his blood pressure at hoem. Does not have cuff. States he is out of the amlodipine.  COPD: Uses albuterol as needed. States that he used it once every other day. Smokes still. 0.5 ppd for appox 50 years  DM2: States that he was borderline no anti diabetic medicine. 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 States no trouble with low sugars  CKD:  MGUS: Followed by oncology every 6 months  HLD: States that he eats well except over the past few weeks. Since his wife passed away.  Hand: States that it started 1.5 weeks ago. States that the hand is swollen, decreased ROM. Taking tylenol that helps some. States it last a few hours relekf. If he holds it up helps with some of the discomfort.   Elbow States that it happened approx 1.5 weeks ago. States the elbow started first.  No injury. Red swollen and tender.     Review of Systems  Constitutional:  Negative for chills and fever.  Respiratory:  Negative for shortness of breath.   Cardiovascular:  Negative for chest pain.  Gastrointestinal:  Negative for abdominal pain, diarrhea, nausea and vomiting.       BM once a day  Genitourinary:  Negative for dysuria and hematuria.  Musculoskeletal:  Positive for joint pain.  Neurological:  Negative for tingling.      Objective:     BP 138/88   Pulse 83   Temp (!) 96.9 F (36.1 C) (Temporal)   Resp 16   Ht '6\' 1"'$  (1.854 m)   Wt 214 lb 4 oz (97.2 kg)   SpO2 97%   BMI 28.27 kg/m     Physical Exam Vitals and nursing note reviewed.  Constitutional:      Appearance: Normal appearance. He is obese.  Cardiovascular:     Rate and Rhythm: Normal rate and regular rhythm.     Heart sounds: Normal heart sounds.  Pulmonary:     Effort: Pulmonary effort is normal.     Breath sounds: Normal breath sounds.  Musculoskeletal:        General: Tenderness present.     Right lower leg: No edema.     Left lower leg: No edema.  Skin:    Capillary Refill: Capillary refill takes less than 2 seconds.     Findings: Erythema present.       Neurological:     Mental Status: He is alert.    Diabetic Foot Form - Detailed   Diabetic Foot Exam - detailed Is there swelling or and abnormal foot shape?: No Is there a claw toe deformity?: No Is there elevated skin temparature?: No Pulse Foot Exam completed.: Yes   Right posterior Tibialias: Present Left posterior Tibialias: Present   Right Dorsalis Pedis: Present Left Dorsalis Pedis: Present  Sensory Foot Exam Completed.: Yes Semmes-Weinstein Monofilament Test   Comments: All 10 sites bilaterally  sensation intact      No results found for any visits on 11/15/21.    The ASCVD Risk score (Arnett DK, et al., 2019) failed to calculate for the following reasons:   Cannot find a previous HDL lab   Cannot find a previous total cholesterol lab    Assessment & Plan:   Problem List Items Addressed This Visit       Cardiovascular and Mediastinum   Essential hypertension    Patient currently maintained on amlodipine 5 mg.  Patient's been on medication for a few weeks per his report.  Blood pressure within goal we will restart amlodipine 5 mg.  Prescription sent to pharmacy      Relevant Medications   amLODipine (NORVASC) 5 MG tablet     Respiratory   COPD (chronic obstructive pulmonary disease) (HCC)    Current everyday smoker.  Patient does use albuterol inhaler on a as needed basis.  Will need an LDCT in the future.   Refill albuterol inhaler today      Relevant Medications   albuterol (VENTOLIN HFA) 108 (90 Base) MCG/ACT inhaler     Endocrine   Diet-controlled type 2 diabetes mellitus ([place]) - Primary    Patient's A1c was 5.7 in office today.  Patient well controlled with diet and lifestyle modifications.      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  Continue following with oncology as recommended      History of gout    Pending uric acid level today.      Relevant Orders   Uric acid   Cellulitis of upper extremity    Administer Rocephin 1 g IM x1 dose.  Patient placed on doxycycline 100 mg twice daily for 7 days.  Patient will have close follow-up in 2 days sooner if needed      Relevant Medications   doxycycline (VIBRA-TABS) 100 MG tablet   Other Relevant Orders   CBC   Comprehensive metabolic panel   Swelling of right hand    Secondary to either acute gout flare or cellulitis.  Pending blood work administer Rocephin 1 g IM with 2-day follow-up.  Pending uric acid level patient will hold off on allopurinol and colchicine until further instructions      Other Visit Diagnoses     Gouty arthritis       Relevant Medications   allopurinol (ZYLOPRIM) 100 MG tablet   colchicine 0.6 MG tablet       Return in about 2 days (around 11/17/2021) for Celluitis recheck .    Romilda Garret, NP

## 2021-11-15 NOTE — Assessment & Plan Note (Signed)
Secondary to either acute gout flare or cellulitis.  Pending blood work administer Rocephin 1 g IM with 2-day follow-up.  Pending uric acid level patient will hold off on allopurinol and colchicine until further instructions

## 2021-11-15 NOTE — Assessment & Plan Note (Signed)
Patient is followed approximately every 6 months by oncology.  Continue following with oncology as recommended

## 2021-11-15 NOTE — Assessment & Plan Note (Signed)
Patient's A1c was 5.7 in office today.  Patient well controlled with diet and lifestyle modifications.

## 2021-11-15 NOTE — Assessment & Plan Note (Signed)
Pending uric acid level today.

## 2021-11-15 NOTE — Assessment & Plan Note (Signed)
Patient currently maintained on amlodipine 5 mg.  Patient's been on medication for a few weeks per his report.  Blood pressure within goal we will restart amlodipine 5 mg.  Prescription sent to pharmacy

## 2021-11-17 ENCOUNTER — Ambulatory Visit (INDEPENDENT_AMBULATORY_CARE_PROVIDER_SITE_OTHER): Payer: Medicare HMO | Admitting: Nurse Practitioner

## 2021-11-17 VITALS — BP 150/76 | HR 114 | Temp 97.0°F | Resp 16 | Ht 73.0 in | Wt 213.4 lb

## 2021-11-17 DIAGNOSIS — L03113 Cellulitis of right upper limb: Secondary | ICD-10-CM

## 2021-11-17 DIAGNOSIS — M7989 Other specified soft tissue disorders: Secondary | ICD-10-CM | POA: Diagnosis not present

## 2021-11-17 MED ORDER — PREDNISONE 20 MG PO TABS: ORAL_TABLET | ORAL | 0 refills | Status: AC

## 2021-11-17 MED ORDER — CEFTRIAXONE SODIUM 1 G IJ SOLR
1.0000 g | Freq: Once | INTRAMUSCULAR | Status: AC
  Administered 2021-11-17: 1 g via INTRAMUSCULAR

## 2021-11-17 MED ORDER — OXYCODONE HCL 5 MG PO TABS: 5.0000 mg | ORAL_TABLET | Freq: Four times a day (QID) | ORAL | 0 refills | Status: DC | PRN

## 2021-11-17 NOTE — Progress Notes (Signed)
   Established Patient Office Visit  Subjective   Patient ID: John Blake, male    DOB: 07/02/1945  Age: 76 y.o. MRN: [id number]  Chief Complaint  Patient presents with   cellulitis follow up    Redness is a little better in right hand still swollen and painful    HPI  Cellulitis: Patient was seen by me on 11/15/2021 with hand swelling and elbow swelling and redness. He was given an injection of rocephin and placed on doxycycline. He is here for a recheck.  Patient has noticed very small improvement since last office visit.  States he still having the discomfort In the right hand.  Still having decreased range of motion and swelling.  States swelling looks like it might have improved a little bit.  In his left elbow redness is improved some it does feel better from a pain standpoint.  Patient is currently taking the doxycycline as prescribed.      Review of Systems  Constitutional:  Negative for chills and fever.  Musculoskeletal:  Positive for joint pain.  Skin:        "+" swelling  Neurological:  Negative for tingling.      Objective:     BP (!) 150/76   Pulse (!) 114   Temp (!) 97 F (36.1 C) (Temporal)   Resp 16   Ht '6\' 1"'$  (1.854 m)   Wt 213 lb 6 oz (96.8 kg)   SpO2 99%   BMI 28.15 kg/m    Physical Exam Vitals and nursing note reviewed.  Constitutional:      Appearance: Normal appearance. He is obese.  Musculoskeletal:     Comments: 19 cm left  22cm left wrist   Skin:    Capillary Refill: Capillary refill takes less than 2 seconds.  Neurological:     Mental Status: He is alert.         No results found for any visits on 11/17/21.    The ASCVD Risk score (Arnett DK, et al., 2019) failed to calculate for the following reasons:   Cannot find a previous HDL lab   Cannot find a previous total cholesterol lab    Assessment & Plan:   Problem List Items Addressed This Visit       Other   Swelling of right hand - Primary    Modest  improvement.  We will place patient on prednisone 40 mg taper.  Prednisone precautions reviewed.  Close follow-up in office      Relevant Medications   oxyCODONE (ROXICODONE) 5 MG immediate release tablet   predniSONE (DELTASONE) 20 MG tablet   Cellulitis of right hand    Administer another injection of 1 g Rocephin IM x1 dose today.  Patient to continue doxycycline 100 mg twice daily until prescription is complete.  We will send in some oxycodone 5 mg 4 times daily as needed for a few days for severe pain management.  Patient will have close follow-up in office.  Signs symptoms reviewed when to seek urgent emergent healthcare over the weekend.       Return in about 4 days (around 11/21/2021) for Cellulitis recheck .    Romilda Garret, NP

## 2021-11-17 NOTE — Patient Instructions (Signed)
Nice to see you Continue taking the antibiotic pills I sent in some pain medication as needed for severe pain  I want to see you back in office on Monday

## 2021-11-17 NOTE — Assessment & Plan Note (Signed)
Administer another injection of 1 g Rocephin IM x1 dose today.  Patient to continue doxycycline 100 mg twice daily until prescription is complete.  We will send in some oxycodone 5 mg 4 times daily as needed for a few days for severe pain management.  Patient will have close follow-up in office.  Signs symptoms reviewed when to seek urgent emergent healthcare over the weekend.

## 2021-11-17 NOTE — Assessment & Plan Note (Signed)
Modest improvement.  We will place patient on prednisone 40 mg taper.  Prednisone precautions reviewed.  Close follow-up in office

## 2021-11-21 ENCOUNTER — Ambulatory Visit (INDEPENDENT_AMBULATORY_CARE_PROVIDER_SITE_OTHER)
Admission: RE | Admit: 2021-11-21 | Discharge: 2021-11-21 | Disposition: A | Discharge status: Home / Self Care | Payer: Medicare HMO | Source: Ambulatory Visit | Attending: Nurse Practitioner | Admitting: Nurse Practitioner

## 2021-11-21 ENCOUNTER — Ambulatory Visit (INDEPENDENT_AMBULATORY_CARE_PROVIDER_SITE_OTHER): Payer: Medicare HMO | Admitting: Nurse Practitioner

## 2021-11-21 ENCOUNTER — Encounter: Payer: Self-pay | Admitting: Nurse Practitioner

## 2021-11-21 VITALS — BP 138/70 | HR 100 | Temp 96.6°F | Resp 18 | Ht 73.0 in | Wt 220.4 lb

## 2021-11-21 DIAGNOSIS — L03113 Cellulitis of right upper limb: Secondary | ICD-10-CM | POA: Diagnosis not present

## 2021-11-21 DIAGNOSIS — M7989 Other specified soft tissue disorders: Secondary | ICD-10-CM | POA: Diagnosis not present

## 2021-11-21 DIAGNOSIS — I10 Essential (primary) hypertension: Secondary | ICD-10-CM | POA: Diagnosis not present

## 2021-11-21 DIAGNOSIS — M25422 Effusion, left elbow: Secondary | ICD-10-CM | POA: Diagnosis not present

## 2021-11-21 NOTE — Progress Notes (Signed)
Established Patient Office Visit  Subjective   Patient ID: John Blake, male    DOB: 10/17/1945  Age: 76 y.o. MRN: [id number]  Chief Complaint  Patient presents with   cellulitis follow up    Right hand is better, left elbow is better-some dry and peeling skin    HPI  Cellutlitis followup: Patient was seen by me on 11/15/2021. He was given an injection of 1g reocephin and placed on doxycycline. He saw me again on 11/17/2021 and was given another injection of rocephin and placed on steroids while he was on the doxycycline. He is here for another follow up on the condition   States that he has made a good improvement. States that he is able to move his fingers some now. Still having trouble writing.  Patient has approximately 2 days left of doxycycline antibiotic and approximate 4 days left of steroid.  Patient also got refills of allopurinol and colchicine but has not started taking either of those medications thus far.    Review of Systems  Constitutional:  Negative for chills and fever.  Respiratory:  Negative for shortness of breath.   Cardiovascular:  Negative for chest pain.  Neurological:  Positive for focal weakness. Negative for tingling.      Objective:     BP 138/70   Pulse 100   Temp (!) 96.6 F (35.9 C) (Temporal)   Resp 18   Ht '6\' 1"'$  (1.854 m)   Wt 220 lb 6 oz (100 kg)   SpO2 98%   BMI 29.07 kg/m  BP Readings from Last 3 Encounters:  11/21/21 138/70  11/17/21 (!) 150/76  11/15/21 138/88   Wt Readings from Last 3 Encounters:  11/21/21 220 lb 6 oz (100 kg)  11/17/21 213 lb 6 oz (96.8 kg)  11/15/21 214 lb 4 oz (97.2 kg)      Physical Exam Vitals and nursing note reviewed.  Constitutional:      Appearance: Normal appearance.  Cardiovascular:     Rate and Rhythm: Normal rate and regular rhythm.     Heart sounds: Normal heart sounds.  Pulmonary:     Effort: Pulmonary effort is normal.     Breath sounds: Normal breath sounds.  Musculoskeletal:         General: Swelling and tenderness present.  Neurological:     Mental Status: He is alert.         No results found for any visits on 11/21/21.    The ASCVD Risk score (Arnett DK, et al., 2019) failed to calculate for the following reasons:   Cannot find a previous HDL lab   Cannot find a previous total cholesterol lab    Assessment & Plan:   Problem List Items Addressed This Visit       Cardiovascular and Mediastinum   Essential hypertension    Blood pressure within normal limits on recheck in office.  Continue taking medication as prescribed        Other   Swelling of right hand - Primary    Drastic improvement on oral antibiotics and oral steroids.  Encourage patient to complete both courses of medication follow-up if he does not continue to improve      Relevant Orders   DG Hand Complete Right   Cellulitis of right hand    Dramatic improvement with oral antibiotics and oral steroids.  Follow-up if you do not continue to improve finished both medication regimens as prescribed and recommended.  Relevant Orders   DG Hand Complete Right   Elbow swelling, left    Dramatic improvement with use of oral antibiotics and steroids.  Continue taking both.  Follow-up if it does not continue to improve or get to complete resolution.       Return in about 6 months (around 05/22/2022) for DM/HTN recheck.    Romilda Garret, NP

## 2021-11-21 NOTE — Assessment & Plan Note (Addendum)
Blood pressure within normal limits on recheck in office.  Continue taking medication as prescribed

## 2021-11-21 NOTE — Assessment & Plan Note (Signed)
Dramatic improvement with use of oral antibiotics and steroids.  Continue taking both.  Follow-up if it does not continue to improve or get to complete resolution.

## 2021-11-21 NOTE — Assessment & Plan Note (Signed)
Dramatic improvement with oral antibiotics and oral steroids.  Follow-up if you do not continue to improve finished both medication regimens as prescribed and recommended.

## 2021-11-21 NOTE — Assessment & Plan Note (Signed)
Drastic improvement on oral antibiotics and oral steroids.  Encourage patient to complete both courses of medication follow-up if he does not continue to improve

## 2021-11-21 NOTE — Patient Instructions (Signed)
Nice to see you today You DO NOT have to start taking the allopurinol. If you have a gout flare you can use the colchicine as needed  If you hand does not continue to get better over the next 7 days I need to know and we can send you to a hand specialist   I want ot see you in 6 months for a normal check up

## 2021-11-22 ENCOUNTER — Telehealth: Payer: Self-pay | Admitting: Nurse Practitioner

## 2021-11-22 NOTE — Telephone Encounter (Signed)
Is patient ok to continue Tylenol 325 mg as needed due to kidney function?

## 2021-11-22 NOTE — Telephone Encounter (Signed)
Orit a Emergency planning/management officer called with some questions regarding the pt's meds, acetaminophen (TYLENOL) 325 MG tablet. Orit stated the pt has some kidney issues. Orit stated the kidney level was GFR - 55. Call back # [phone].

## 2021-11-23 NOTE — Telephone Encounter (Signed)
Just checked his kidney function 8 days ago  GFR is: 76.14  Creatinine is: 0.98  I think it is fine for him to continue the tylenol '325mg'$  as needed for aches and pains

## 2021-11-23 NOTE — Telephone Encounter (Signed)
[facility] back, the message that was taking below is not the reason they called. Called to ask if provider would want to start patient on ARB or Ace inhibitor due to Diabetes and GFR below 60. Advised that patient is on Amlodipine 5 mg daily. Nothing further is needed at this time

## 2021-11-25 ENCOUNTER — Telehealth: Payer: Self-pay | Admitting: Nurse Practitioner

## 2021-11-25 NOTE — Telephone Encounter (Signed)
Patient called and was returning a call back.

## 2021-11-25 NOTE — Telephone Encounter (Signed)
See xray result note, spoke with patient

## 2021-12-22 ENCOUNTER — Inpatient Hospital Stay: Payer: Medicare HMO | Attending: Registered Nurse

## 2021-12-29 ENCOUNTER — Inpatient Hospital Stay: Payer: Medicare HMO | Admitting: Oncology

## 2022-01-31 ENCOUNTER — Telehealth: Payer: Self-pay | Admitting: Oncology

## 2022-01-31 IMAGING — US US EXTREM UP *R* LTD
1 series · 14 of 15 positions shown · non-contrast
Comparison: None

CLINICAL DATA: Right hand pain and swelling.

EXAM:
ULTRASOUND right UPPER EXTREMITY LIMITED
TECHNIQUE: Ultrasound examination of the upper extremity soft tissues was
performed in the area of clinical concern.

[Series 1: us soft tissue right upper extremity limited (non- · 15 acquisitions, 14 frames shown]
[im 1/15]
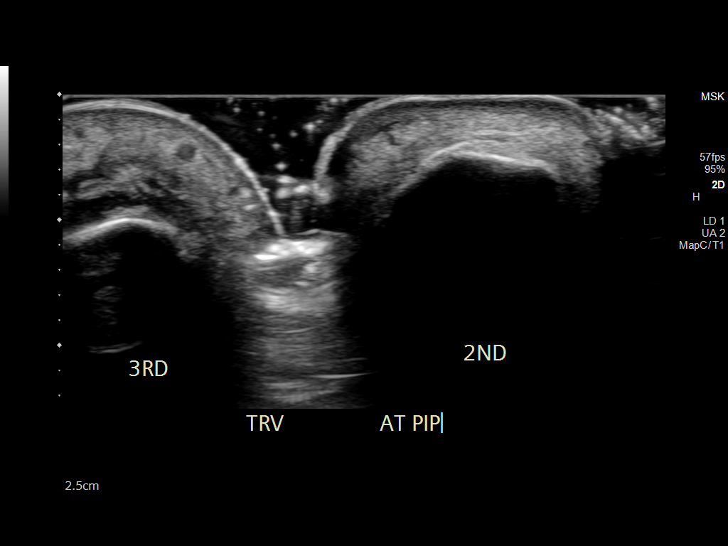
[im 2/15]
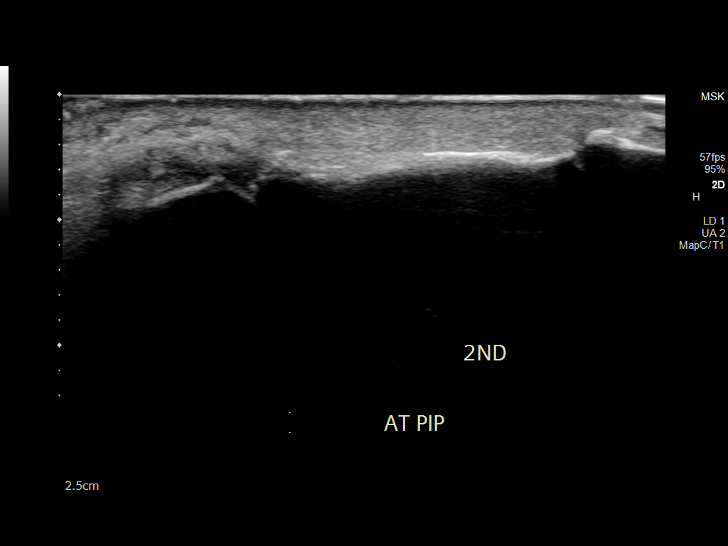
[im 3/15]
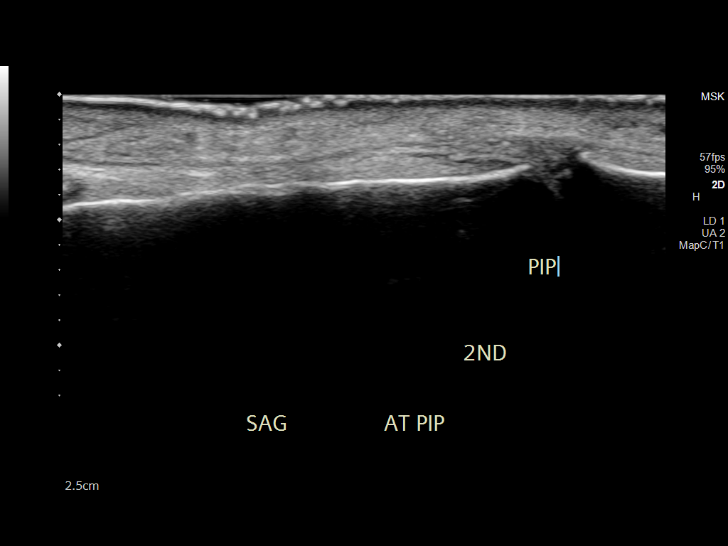
[im 4/15]
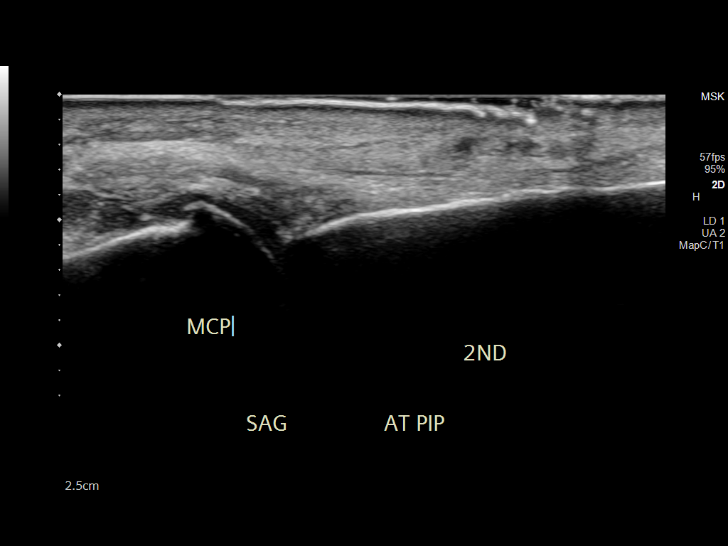
[im 5/15]
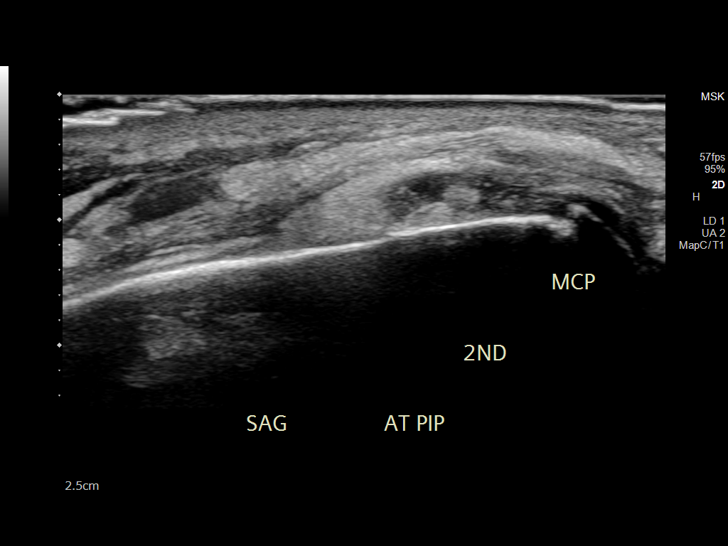
[im 6/15]
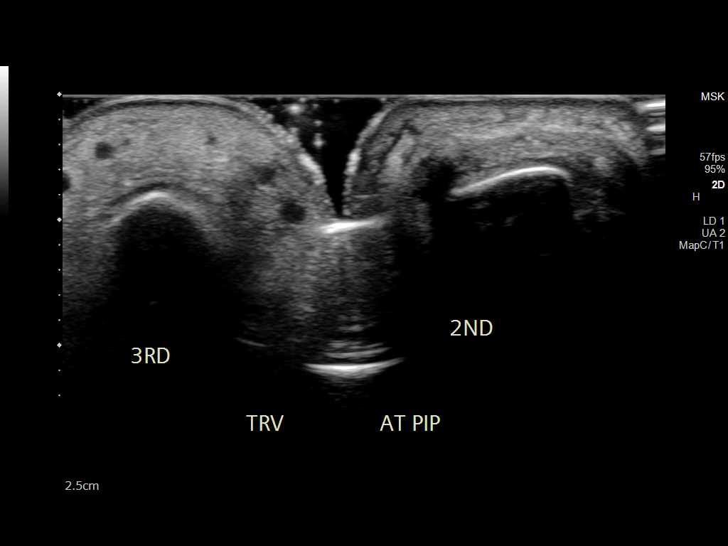
[im 7/15]
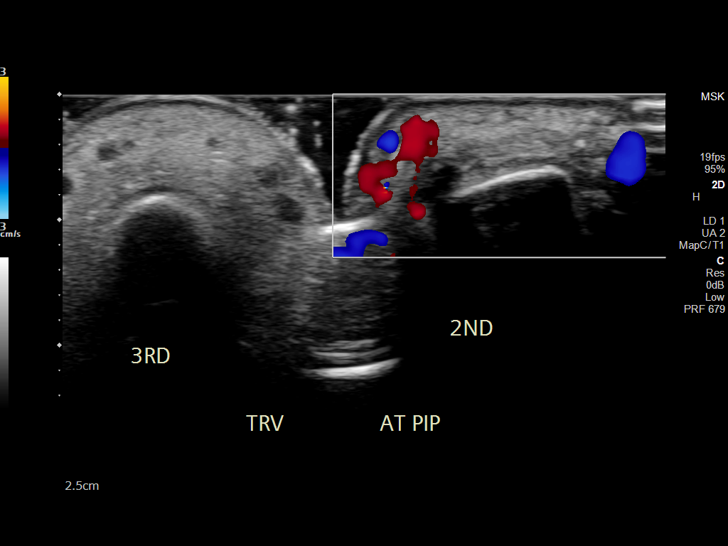
[im 9/15]
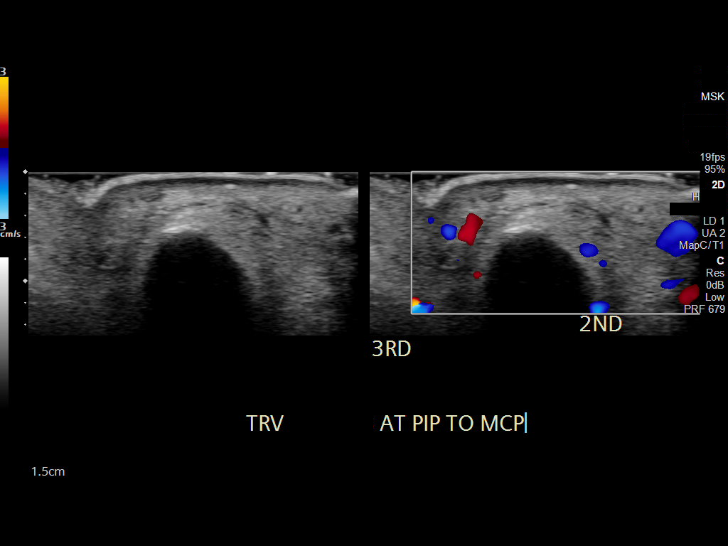
[im 10/15]
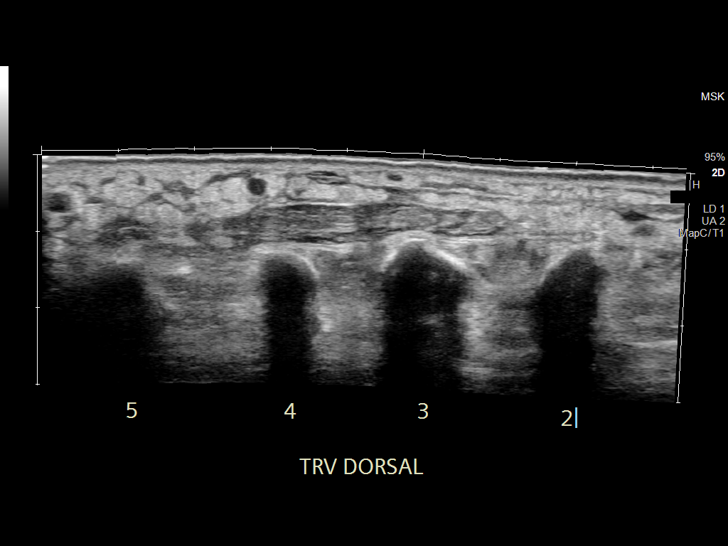
[im 11/15]
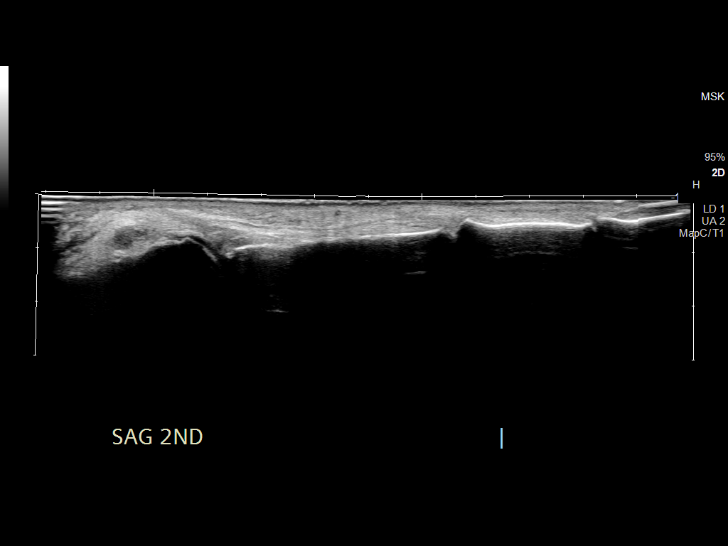
[im 12/15]
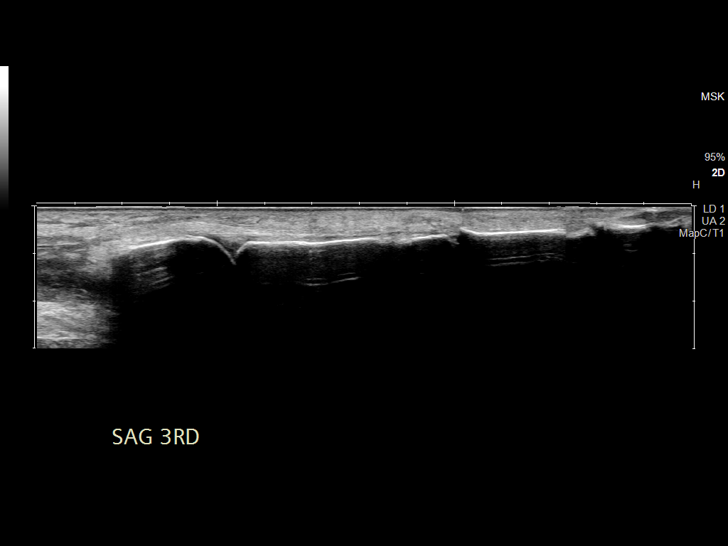
[im 13/15]
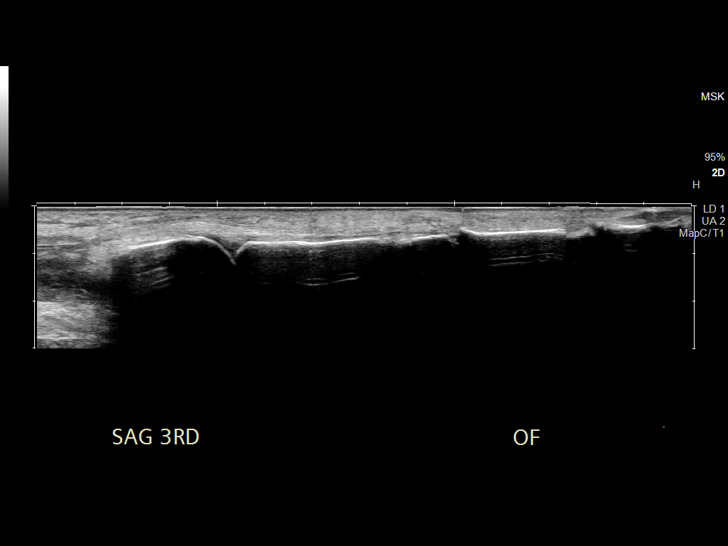
[im 14/15]
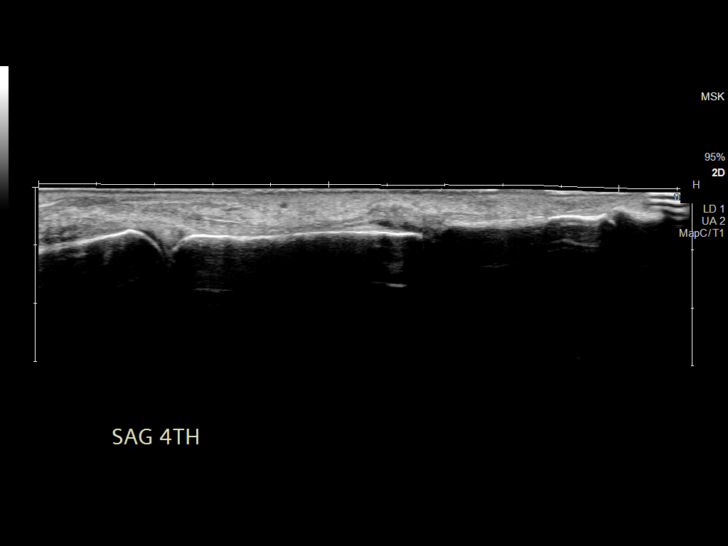
[im 15/15]
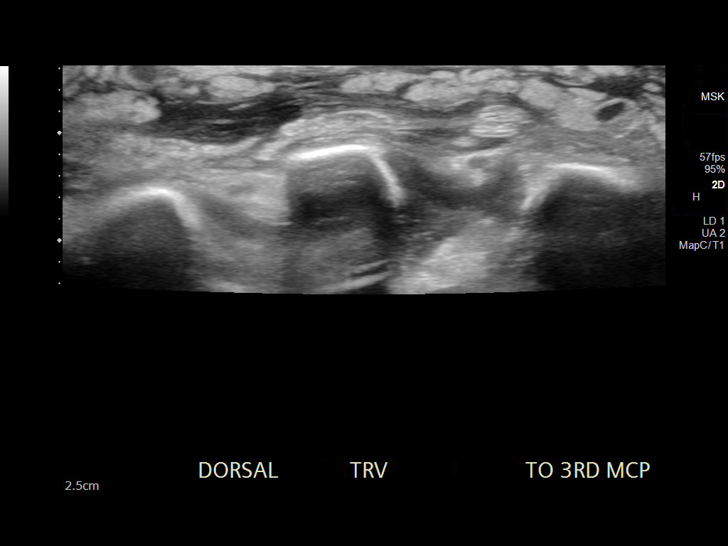

[14 of 15 positions shown; findings below may reference images not displayed]

FINDINGS: Diffuse subcutaneous soft tissue swelling/edema/fluid along the
dorsal aspect of the hand. Most notably over the index finger.
Findings suggest diffuse cellulitis. No discrete drainable fluid
collection to suggest an abscess. No findings for tenosynovitis.
IMPRESSION: Diffuse subcutaneous soft tissue swelling suggesting cellulitis.

No discrete abscess.

## 2022-01-31 NOTE — Telephone Encounter (Signed)
Called patient per Dr. Alen Blew transition. Left voicemail for patient to call us back.

## 2022-02-10 ENCOUNTER — Telehealth: Payer: Self-pay | Admitting: Nurse Practitioner

## 2022-02-10 NOTE — Telephone Encounter (Signed)
Prescription Request  02/10/2022  Is this a "Controlled Substance" medicine? No  LOV: 11/21/2021  What is the name of the medication or equipment? albuterol (VENTOLIN HFA) 108 (90 Base) MCG/ACT inhaler   Have you contacted your pharmacy to request a refill? No   Which pharmacy would you like this sent to?  [id number] [address]Phone: [phone]: [phone]     Patient notified that their request is being sent to the clinical staff for review and that they should receive a response within 2 business days.   Please advise at Mobile [phone](mobile)

## 2022-02-10 NOTE — Telephone Encounter (Signed)
Called patient to inquire about HFA use. He states he has not picked up any refills since the original fill 10/2021. Advised patient [facility], he will contact them to arrange pick up. Nothing further needed at this time.

## 2022-04-25 ENCOUNTER — Other Ambulatory Visit: Payer: Self-pay | Admitting: Nurse Practitioner

## 2022-04-25 DIAGNOSIS — J410 Simple chronic bronchitis: Secondary | ICD-10-CM

## 2022-05-17 ENCOUNTER — Other Ambulatory Visit: Payer: Self-pay | Admitting: Nurse Practitioner

## 2022-05-17 DIAGNOSIS — M109 Gout, unspecified: Secondary | ICD-10-CM

## 2022-05-22 ENCOUNTER — Encounter: Payer: Self-pay | Admitting: Nurse Practitioner

## 2022-05-22 ENCOUNTER — Ambulatory Visit (INDEPENDENT_AMBULATORY_CARE_PROVIDER_SITE_OTHER): Payer: Medicare HMO | Admitting: Nurse Practitioner

## 2022-05-22 VITALS — BP 146/80 | HR 94 | Temp 98.6°F | Resp 16 | Ht 73.0 in | Wt 232.0 lb

## 2022-05-22 DIAGNOSIS — J449 Chronic obstructive pulmonary disease, unspecified: Secondary | ICD-10-CM | POA: Diagnosis not present

## 2022-05-22 DIAGNOSIS — E119 Type 2 diabetes mellitus without complications: Secondary | ICD-10-CM

## 2022-05-22 DIAGNOSIS — I10 Essential (primary) hypertension: Secondary | ICD-10-CM | POA: Diagnosis not present

## 2022-05-22 DIAGNOSIS — E1122 Type 2 diabetes mellitus with diabetic chronic kidney disease: Secondary | ICD-10-CM | POA: Diagnosis not present

## 2022-05-22 LAB — POCT GLYCOSYLATED HEMOGLOBIN (HGB A1C): Hemoglobin A1C: 5.8 % — AB (ref 4.0–5.6)

## 2022-05-22 MED ORDER — FLUTICASONE-SALMETEROL 250-50 MCG/ACT IN AEPB: 1.0000 | INHALATION_SPRAY | Freq: Two times a day (BID) | RESPIRATORY_TRACT | 11 refills | Status: DC

## 2022-05-22 NOTE — Assessment & Plan Note (Signed)
Patient currently controlled maintained on lifestyle modifications slowly.  A1c today of 5.8%.  Continue without medication we will follow-up in 6 months if still normal patient can follow-up in 1 year thereafter

## 2022-05-22 NOTE — Assessment & Plan Note (Signed)
Patient currently maintained on amlodipine 5 mg.  Patient has not taken his antihypertensive medications today blood pressure slightly above goal encourage patient to take blood pressure medication when he gets home.

## 2022-05-22 NOTE — Assessment & Plan Note (Signed)
History of the same.  With increased albuterol use we will send in Wixela inhaler patient will call and let me know if the inhaler is financially unobtainable and we can switch medications.

## 2022-05-22 NOTE — Patient Instructions (Signed)
Nice to see you today I have sent in an inhaler to take everyday. If it cost too much leave it at the pharmacy and let me know I want to see you in 6 months for your physical, sooner if you need me  Take your blood pressure medication when you get home

## 2022-05-22 NOTE — Progress Notes (Signed)
Established Patient Office Visit  Subjective   Patient ID: John Blake, male    DOB: Aug 02, 1945  Age: 77 y.o. MRN: [id number]  Chief Complaint  Patient presents with   Diabetes      ZDG:UYQI not check bp at home. Does not have a cuff. Patient is currently on amlodipine. States that he has been taking his medications as directed. States that he has been having some dizziness. Unsure of when it happens. States that after he has coffee. Statse that it is when he gets up. It can last a couple minutes and will go away. No falls    COPD: albuterol as needed. States that he is still smoking. States that he is doing approx 4 cigs a day. States that he feels short winded. States that he uses the albuterol inhaler 2-3 times a day   DM2: history of the same patient is not on any anti diabetic medications. He does not check his sugar at home.     Review of Systems  Constitutional:  Negative for chills and fever.  Respiratory:  Positive for shortness of breath (DOE.).   Cardiovascular:  Negative for chest pain.  Neurological:  Positive for dizziness. Negative for headaches.  Psychiatric/Behavioral:  Negative for hallucinations and suicidal ideas.       Objective:     BP (!) 146/80   Pulse 94   Temp 98.6 F (37 C)   Resp 16   Ht 6\' 1"  (1.854 m)   Wt 232 lb (105.2 kg)   SpO2 98%   BMI 30.61 kg/m  BP Readings from Last 3 Encounters:  05/22/22 (!) 146/80  11/21/21 138/70  11/17/21 (!) 150/76   Wt Readings from Last 3 Encounters:  05/22/22 232 lb (105.2 kg)  11/21/21 220 lb 6 oz (100 kg)  11/17/21 213 lb 6 oz (96.8 kg)      Physical Exam Vitals and nursing note reviewed.  Constitutional:      Appearance: Normal appearance.  Cardiovascular:     Rate and Rhythm: Normal rate and regular rhythm.     Pulses:          Dorsalis pedis pulses are 2+ on the right side and 2+ on the left side.       Posterior tibial pulses are 2+ on the right side and 2+ on the left side.      Heart sounds: Normal heart sounds.  Pulmonary:     Effort: Pulmonary effort is normal.     Breath sounds: Rhonchi present.  Musculoskeletal:     Right lower leg: No edema.     Left lower leg: No edema.  Feet:     Right foot:     Skin integrity: Skin integrity normal.     Left foot:     Skin integrity: Skin integrity normal.  Neurological:     Mental Status: He is alert.      Results for orders placed or performed in visit on 05/22/22  POCT glycosylated hemoglobin (Hb A1C)  Result Value Ref Range   Hemoglobin A1C 5.8 (A) 4.0 - 5.6 %   HbA1c POC (<> result, manual entry)     HbA1c, POC (prediabetic range)     HbA1c, POC (controlled diabetic range)        The ASCVD Risk score (Arnett DK, et al., 2019) failed to calculate for the following reasons:   Cannot find a previous HDL lab   Cannot find a previous total cholesterol lab  Assessment & Plan:   Problem List Items Addressed This Visit       Cardiovascular and Mediastinum   Essential hypertension    Patient currently maintained on amlodipine 5 mg.  Patient has not taken his antihypertensive medications today blood pressure slightly above goal encourage patient to take blood pressure medication when he gets home.        Respiratory   COPD (chronic obstructive pulmonary disease) (HCC)    History of the same.  With increased albuterol use we will send in Wixela inhaler patient will call and let me know if the inhaler is financially unobtainable and we can switch medications.      Relevant Medications   fluticasone-salmeterol (WIXELA INHUB) 250-50 MCG/ACT AEPB     Endocrine   Diet-controlled type 2 diabetes mellitus (HCC) - Primary    Patient currently controlled maintained on lifestyle modifications slowly.  A1c today of 5.8%.  Continue without medication we will follow-up in 6 months if still normal patient can follow-up in 1 year thereafter      Relevant Orders   POCT glycosylated hemoglobin (Hb A1C)  (Completed)    Return in about 6 months (around 11/22/2022) for CPE and Labs.    Audria Nine, NP

## 2022-06-13 ENCOUNTER — Ambulatory Visit (INDEPENDENT_AMBULATORY_CARE_PROVIDER_SITE_OTHER): Payer: Medicare HMO | Admitting: Nurse Practitioner

## 2022-06-13 ENCOUNTER — Encounter: Payer: Self-pay | Admitting: Nurse Practitioner

## 2022-06-13 VITALS — BP 142/76 | HR 88 | Temp 98.1°F | Resp 16 | Ht 73.0 in | Wt 232.2 lb

## 2022-06-13 DIAGNOSIS — K047 Periapical abscess without sinus: Secondary | ICD-10-CM | POA: Diagnosis not present

## 2022-06-13 DIAGNOSIS — K0889 Other specified disorders of teeth and supporting structures: Secondary | ICD-10-CM | POA: Diagnosis not present

## 2022-06-13 LAB — CBC
HCT: 43.7 % (ref 39.0–52.0)
Hemoglobin: 14.4 g/dL (ref 13.0–17.0)
MCHC: 33 g/dL (ref 30.0–36.0)
MCV: 99.2 fl (ref 78.0–100.0)
Platelets: 229 10*3/uL (ref 150.0–400.0)
RBC: 4.4 Mil/uL (ref 4.22–5.81)
RDW: 13.8 % (ref 11.5–15.5)
WBC: 8.8 10*3/uL (ref 4.0–10.5)

## 2022-06-13 LAB — BASIC METABOLIC PANEL
BUN: 27 mg/dL — ABNORMAL HIGH (ref 6–23)
CO2: 23 mEq/L (ref 19–32)
Calcium: 9.2 mg/dL (ref 8.4–10.5)
Chloride: 104 mEq/L (ref 96–112)
Creatinine, Ser: 1.21 mg/dL (ref 0.40–1.50)
GFR: 58.88 mL/min — ABNORMAL LOW (ref >60.00)
Glucose, Bld: 127 mg/dL — ABNORMAL HIGH (ref 70–99)
Potassium: 4.2 mEq/L (ref 3.5–5.1)
Sodium: 137 mEq/L (ref 135–145)

## 2022-06-13 MED ORDER — AMOXICILLIN 500 MG PO CAPS: 500.0000 mg | ORAL_CAPSULE | Freq: Three times a day (TID) | ORAL | 0 refills | Status: AC

## 2022-06-13 MED ORDER — HYDROCODONE-ACETAMINOPHEN 5-325 MG PO TABS: 1.0000 | ORAL_TABLET | Freq: Three times a day (TID) | ORAL | 0 refills | Status: AC | PRN

## 2022-06-13 NOTE — Assessment & Plan Note (Signed)
Will elect to treat with amoxicillin 500 mg 3 times daily for 7 days.  Strict precautions reviewed in regards to urgent or emergent healthcare and following up in office.  Patient will work on trying to find a Education officer, community.

## 2022-06-13 NOTE — Progress Notes (Signed)
Acute Office Visit  Subjective:     Patient ID: John Blake, male    DOB: April 13, 1945, 77 y.o.   MRN: [id number]  Chief Complaint  Patient presents with   Jaw Pain    Left side X 3 days    HPI Patient is in today for jaw pain with a history of HTN, COPD, DM2, MGUS,  Symptoms started 3 days ago States that he has a abd tooth States no injury. Just woke up that way He has been using tylenol and topical numbing that has hlpeed but does not last long He currenlty does not have a dentist    Review of Systems  Constitutional:  Positive for malaise/fatigue. Negative for chills and fever.  HENT:  Negative for ear pain.        Denies difficulty swallowing or talking   Respiratory:  Negative for shortness of breath.   Cardiovascular:  Negative for chest pain.        Objective:    BP (!) 142/76   Pulse 88   Temp 98.1 F (36.7 C)   Resp 16   Ht 6\' 1"  (1.854 m)   Wt 232 lb 4 oz (105.3 kg)   SpO2 98%   BMI 30.64 kg/m    Physical Exam Vitals and nursing note reviewed.  Constitutional:      Appearance: Normal appearance.  HENT:     Head:      Right Ear: Tympanic membrane, ear canal and external ear normal.     Left Ear: Tympanic membrane, ear canal and external ear normal.     Mouth/Throat:     Mouth: Mucous membranes are moist.     Dentition: Abnormal dentition. Dental caries and dental abscesses present.     Pharynx: Oropharynx is clear.  Cardiovascular:     Rate and Rhythm: Normal rate and regular rhythm.     Heart sounds: Normal heart sounds.  Pulmonary:     Effort: Pulmonary effort is normal.     Breath sounds: Normal breath sounds.  Lymphadenopathy:     Cervical: No cervical adenopathy.  Neurological:     Mental Status: He is alert.     No results found for any visits on 06/13/22.      Assessment & Plan:   Problem List Items Addressed This Visit       Digestive   Dental abscess    Will elect to treat with amoxicillin 500 mg 3 times daily  for 7 days.  Strict precautions reviewed in regards to urgent or emergent healthcare and following up in office.  Patient will work on trying to find a Education officer, community.      Relevant Medications   amoxicillin (AMOXIL) 500 MG capsule   Other Relevant Orders   CBC   Basic metabolic panel     Other   Pain, dental - Primary    Will write short course of hydrocodone.  Sedation precautions reviewed.  Tylenol precautions reviewed with over-the-counter Tylenol and Tylenol and hydrocodone medication.      Relevant Medications   HYDROcodone-acetaminophen (NORCO/VICODIN) 5-325 MG tablet    Meds ordered this encounter  Medications   amoxicillin (AMOXIL) 500 MG capsule    Sig: Take 1 capsule (500 mg total) by mouth 3 (three) times daily for 7 days.    Dispense:  21 capsule    Refill:  0    Order Specific Question:   Supervising Provider    Answer:   Roxy Manns A [[id number]]  HYDROcodone-acetaminophen (NORCO/VICODIN) 5-325 MG tablet    Sig: Take 1 tablet by mouth 3 (three) times daily as needed for moderate pain.    Dispense:  9 tablet    Refill:  0    Order Specific Question:   Supervising Provider    Answer:   TOWER, MARNE A [[id number]]    Return if symptoms worsen or fail to improve.  Audria Nine, NP

## 2022-06-13 NOTE — Patient Instructions (Signed)
Nice to see you today I have written an antibiotic and pain medication You can tak tylenol 500mg  when you take the pain pill for extra relief.  Try and find you a dentist If you do not start improving over the next 2-3 days I need to see you again

## 2022-06-13 NOTE — Assessment & Plan Note (Signed)
Will write short course of hydrocodone.  Sedation precautions reviewed.  Tylenol precautions reviewed with over-the-counter Tylenol and Tylenol and hydrocodone medication.

## 2022-06-19 ENCOUNTER — Other Ambulatory Visit: Payer: Self-pay | Admitting: Nurse Practitioner

## 2022-06-19 DIAGNOSIS — J410 Simple chronic bronchitis: Secondary | ICD-10-CM

## 2022-08-23 ENCOUNTER — Ambulatory Visit (INDEPENDENT_AMBULATORY_CARE_PROVIDER_SITE_OTHER): Payer: Medicare HMO

## 2022-08-23 VITALS — BP 148/78 | HR 69 | Ht 73.0 in | Wt 234.8 lb

## 2022-08-23 DIAGNOSIS — Z0000 Encounter for general adult medical examination without abnormal findings: Secondary | ICD-10-CM | POA: Diagnosis not present
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
## 2022-08-23 NOTE — Patient Instructions (Signed)
John Blake , Thank you for taking time to come for your Medicare Wellness Visit. I appreciate your ongoing commitment to your health goals. Please review the following plan we discussed and let me know if I can assist you in the future.   Referrals/Orders/Follow-Ups/Clinician Recommendations: Aim for 30 minutes of exercise or brisk walking, 6-8 glasses of water, and 5 servings of fruits and vegetables each day.   If you wish to quit smoking, help is available. For free tobacco cessation program offerings call the [facility] at 254-100-9962 or Live Well Line at 269-465-4533. You may also visit www.[place].com or email livelifewell@[employer] .com for more information on other programs.   You may also call 1-800-QUIT-NOW (470-210-9739) or visit www.[place].ch or www.[employer]ome[employer]Ex.org for additional resources on smoking cessation.    This is a list of the screening recommended for you and due dates:  Health Maintenance  Topic Date Due   Screening for Lung Cancer  Never done   Eye exam for diabetics  07/21/2017   Complete foot exam   01/03/2020   DTaP/Tdap/Td vaccine (2 - Td or Tdap) 02/15/2021   Medicare Annual Wellness Visit  06/21/2021   COVID-19 Vaccine (6 - 2023-24 season) 09/16/2021   Flu Shot  08/17/2022   Zoster (Shingles) Vaccine (1 of 2) 11/05/2024*   Yearly kidney health urinalysis for diabetes  11/16/2022   Hemoglobin A1C  11/22/2022   Yearly kidney function blood test for diabetes  06/13/2023   Colon Cancer Screening  10/16/2024   Pneumonia Vaccine  Completed   Hepatitis C Screening  Completed   HPV Vaccine  Aged Out  *Topic was postponed. The date shown is not the original due date.    Advanced directives: (Declined) Advance directive discussed with you today. Even though you declined this today, please call our office should you change your mind, and we can give you the proper paperwork for you to fill out.  Next Medicare Annual Wellness Visit scheduled  for next year: Yes  Preventive Care 43 Years and Older, Male  Preventive care refers to lifestyle choices and visits with your health care provider that can promote health and wellness. What does preventive care include? A yearly physical exam. This is also called an annual well check. Dental exams once or twice a year. Routine eye exams. Ask your health care provider how often you should have your eyes checked. Personal lifestyle choices, including: Daily care of your teeth and gums. Regular physical activity. Eating a healthy diet. Avoiding tobacco and drug use. Limiting alcohol use. Practicing safe sex. Taking low doses of aspirin every day. Taking vitamin and mineral supplements as recommended by your health care provider. What happens during an annual well check? The services and screenings done by your health care provider during your annual well check will depend on your age, overall health, lifestyle risk factors, and family history of disease. Counseling  Your health care provider may ask you questions about your: Alcohol use. Tobacco use. Drug use. Emotional well-being. Home and relationship well-being. Sexual activity. Eating habits. History of falls. Memory and ability to understand (cognition). Work and work Astronomer. Screening  You may have the following tests or measurements: Height, weight, and BMI. Blood pressure. Lipid and cholesterol levels. These may be checked every 5 years, or more frequently if you are over 54 years old. Skin check. Lung cancer screening. You may have this screening every year starting at age 4 if you have a 30-pack-year history of smoking and  currently smoke or have quit within the past 15 years. Fecal occult blood test (FOBT) of the stool. You may have this test every year starting at age 80. Flexible sigmoidoscopy or colonoscopy. You may have a sigmoidoscopy every 5 years or a colonoscopy every 10 years starting at age  22. Prostate cancer screening. Recommendations will vary depending on your family history and other risks. Hepatitis C blood test. Hepatitis B blood test. Sexually transmitted disease (STD) testing. Diabetes screening. This is done by checking your blood sugar (glucose) after you have not eaten for a while (fasting). You may have this done every 1-3 years. Abdominal aortic aneurysm (AAA) screening. You may need this if you are a current or former smoker. Osteoporosis. You may be screened starting at age 54 if you are at high risk. Talk with your health care provider about your test results, treatment options, and if necessary, the need for more tests. Vaccines  Your health care provider may recommend certain vaccines, such as: Influenza vaccine. This is recommended every year. Tetanus, diphtheria, and acellular pertussis (Tdap, Td) vaccine. You may need a Td booster every 10 years. Zoster vaccine. You may need this after age 30. Pneumococcal 13-valent conjugate (PCV13) vaccine. One dose is recommended after age 57. Pneumococcal polysaccharide (PPSV23) vaccine. One dose is recommended after age 74. Talk to your health care provider about which screenings and vaccines you need and how often you need them. This information is not intended to replace advice given to you by your health care provider. Make sure you discuss any questions you have with your health care provider. Document Released: 01/29/2015 Document Revised: 09/22/2015 Document Reviewed: 11/03/2014 Elsevier Interactive Patient Education  2017 [employer].  Fall Prevention in the Home Falls can cause injuries. They can happen to people of all ages. There are many things you can do to make your home safe and to help prevent falls. What can I do on the outside of my home? Regularly fix the edges of walkways and driveways and fix any cracks. Remove anything that might make you trip as you walk through a door, such as a raised step or  threshold. Trim any bushes or trees on the path to your home. Use bright outdoor lighting. Clear any walking paths of anything that might make someone trip, such as rocks or tools. Regularly check to see if handrails are loose or broken. Make sure that both sides of any steps have handrails. Any raised decks and porches should have guardrails on the edges. Have any leaves, snow, or ice cleared regularly. Use sand or salt on walking paths during winter. Clean up any spills in your garage right away. This includes oil or grease spills. What can I do in the bathroom? Use night lights. Install grab bars by the toilet and in the tub and shower. Do not use towel bars as grab bars. Use non-skid mats or decals in the tub or shower. If you need to sit down in the shower, use a plastic, non-slip stool. Keep the floor dry. Clean up any water that spills on the floor as soon as it happens. Remove soap buildup in the tub or shower regularly. Attach bath mats securely with double-sided non-slip rug tape. Do not have throw rugs and other things on the floor that can make you trip. What can I do in the bedroom? Use night lights. Make sure that you have a light by your bed that is easy to reach. Do not use any sheets  or blankets that are too big for your bed. They should not hang down onto the floor. Have a firm chair that has side arms. You can use this for support while you get dressed. Do not have throw rugs and other things on the floor that can make you trip. What can I do in the kitchen? Clean up any spills right away. Avoid walking on wet floors. Keep items that you use a lot in easy-to-reach places. If you need to reach something above you, use a strong step stool that has a grab bar. Keep electrical cords out of the way. Do not use floor polish or wax that makes floors slippery. If you must use wax, use non-skid floor wax. Do not have throw rugs and other things on the floor that can make you  trip. What can I do with my stairs? Do not leave any items on the stairs. Make sure that there are handrails on both sides of the stairs and use them. Fix handrails that are broken or loose. Make sure that handrails are as long as the stairways. Check any carpeting to make sure that it is firmly attached to the stairs. Fix any carpet that is loose or worn. Avoid having throw rugs at the top or bottom of the stairs. If you do have throw rugs, attach them to the floor with carpet tape. Make sure that you have a light switch at the top of the stairs and the bottom of the stairs. If you do not have them, ask someone to add them for you. What else can I do to help prevent falls? Wear shoes that: Do not have high heels. Have rubber bottoms. Are comfortable and fit you well. Are closed at the toe. Do not wear sandals. If you use a stepladder: Make sure that it is fully opened. Do not climb a closed stepladder. Make sure that both sides of the stepladder are locked into place. Ask someone to hold it for you, if possible. Clearly mark and make sure that you can see: Any grab bars or handrails. First and last steps. Where the edge of each step is. Use tools that help you move around (mobility aids) if they are needed. These include: Canes. Walkers. Scooters. Crutches. Turn on the lights when you go into a dark area. Replace any light bulbs as soon as they burn out. Set up your furniture so you have a clear path. Avoid moving your furniture around. If any of your floors are uneven, fix them. If there are any pets around you, be aware of where they are. Review your medicines with your doctor. Some medicines can make you feel dizzy. This can increase your chance of falling. Ask your doctor what other things that you can do to help prevent falls. This information is not intended to replace advice given to you by your health care provider. Make sure you discuss any questions you have with your  health care provider. Document Released: 10/29/2008 Document Revised: 06/10/2015 Document Reviewed: 02/06/2014 Elsevier Interactive Patient Education  2017 [employer].

## 2022-11-08 ENCOUNTER — Ambulatory Visit: Payer: Medicare HMO | Admitting: Nurse Practitioner

## 2022-11-09 ENCOUNTER — Ambulatory Visit (INDEPENDENT_AMBULATORY_CARE_PROVIDER_SITE_OTHER): Payer: Medicare HMO | Admitting: Nurse Practitioner

## 2022-11-09 ENCOUNTER — Encounter: Payer: Self-pay | Admitting: Nurse Practitioner

## 2022-11-09 VITALS — BP 142/78 | HR 76 | Temp 98.2°F | Ht 73.0 in | Wt 233.8 lb

## 2022-11-09 DIAGNOSIS — R1013 Epigastric pain: Secondary | ICD-10-CM

## 2022-11-09 LAB — LIPASE: Lipase: 96 U/L — ABNORMAL HIGH (ref 11.0–59.0)

## 2022-11-09 LAB — COMPREHENSIVE METABOLIC PANEL
ALT: 16 U/L (ref 0–53)
AST: 18 U/L (ref 0–37)
Albumin: 4.5 g/dL (ref 3.5–5.2)
Alkaline Phosphatase: 43 U/L (ref 39–117)
BUN: 29 mg/dL — ABNORMAL HIGH (ref 6–23)
CO2: 26 meq/L (ref 19–32)
Calcium: 9.6 mg/dL (ref 8.4–10.5)
Chloride: 107 meq/L (ref 96–112)
Creatinine, Ser: 1.41 mg/dL (ref 0.40–1.50)
GFR: 48.87 mL/min — ABNORMAL LOW (ref >60.00)
Glucose, Bld: 117 mg/dL — ABNORMAL HIGH (ref 70–99)
Potassium: 3.9 meq/L (ref 3.5–5.1)
Sodium: 140 meq/L (ref 135–145)
Total Bilirubin: 0.6 mg/dL (ref 0.2–1.2)
Total Protein: 7.5 g/dL (ref 6.0–8.3)

## 2022-11-09 LAB — CBC
HCT: 46.3 % (ref 39.0–52.0)
Hemoglobin: 15.2 g/dL (ref 13.0–17.0)
MCHC: 32.7 g/dL (ref 30.0–36.0)
MCV: 98.7 fL (ref 78.0–100.0)
Platelets: 198 10*3/uL (ref 150.0–400.0)
RBC: 4.7 Mil/uL (ref 4.22–5.81)
RDW: 13.9 % (ref 11.5–15.5)
WBC: 8 10*3/uL (ref 4.0–10.5)

## 2022-11-09 MED ORDER — OMEPRAZOLE 20 MG PO CPDR: 20.0000 mg | DELAYED_RELEASE_CAPSULE | Freq: Every day | ORAL | 0 refills | Status: DC

## 2022-11-09 NOTE — Patient Instructions (Signed)
Nice to see you today I will be in touch with the labs once I have reviewed them Avoid NSAIDs Follow up with me as scheduled

## 2022-11-09 NOTE — Progress Notes (Signed)
Acute Office Visit  Subjective:     Patient ID: John Blake, male    DOB: 21-May-1945, 77 y.o.   MRN: [id number]  Chief Complaint  Patient presents with   stomach spasms    Pt complains spasms started 5 days ago. No nausea/vomiting. States he still has appetite. Pt complains of the cramps lasting a few seconds. Says it could take hours for spasms to come back. Pt states that the pain goes away when laying down at night.     HPI Patient is in today for stomach pains with a history of CKD, COPD, HLD, DM2, Perianal fistula   Symptoms started 5 days ago. States that he has stomach problems in the past like gas or diarrhea. States that this is a grabbing spasm that is intermittent that is less than a minute. States that  he has no trouble eating. States that eating doesn't make it hurt but can make it feel better. States that it does nt bother him the first part of the day or at night. It does not wake him up.  State that he has a BM everyday. Last Bm today that has not changed States increased gas Last colonscopy 2020  Review of Systems  Constitutional:  Negative for chills and fever.  Respiratory:  Negative for shortness of breath.   Cardiovascular:  Negative for chest pain.  Gastrointestinal:  Positive for abdominal pain. Negative for constipation, diarrhea, nausea and vomiting.  Genitourinary:  Negative for dysuria, frequency and hematuria.        Objective:    BP (!) 142/78   Pulse 76   Temp 98.2 F (36.8 C) (Oral)   Ht 6\' 1"  (1.854 m)   Wt 233 lb 12.8 oz (106.1 kg)   SpO2 97%   BMI 30.85 kg/m    Physical Exam Vitals and nursing note reviewed.  Constitutional:      Appearance: Normal appearance.  Cardiovascular:     Rate and Rhythm: Normal rate and regular rhythm.     Heart sounds: Normal heart sounds.  Pulmonary:     Effort: Pulmonary effort is normal.     Breath sounds: Normal breath sounds.  Abdominal:     General: Bowel sounds are normal. There is  distension.     Palpations: There is no mass.     Tenderness: There is abdominal tenderness. There is no right CVA tenderness or left CVA tenderness.     Hernia: No hernia is present.    Neurological:     Mental Status: He is alert.     No results found for any visits on 11/09/22.      Assessment & Plan:   Problem List Items Addressed This Visit       Other   Epigastric pain - Primary    Concern for ulcer.  Will treat with omeprazole 20 mg daily for 30 days at least.  Check basic labs inclusive of CBC, CMP, lipase.  Patient to avoid NSAIDs.  Patient to avoid alcohol for at least a week.      Relevant Medications   omeprazole (PRILOSEC) 20 MG capsule   Other Relevant Orders   CBC   Comprehensive metabolic panel   Lipase    Meds ordered this encounter  Medications   omeprazole (PRILOSEC) 20 MG capsule    Sig: Take 1 capsule (20 mg total) by mouth daily.    Dispense:  30 capsule    Refill:  0    Order Specific Question:  Supervising Provider    Answer:   Roxy Manns A [[id number]]    Return if symptoms worsen or fail to improve, for As scheduled .  Audria Nine, NP

## 2022-11-09 NOTE — Assessment & Plan Note (Signed)
Concern for ulcer.  Will treat with omeprazole 20 mg daily for 30 days at least.  Check basic labs inclusive of CBC, CMP, lipase.  Patient to avoid NSAIDs.  Patient to avoid alcohol for at least a week.

## 2022-11-16 ENCOUNTER — Other Ambulatory Visit: Payer: Self-pay | Admitting: Nurse Practitioner

## 2022-11-16 DIAGNOSIS — M109 Gout, unspecified: Secondary | ICD-10-CM

## 2022-11-16 DIAGNOSIS — I10 Essential (primary) hypertension: Secondary | ICD-10-CM

## 2022-11-22 ENCOUNTER — Encounter: Payer: Self-pay | Admitting: Nurse Practitioner

## 2022-11-22 ENCOUNTER — Other Ambulatory Visit: Payer: Self-pay | Admitting: Nurse Practitioner

## 2022-11-22 ENCOUNTER — Ambulatory Visit (INDEPENDENT_AMBULATORY_CARE_PROVIDER_SITE_OTHER): Payer: Medicare HMO | Admitting: Nurse Practitioner

## 2022-11-22 VITALS — BP 138/80 | HR 72 | Temp 97.6°F | Ht 73.5 in | Wt 248.8 lb

## 2022-11-22 DIAGNOSIS — Z0000 Encounter for general adult medical examination without abnormal findings: Secondary | ICD-10-CM | POA: Diagnosis not present

## 2022-11-22 DIAGNOSIS — R748 Abnormal levels of other serum enzymes: Secondary | ICD-10-CM

## 2022-11-22 DIAGNOSIS — E1165 Type 2 diabetes mellitus with hyperglycemia: Secondary | ICD-10-CM | POA: Diagnosis not present

## 2022-11-22 DIAGNOSIS — Z23 Encounter for immunization: Secondary | ICD-10-CM | POA: Diagnosis not present

## 2022-11-22 DIAGNOSIS — E785 Hyperlipidemia, unspecified: Secondary | ICD-10-CM | POA: Diagnosis not present

## 2022-11-22 DIAGNOSIS — J449 Chronic obstructive pulmonary disease, unspecified: Secondary | ICD-10-CM | POA: Diagnosis not present

## 2022-11-22 DIAGNOSIS — Z8739 Personal history of other diseases of the musculoskeletal system and connective tissue: Secondary | ICD-10-CM | POA: Diagnosis not present

## 2022-11-22 DIAGNOSIS — M109 Gout, unspecified: Secondary | ICD-10-CM

## 2022-11-22 DIAGNOSIS — F17200 Nicotine dependence, unspecified, uncomplicated: Secondary | ICD-10-CM

## 2022-11-22 DIAGNOSIS — I10 Essential (primary) hypertension: Secondary | ICD-10-CM | POA: Diagnosis not present

## 2022-11-22 DIAGNOSIS — Z125 Encounter for screening for malignant neoplasm of prostate: Secondary | ICD-10-CM | POA: Diagnosis not present

## 2022-11-22 DIAGNOSIS — Z122 Encounter for screening for malignant neoplasm of respiratory organs: Secondary | ICD-10-CM

## 2022-11-22 LAB — COMPREHENSIVE METABOLIC PANEL
ALT: 11 U/L (ref 0–53)
AST: 13 U/L (ref 0–37)
Albumin: 4 g/dL (ref 3.5–5.2)
Alkaline Phosphatase: 47 U/L (ref 39–117)
BUN: 26 mg/dL — ABNORMAL HIGH (ref 6–23)
CO2: 29 meq/L (ref 19–32)
Calcium: 9.1 mg/dL (ref 8.4–10.5)
Chloride: 104 meq/L (ref 96–112)
Creatinine, Ser: 1.32 mg/dL (ref 0.40–1.50)
GFR: 52.88 mL/min — ABNORMAL LOW (ref >60.00)
Glucose, Bld: 121 mg/dL — ABNORMAL HIGH (ref 70–99)
Potassium: 4.3 meq/L (ref 3.5–5.1)
Sodium: 141 meq/L (ref 135–145)
Total Bilirubin: 0.5 mg/dL (ref 0.2–1.2)
Total Protein: 6.6 g/dL (ref 6.0–8.3)

## 2022-11-22 LAB — PSA, MEDICARE: PSA: 0.72 ng/mL (ref 0.10–4.00)

## 2022-11-22 LAB — CBC
HCT: 42.9 % (ref 39.0–52.0)
Hemoglobin: 14 g/dL (ref 13.0–17.0)
MCHC: 32.6 g/dL (ref 30.0–36.0)
MCV: 98.6 fL (ref 78.0–100.0)
Platelets: 220 10*3/uL (ref 150.0–400.0)
RBC: 4.35 Mil/uL (ref 4.22–5.81)
RDW: 13.8 % (ref 11.5–15.5)
WBC: 6.2 10*3/uL (ref 4.0–10.5)

## 2022-11-22 LAB — LIPASE: Lipase: 76 U/L — ABNORMAL HIGH (ref 11.0–59.0)

## 2022-11-22 LAB — URINALYSIS, MICROSCOPIC ONLY
RBC / HPF: NONE SEEN (ref 0–?)
WBC, UA: NONE SEEN (ref 0–?)

## 2022-11-22 LAB — TSH: TSH: 3.83 u[IU]/mL (ref 0.35–5.50)

## 2022-11-22 LAB — LIPID PANEL
Cholesterol: 131 mg/dL (ref 0–200)
HDL: 60 mg/dL (ref >39.00)
LDL Cholesterol: 53 mg/dL (ref 0–99)
NonHDL: 70.87
Total CHOL/HDL Ratio: 2
Triglycerides: 89 mg/dL (ref 0.0–149.0)
VLDL: 17.8 mg/dL (ref 0.0–40.0)

## 2022-11-22 LAB — MICROALBUMIN / CREATININE URINE RATIO
Creatinine,U: 85.9 mg/dL
Microalb Creat Ratio: 10.1 mg/g (ref 0.0–30.0)
Microalb, Ur: 8.7 mg/dL — ABNORMAL HIGH (ref 0.0–1.9)

## 2022-11-22 LAB — POCT GLYCOSYLATED HEMOGLOBIN (HGB A1C): Hemoglobin A1C: 6.1 % — AB (ref 4.0–5.6)

## 2022-11-22 LAB — URIC ACID: Uric Acid, Serum: 6.4 mg/dL (ref 4.0–7.8)

## 2022-11-22 NOTE — Assessment & Plan Note (Signed)
Patient on allopurinol 100 mg to colchicine.  Will check uric acid today both medication were refilled on November 16, 2022

## 2022-11-22 NOTE — Assessment & Plan Note (Signed)
Recent history of same.  Patient does drink a couple alcoholic beverages a day.  Will recheck lipase today pending result

## 2022-11-22 NOTE — Progress Notes (Signed)
Established Patient Office Visit  Subjective   Patient ID: John Blake, male    DOB: 10/14/1945  Age: 77 y.o. MRN: [id number]  Chief Complaint  Patient presents with   Annual Exam   Medication Refill    Amlodipine, Colchicine, and Hydrocodone/Norco        HTN: Patient currently maintained on amlodipine 5 mg.  Tolerates medication well  COPD: Patient currently maintained on Wixela and albuterol inhaler as needed. States that he will use the albuterol approx once a week.   DM2: Patient currently maintained on diet and lifestyle modifications only.  Does not check sugars at home  Gout: Patient currently maintained allopurinol 100 mg and colchicine as needed.  Patient states he is currently in a gout flare  for complete physical and follow up of chronic conditions.  Immunizations: -Tetanus: Completed in 2013,  update at local pharmacy -Influenza: Update today -Shingles: get at local pharmacy  -Pneumonia: Completed, needs prevnar 20  Diet: Fair diet. 2-3 meals a day. States that he will snack in the middle of the day. Tea and coffee in the am.  Exercise: No regular exercise.  Eye exam: Needs updating   Dental exam: Needs updating   Colonoscopy: Completed in 10/17/2019, recall in 5 years. Due 2026 Lung Cancer Screening: Needs referral   PSA: Due  Sleep: goes to bed aroun 830 and will up get up at 530. He feels rested.     Review of Systems  Constitutional:  Negative for chills and fever.  Respiratory:  Negative for shortness of breath.   Cardiovascular:  Negative for chest pain and leg swelling.  Gastrointestinal:  Negative for abdominal pain, blood in stool, constipation, diarrhea, nausea and vomiting.       BM daily   Genitourinary:  Negative for dysuria and hematuria.       Nocturia x1   Neurological:  Negative for tingling and headaches.  Psychiatric/Behavioral:  Negative for hallucinations and suicidal ideas.       Objective:     BP 138/80   Pulse 72    Temp 97.6 F (36.4 C) (Oral)   Ht 6' 1.5" (1.867 m)   Wt 248 lb 12.8 oz (112.9 kg)   SpO2 93%   BMI 32.38 kg/m    Physical Exam Vitals and nursing note reviewed.  Constitutional:      Appearance: Normal appearance.  HENT:     Right Ear: Tympanic membrane, ear canal and external ear normal.     Left Ear: Tympanic membrane, ear canal and external ear normal.     Mouth/Throat:     Mouth: Mucous membranes are moist.     Pharynx: Oropharynx is clear.  Eyes:     Extraocular Movements: Extraocular movements intact.     Pupils: Pupils are equal, round, and reactive to light.  Cardiovascular:     Rate and Rhythm: Normal rate and regular rhythm.     Pulses: Normal pulses.     Heart sounds: Normal heart sounds.  Pulmonary:     Effort: Pulmonary effort is normal.     Breath sounds: Normal breath sounds.  Abdominal:     General: Bowel sounds are normal. There is no distension.     Palpations: There is no mass.     Tenderness: There is no abdominal tenderness.     Hernia: No hernia is present.  Musculoskeletal:     Right lower leg: No edema.     Left lower leg: No edema.  Lymphadenopathy:  Cervical: No cervical adenopathy.  Skin:    General: Skin is warm.  Neurological:     General: No focal deficit present.     Mental Status: He is alert.     Deep Tendon Reflexes:     Reflex Scores:      Bicep reflexes are 2+ on the right side and 2+ on the left side.      Patellar reflexes are 2+ on the right side and 2+ on the left side.    Comments: Bilateral upper and lower extremity strength 5/5  Psychiatric:        Mood and Affect: Mood normal.        Behavior: Behavior normal.        Thought Content: Thought content normal.        Judgment: Judgment normal.    Title   Diabetic Foot Exam - detailed Is there a history of foot ulcer?: No Is there a foot ulcer now?: No Is there swelling?: Yes Is there elevated skin temperature?: No Is there abnormal foot shape?: No Is there  a claw toe deformity?: No Are the toenails long?: No Are the toenails thick?: No Are the toenails ingrown?: No Is the skin thin, fragile, shiny and hairless?": No Right Posterior Tibialis: Diminished Left posterior Tibialis: Diminished   Right Dorsalis Pedis: Present Left Dorsalis Pedis: Present     Semmes-Weinstein Monofilament Test "+" means "has sensation" and "-" means "no sensation"      Image components are not supported.   Image components are not supported. Image components are not supported.  [place] Comments Site 10 on bilateral feet absent. All other intact       Results for orders placed or performed in visit on 11/22/22  POCT glycosylated hemoglobin (Hb A1C)  Result Value Ref Range   Hemoglobin A1C 6.1 (A) 4.0 - 5.6 %   HbA1c POC (<> result, manual entry)     HbA1c, POC (prediabetic range)     HbA1c, POC (controlled diabetic range)        The ASCVD Risk score (Arnett DK, et al., 2019) failed to calculate for the following reasons:   Cannot find a previous HDL lab   Cannot find a previous total cholesterol lab    Assessment & Plan:   Problem List Items Addressed This Visit       Cardiovascular and Mediastinum   Essential hypertension    Patient currently maintained on amlodipine 5 mg daily.  Tolerates medication well.  Blood pressure controlled continue        Respiratory   COPD (chronic obstructive pulmonary disease) (HCC)    Currently maintained on Wixela and albuterol inhaler as needed.  Stable continue medication as prescribed        Endocrine   Type 2 diabetes mellitus with hyperglycemia, without long-term current use of insulin (HCC)    Patient currently maintained on diet alone.  Patient's A1c well-controlled at 6.1%.  Continue working lifestyle modification      Relevant Orders   POCT glycosylated hemoglobin (Hb A1C) (Completed)   Microalbumin / creatinine urine ratio   Lipid panel     Other   Hyperlipidemia    Patient  currently maintained on no antilipid medicine currently.  Pending lipid panel      Relevant Orders   Lipid panel   Smoker   Relevant Orders   Urine Microscopic   History of gout    Patient on allopurinol 100 mg to colchicine.  Will check  uric acid today both medication were refilled on November 16, 2022      Relevant Orders   Uric acid   Preventative health care - Primary    Discussed age-appropriate immunizations and screening exams.  Did review patient's personal, surgical, social, family histories.  Patient up-to-date on all age-appropriate vaccinations he would like.  Update flu and pneumonia vaccine today.  Patient is up-to-date on CRC screening will do PSA for prostate cancer screening today.  Patient was given information at discharge about preventative healthcare maintenance with anticipatory guidance.      Relevant Orders   CBC   Comprehensive metabolic panel   TSH   Elevated lipase    Recent history of same.  Patient does drink a couple alcoholic beverages a day.  Will recheck lipase today pending result      Relevant Orders   Lipase   Other Visit Diagnoses     Screening for lung cancer       Relevant Orders   Ambulatory Referral Lung Cancer Screening [facility]   Screening for prostate cancer       Relevant Orders   PSA, Medicare   Need for pneumococcal 20-valent conjugate vaccination       Relevant Orders   Pneumococcal conjugate vaccine 20-valent (Prevnar 20) (Completed)   Need for influenza vaccination       Relevant Orders   Flu Vaccine Trivalent High Dose (Fluad) (Completed)       Return in about 6 months (around 05/22/2023) for DM recheck.    Audria Nine, NP

## 2022-11-22 NOTE — Patient Instructions (Signed)
Nice to see you today I will be in touch with the labs once I have them  Get your tetanus vaccine and the shingles vaccine at your local pharmacy  Follow up with me in 6 months, sooner if you need me

## 2022-11-22 NOTE — Assessment & Plan Note (Signed)
Patient currently maintained on no antilipid medicine currently.  Pending lipid panel

## 2022-11-22 NOTE — Assessment & Plan Note (Signed)
Patient currently maintained on amlodipine 5 mg daily.  Tolerates medication well.  Blood pressure controlled continue

## 2022-11-22 NOTE — Assessment & Plan Note (Signed)
Patient currently maintained on diet alone.  Patient's A1c well-controlled at 6.1%.  Continue working lifestyle modification

## 2022-11-22 NOTE — Assessment & Plan Note (Signed)
Currently maintained on Wixela and albuterol inhaler as needed.  Stable continue medication as prescribed

## 2022-11-22 NOTE — Assessment & Plan Note (Signed)
Discussed age-appropriate immunizations and screening exams.  Did review patient's personal, surgical, social, family histories.  Patient up-to-date on all age-appropriate vaccinations he would like.  Update flu and pneumonia vaccine today.  Patient is up-to-date on CRC screening will do PSA for prostate cancer screening today.  Patient was given information at discharge about preventative healthcare maintenance with anticipatory guidance.

## 2022-11-25 ENCOUNTER — Other Ambulatory Visit: Payer: Self-pay | Admitting: Nurse Practitioner

## 2022-11-25 DIAGNOSIS — R1013 Epigastric pain: Secondary | ICD-10-CM

## 2022-11-25 DIAGNOSIS — J410 Simple chronic bronchitis: Secondary | ICD-10-CM

## 2022-12-05 ENCOUNTER — Encounter: Payer: Self-pay | Admitting: Nurse Practitioner

## 2022-12-07 ENCOUNTER — Telehealth: Payer: Self-pay

## 2022-12-07 NOTE — Patient Outreach (Signed)
Attempted to contact patient regarding care gaps. Left voicemail for patient to return my call at [phone].  Nicholes Rough, CMA Care Guide [employer]

## 2023-01-14 ENCOUNTER — Other Ambulatory Visit: Payer: Self-pay | Admitting: Nurse Practitioner

## 2023-01-14 DIAGNOSIS — R1013 Epigastric pain: Secondary | ICD-10-CM

## 2023-03-08 ENCOUNTER — Other Ambulatory Visit: Payer: Self-pay | Admitting: Nurse Practitioner

## 2023-03-08 DIAGNOSIS — R1013 Epigastric pain: Secondary | ICD-10-CM

## 2023-03-09 ENCOUNTER — Other Ambulatory Visit: Payer: Self-pay | Admitting: Nurse Practitioner

## 2023-03-09 DIAGNOSIS — R1013 Epigastric pain: Secondary | ICD-10-CM

## 2023-03-09 MED ORDER — OMEPRAZOLE 20 MG PO CPDR: 20.0000 mg | DELAYED_RELEASE_CAPSULE | Freq: Every day | ORAL | 0 refills | Status: DC

## 2023-03-09 NOTE — Telephone Encounter (Signed)
Copied from CRM [id number]. Topic: Clinical - Medication Refill >> Mar 09, 2023 11:12 AM Pascal Lux wrote: Most Recent Primary Care Visit:  Provider: Eden Emms  Department: [facility]  Visit Type: PHYSICAL  Date: 11/22/2022  Medication: omeprazole (PRILOSEC) 20 MG capsule [[id number]]  Has the patient contacted their pharmacy? Yes (Agent: If no, request that the patient contact the pharmacy for the refill. If patient does not wish to contact the pharmacy document the reason why and proceed with request.) (Agent: If yes, when and what did the pharmacy advise?) they said they would reach out to [place].  Is this the correct pharmacy for this prescription? Yes If no, delete pharmacy and type the correct one.  This is the patient's preferred pharmacy:  [facility] [phone] [place], [place] - [address] [place] [address] Phone: [phone] Fax: [phone]    Has the prescription been filled recently? No  Is the patient out of the medication? Yes  Has the patient been seen for an appointment in the last year OR does the patient have an upcoming appointment? Yes  Can we respond through MyChart? No  Agent: Please be advised that Rx refills may take up to 3 business days. We ask that you follow-up with your pharmacy.

## 2023-04-01 ENCOUNTER — Other Ambulatory Visit: Payer: Self-pay | Admitting: Nurse Practitioner

## 2023-04-01 DIAGNOSIS — R1013 Epigastric pain: Secondary | ICD-10-CM

## 2023-05-17 ENCOUNTER — Other Ambulatory Visit: Payer: Self-pay | Admitting: Nurse Practitioner

## 2023-05-17 DIAGNOSIS — M109 Gout, unspecified: Secondary | ICD-10-CM

## 2023-05-17 NOTE — Telephone Encounter (Signed)
 Patient needs a DM2 follow up scheduled within the next 30 days please

## 2023-05-17 NOTE — Telephone Encounter (Signed)
 Unable to leave vm, no mychart to send message will follow up

## 2023-05-18 ENCOUNTER — Encounter: Payer: Self-pay | Admitting: Nurse Practitioner

## 2023-05-18 NOTE — Telephone Encounter (Signed)
 Sent letter

## 2023-05-21 ENCOUNTER — Other Ambulatory Visit: Payer: Self-pay | Admitting: Nurse Practitioner

## 2023-05-21 DIAGNOSIS — J449 Chronic obstructive pulmonary disease, unspecified: Secondary | ICD-10-CM

## 2023-05-21 NOTE — Telephone Encounter (Signed)
 Patient is due for an office visit for DM2 follow up. Please schedule

## 2023-05-21 NOTE — Telephone Encounter (Signed)
 Contacted pt, was unable to leave vm

## 2023-05-22 NOTE — Telephone Encounter (Signed)
 Unable to leave vm.

## 2023-05-23 NOTE — Telephone Encounter (Signed)
 Unable to leave vm.

## 2023-06-27 ENCOUNTER — Ambulatory Visit: Admitting: Nurse Practitioner

## 2023-06-27 VITALS — BP 138/86 | HR 80 | Temp 97.8°F | Ht 73.5 in | Wt 251.0 lb

## 2023-06-27 DIAGNOSIS — J410 Simple chronic bronchitis: Secondary | ICD-10-CM | POA: Diagnosis not present

## 2023-06-27 DIAGNOSIS — E1165 Type 2 diabetes mellitus with hyperglycemia: Secondary | ICD-10-CM | POA: Diagnosis not present

## 2023-06-27 DIAGNOSIS — F322 Major depressive disorder, single episode, severe without psychotic features: Secondary | ICD-10-CM

## 2023-06-27 DIAGNOSIS — I10 Essential (primary) hypertension: Secondary | ICD-10-CM

## 2023-06-27 LAB — POCT GLYCOSYLATED HEMOGLOBIN (HGB A1C): Hemoglobin A1C: 5.8 % — AB (ref 4.0–5.6)

## 2023-06-27 MED ORDER — SERTRALINE HCL 50 MG PO TABS: ORAL_TABLET | ORAL | 0 refills | Status: DC

## 2023-06-27 MED ORDER — ALBUTEROL SULFATE HFA 108 (90 BASE) MCG/ACT IN AERS: 1.0000 | INHALATION_SPRAY | Freq: Four times a day (QID) | RESPIRATORY_TRACT | 1 refills | Status: DC | PRN

## 2023-06-27 NOTE — Assessment & Plan Note (Signed)
 Patient states since his wife died he feels like he has been depressed.  No HI/SI/AVH.  Will start patient on sertraline 25 mg daily for 2 weeks then increase to 50 mg daily thereafter.  Patient to follow-up in 6 weeks with me sooner if needed.  Query if this is part of his dizziness/lightheadedness.

## 2023-06-27 NOTE — Progress Notes (Signed)
 Established Patient Office Visit  Subjective   Patient ID: John Blake, male    DOB: [birth year]  Age: 78 y.o. MRN: [id number]  Chief Complaint  Patient presents with   Follow-up    Pt complains of not feeling great recently. States of dizziness.     HPI  DM2: patient is currenlty on lifestyle modiciations only. States that he does eat 2 times a day. Sometimes a meal and a snack.  He does drink a lot of sweet tea. He uses little sugar or splenda. He will have ginger ale  YQM:VHQIONGEX maintained on amlodiipine 5mg  daily. He does not check his blood pressure at home. Does not have a cuff   Dizziness: he has been feeling like he has not had any energy. States that he is sleeping too much during the day and sleeps poor during the night. [place] a [place] to do and will nap He feels unsteady on his legs. More of a light headedness. States that when he has been up for a while he has that feeling. He is unsure if its the coffee. He does not have any chest pain.  States that he is short of breath but nothing more than normal     Review of Systems  Constitutional:  Negative for chills and fever.  Respiratory:  Positive for shortness of breath (Baseline).   Cardiovascular:  Negative for chest pain.  Neurological:  Positive for dizziness (light headedness). Negative for headaches.  Psychiatric/Behavioral:  Negative for hallucinations and suicidal ideas.       Objective:      BP 138/86   Pulse 80   Temp 97.8 F (36.6 C) (Oral)   Ht 6' 1.5 (1.867 m)   Wt 251 lb (113.9 kg)   SpO2 97%   BMI 32.67 kg/m  BP Readings from Last 3 Encounters:  06/27/23 138/86  11/22/22 138/80  11/09/22 (!) 142/78   Wt Readings from Last 3 Encounters:  06/27/23 251 lb (113.9 kg)  11/22/22 248 lb 12.8 oz (112.9 kg)  11/09/22 233 lb 12.8 oz (106.1 kg)   SpO2 Readings from Last 3 Encounters:  06/27/23 97%  11/22/22 93%  11/09/22 97%       Physical Exam Vitals and nursing note reviewed.  Constitutional:      Appearance: Normal appearance.  HENT:     Right Ear: Tympanic membrane, ear canal and external ear normal.     Left Ear: Tympanic membrane, ear canal and external ear normal.  Neck:     Vascular: No carotid bruit.  Cardiovascular:     Rate and Rhythm: Normal rate and regular rhythm.     Pulses: Normal pulses.     Heart sounds: Normal heart sounds.  Pulmonary:     Effort: Pulmonary effort is normal.     Breath sounds: Normal breath sounds.  Neurological:     Mental Status: He is alert.     Comments: Bilateral upper and longer strength 5/5      Results for orders placed or performed in visit on 06/27/23  POCT glycosylated hemoglobin (Hb A1C)  Result Value Ref Range   Hemoglobin A1C 5.8 (A) 4.0 - 5.6 %   HbA1c POC (<> result, manual entry)     HbA1c, POC (prediabetic range)     HbA1c, POC (controlled diabetic range)        The 10-year ASCVD risk score (Arnett DK, et al., 2019) is: 47.8%  Assessment & Plan:   Problem List Items Addressed This Visit       Cardiovascular and Mediastinum   Essential hypertension   Patient currently maintained on amlodipine  5 mg daily.  Blood pressure high on initial check but within normal limits upon recheck.  Will continue amlodipine  5 mg daily      Relevant Orders   CBC   Basic metabolic panel with GFR   TSH     Respiratory   COPD (chronic obstructive pulmonary disease) (HCC)   Relevant Medications   albuterol  (VENTOLIN  HFA) 108 (90 Base) MCG/ACT inhaler     Endocrine   Type 2 diabetes mellitus with hyperglycemia, without long-term current use of insulin  (HCC) - Primary   History of the same.  Patient currently maintained on lifestyle modifications only.  Patient's A1c 5.8% today.  Continue working on healthy lifestyle modifications      Relevant Orders   POCT glycosylated hemoglobin (Hb A1C) (Completed)   Vitamin B12     Other   Current severe  episode of major depressive disorder without psychotic features without prior episode [facility])   Patient states since his wife died he feels like he has been depressed.  No HI/SI/AVH.  Will start patient on sertraline 25 mg daily for 2 weeks then increase to 50 mg daily thereafter.  Patient to follow-up in 6 weeks with me sooner if needed.  Query if this is part of his dizziness/lightheadedness.      Relevant Medications   sertraline (ZOLOFT) 50 MG tablet   Other Relevant Orders   Vitamin B12    Return in about 6 weeks (around 08/08/2023) for MDD/lightheadedness.    Margarie Shay, NP

## 2023-06-27 NOTE — Assessment & Plan Note (Addendum)
 History of the same.  Patient currently maintained on lifestyle modifications only.  Patient's A1c 5.8% today.  Continue working on healthy lifestyle modifications

## 2023-06-27 NOTE — Patient Instructions (Signed)
Nice to see you today I want to see you in 6 weeks, sooner if you need me 

## 2023-06-27 NOTE — Assessment & Plan Note (Signed)
 Patient currently maintained on amlodipine  5 mg daily.  Blood pressure high on initial check but within normal limits upon recheck.  Will continue amlodipine  5 mg daily

## 2023-06-28 LAB — BASIC METABOLIC PANEL WITH GFR
BUN: 20 mg/dL (ref 7–25)
CO2: 17 mmol/L — ABNORMAL LOW (ref 20–32)
Calcium: 10.1 mg/dL (ref 8.6–10.3)
Chloride: 104 mmol/L (ref 98–110)
Creat: 1.25 mg/dL (ref 0.70–1.28)
Glucose, Bld: 126 mg/dL — ABNORMAL HIGH (ref 65–99)
Potassium: 4.6 mmol/L (ref 3.5–5.3)
Sodium: 142 mmol/L (ref 135–146)
eGFR: 60 mL/min/{1.73_m2} (ref >=60)

## 2023-06-28 LAB — CBC
HCT: 45.2 % (ref 38.5–50.0)
Hemoglobin: 15.3 g/dL (ref 13.2–17.1)
MCH: 33.1 pg — ABNORMAL HIGH (ref 27.0–33.0)
MCHC: 33.8 g/dL (ref 32.0–36.0)
MCV: 97.8 fL (ref 80.0–100.0)
MPV: 10.5 fL (ref 7.5–12.5)
Platelets: 209 10*3/uL (ref 140–400)
RBC: 4.62 10*6/uL (ref 4.20–5.80)
RDW: 12.5 % (ref 11.0–15.0)
WBC: 7.5 10*3/uL (ref 3.8–10.8)

## 2023-06-28 LAB — VITAMIN B12: Vitamin B-12: 251 pg/mL (ref 200–1100)

## 2023-06-28 LAB — TSH: TSH: 3.51 m[IU]/L (ref 0.40–4.50)

## 2023-06-29 ENCOUNTER — Ambulatory Visit: Payer: Self-pay | Admitting: Nurse Practitioner

## 2023-06-29 NOTE — Telephone Encounter (Signed)
Sent letter to pt address on file.

## 2023-07-11 ENCOUNTER — Telehealth: Payer: Self-pay | Admitting: Nurse Practitioner

## 2023-07-11 NOTE — Telephone Encounter (Signed)
-----   Message from [facility] sent at 06/27/2023 10:56 AM EDT ----- Regarding: Mood Can we see how the patient is doing with the lightheadedness/balance/mood

## 2023-07-11 NOTE — Telephone Encounter (Signed)
 Can we see how the patient is doing with the lightheadedness/balance/mood

## 2023-07-11 NOTE — Telephone Encounter (Signed)
 Called pt. No answer and Unable to leave voicemail.

## 2023-07-12 NOTE — Telephone Encounter (Signed)
 Called pt. No answer and Unable to leave voicemail.

## 2023-07-19 ENCOUNTER — Other Ambulatory Visit: Payer: Self-pay | Admitting: Nurse Practitioner

## 2023-07-19 DIAGNOSIS — F322 Major depressive disorder, single episode, severe without psychotic features: Secondary | ICD-10-CM

## 2023-07-25 ENCOUNTER — Other Ambulatory Visit: Payer: Self-pay | Admitting: Nurse Practitioner

## 2023-07-25 DIAGNOSIS — J449 Chronic obstructive pulmonary disease, unspecified: Secondary | ICD-10-CM

## 2023-08-08 ENCOUNTER — Ambulatory Visit: Admitting: Nurse Practitioner

## 2023-08-08 NOTE — Progress Notes (Deleted)
   Established Patient Office Visit  Subjective   Patient ID: MESSIAH AHR, male    DOB: 08/30/1945  Age: 78 y.o. MRN: [id number]  No chief complaint on file.   HPI  MDD: Patient was last seen by me on 06/27/2023 at that juncture patient was having MDD and was started on sertraline  25 mg daily for 2 weeks then to increase sertraline  to 50 mg daily thereafter.  Patient was having some dizziness and lightheadedness which he described a lack of energy, sleeping too much during the day and sleeping poor during the night  Lightheadedness: Patient states this sensation will happen more after he been on his feet for extended period of time.  Patient does drink coffee and is on blood pressure medications.  {History (Optional):23778}  ROS    Objective:     There were no vitals taken for this visit. {Vitals History (Optional):23777}  Physical Exam   No results found for any visits on 08/08/23.  {Labs (Optional):23779}  The 10-year ASCVD risk score (Arnett DK, et al., 2019) is: 47.8%    Assessment & Plan:   Problem List Items Addressed This Visit   None   No follow-ups on file.    Adina Crandall, NP

## 2023-08-20 ENCOUNTER — Encounter

## 2023-10-29 ENCOUNTER — Ambulatory Visit

## 2023-10-29 VITALS — BP 138/86 | Ht 73.5 in | Wt 235.0 lb

## 2023-10-29 DIAGNOSIS — Z0000 Encounter for general adult medical examination without abnormal findings: Secondary | ICD-10-CM
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
## 2023-10-30 NOTE — Patient Instructions (Signed)
 Mr. John Blake,  Thank you for taking the time for your Medicare Wellness Visit. I appreciate your continued commitment to your health goals. Please review the care plan we discussed, and feel free to reach out if I can assist you further.  Medicare recommends these wellness visits once per year to help you and your care team stay ahead of potential health issues. These visits are designed to focus on prevention, allowing your provider to concentrate on managing your acute and chronic conditions during your regular appointments.  Please note that Annual Wellness Visits do not include a physical exam. Some assessments may be limited, especially if the visit was conducted virtually. If needed, we may recommend a separate in-person follow-up with your provider.  Ongoing Care Seeing your primary care provider every 3 to 6 months helps us  monitor your health and provide consistent, personalized care.   Referrals If a referral was made during today's visit and you haven't received any updates within two weeks, please contact the referred provider directly to check on the status.  Recommended Screenings:  Health Maintenance  Topic Date Due   Screening for Lung Cancer  Never done   Eye exam for diabetics  07/21/2017   Yearly kidney health urinalysis for diabetes  04/28/2018   Complete foot exam   01/03/2020   DTaP/Tdap/Td vaccine (2 - Td or Tdap) 02/15/2021   Flu Shot  08/17/2023   COVID-19 Vaccine (6 - 2025-26 season) 09/17/2023   Zoster (Shingles) Vaccine (1 of 2) 11/05/2024*   Hemoglobin A1C  12/27/2023   Yearly kidney function blood test for diabetes  06/26/2024   Colon Cancer Screening  10/16/2024   Medicare Annual Wellness Visit  10/28/2024   Pneumococcal Vaccine for age over 74  Completed   Hepatitis C Screening  Completed   Meningitis B Vaccine  Aged Out  *Topic was postponed. The date shown is not the original due date.       10/29/2023    3:30 PM  Advanced Directives  Does Patient  Have a Medical Advance Directive? No  Would patient like information on creating a medical advance directive? No - Patient declined   Advance Care Planning is important because it: Ensures you receive medical care that aligns with your values, goals, and preferences. Provides guidance to your family and loved ones, reducing the emotional burden of decision-making during critical moments.  Vision: Annual vision screenings are recommended for early detection of glaucoma, cataracts, and diabetic retinopathy. These exams can also reveal signs of chronic conditions such as diabetes and high blood pressure.  Dental: Annual dental screenings help detect early signs of oral cancer, gum disease, and other conditions linked to overall health, including heart disease and diabetes.  Please see the attached documents for additional preventive care recommendations.

## 2023-10-31 ENCOUNTER — Other Ambulatory Visit: Payer: Self-pay | Admitting: Nurse Practitioner

## 2023-10-31 DIAGNOSIS — J449 Chronic obstructive pulmonary disease, unspecified: Secondary | ICD-10-CM

## 2023-10-31 DIAGNOSIS — R1013 Epigastric pain: Secondary | ICD-10-CM

## 2023-10-31 DIAGNOSIS — J410 Simple chronic bronchitis: Secondary | ICD-10-CM

## 2023-10-31 MED ORDER — OMEPRAZOLE 20 MG PO CPDR: 20.0000 mg | DELAYED_RELEASE_CAPSULE | Freq: Every day | ORAL | 1 refills | Status: DC

## 2023-10-31 MED ORDER — FLUTICASONE-SALMETEROL 250-50 MCG/ACT IN AEPB: 1.0000 | INHALATION_SPRAY | Freq: Two times a day (BID) | RESPIRATORY_TRACT | 1 refills | Status: DC

## 2023-10-31 MED ORDER — ALBUTEROL SULFATE HFA 108 (90 BASE) MCG/ACT IN AERS: 1.0000 | INHALATION_SPRAY | Freq: Four times a day (QID) | RESPIRATORY_TRACT | 1 refills | Status: DC | PRN

## 2023-10-31 NOTE — Telephone Encounter (Signed)
 Copied from CRM #[id number]. Topic: Clinical - Medication Refill >> Oct 31, 2023  9:35 AM Frederich PARAS wrote: Medication: WIXELA INHUB 250-50 MCG/ACT AEPB, lbuterol (VENTOLIN  HFA) 108 (90 Base) MCG/ACT inhaler, omeprazole  (PRILOSEC) 20 MG capsule  Has the patient contacted their pharmacy? Yes Pt says he could not get through to them   This is the patient's preferred pharmacy:  [id number] [name], [place] - [address] [place] [address] Phone: [phone] Fax: [phone]  Is this the correct pharmacy for this prescription? Yes If no, delete pharmacy and type the correct one.   Has the prescription been filled recently? Yes  Is the patient out of the medication? Yes  Has the patient been seen for an appointment in the last year OR does the patient have an upcoming appointment? Yes  Can we respond through MyChart? No  Agent: Please be advised that Rx refills may take up to 3 business days. We ask that you follow-up with your pharmacy.

## 2023-11-20 ENCOUNTER — Other Ambulatory Visit: Payer: Self-pay | Admitting: Nurse Practitioner

## 2023-11-20 DIAGNOSIS — I10 Essential (primary) hypertension: Secondary | ICD-10-CM

## 2023-11-20 NOTE — Telephone Encounter (Unsigned)
 Copied from CRM [id number]. Topic: Clinical - Medication Refill >> Nov 20, 2023  1:28 PM China J wrote: Medication: amLODipine  (NORVASC ) 5 MG tablet  Has the patient contacted their pharmacy? Yes (Agent: If no, request that the patient contact the pharmacy for the refill. If patient does not wish to contact the pharmacy document the reason why and proceed with request.) (Agent: If yes, when and what did the pharmacy advise?) The patient called for a refill a but it was never received.  This is the patient's preferred pharmacy:  [phone] [name], [place] - [address] [place] [address] Phone: [phone] Fax: [phone]  Is this the correct pharmacy for this prescription? Yes If no, delete pharmacy and type the correct one.   Has the prescription been filled recently? No  Is the patient out of the medication? Yes  Has the patient been seen for an appointment in the last year OR does the patient have an upcoming appointment? Yes  Can we respond through MyChart? No  Agent: Please be advised that Rx refills may take up to 3 business days. We ask that you follow-up with your pharmacy.

## 2023-11-21 MED ORDER — AMLODIPINE BESYLATE 5 MG PO TABS: 5.0000 mg | ORAL_TABLET | Freq: Every day | ORAL | 0 refills | Status: AC

## 2023-12-25 ENCOUNTER — Telehealth: Payer: Self-pay | Admitting: Nurse Practitioner

## 2023-12-25 DIAGNOSIS — J410 Simple chronic bronchitis: Secondary | ICD-10-CM

## 2023-12-25 DIAGNOSIS — J449 Chronic obstructive pulmonary disease, unspecified: Secondary | ICD-10-CM

## 2023-12-25 MED ORDER — FLUTICASONE-SALMETEROL 250-50 MCG/ACT IN AEPB: 1.0000 | INHALATION_SPRAY | Freq: Two times a day (BID) | RESPIRATORY_TRACT | 3 refills | Status: AC

## 2023-12-25 MED ORDER — ALBUTEROL SULFATE HFA 108 (90 BASE) MCG/ACT IN AERS: 1.0000 | INHALATION_SPRAY | Freq: Four times a day (QID) | RESPIRATORY_TRACT | 1 refills | Status: AC | PRN

## 2023-12-25 NOTE — Telephone Encounter (Signed)
 Copied from CRM #[id number]. Topic: Clinical - Medication Refill >> Dec 25, 2023 10:44 AM Alfonso HERO wrote: Medication: albuterol  (VENTOLIN  HFA) 108 (90 Base) MCG/ACT inhaler fluticasone -salmeterol (WIXELA INHUB) 250-50 MCG/ACT AEPB  Has the patient contacted their pharmacy? Yes (Agent: If no, request that the patient contact the pharmacy for the refill. If patient does not wish to contact the pharmacy document the reason why and proceed with request.) (Agent: If yes, when and what did the pharmacy advise?)  This is the patient's preferred pharmacy:  [phone] [name], [place] - [address] [place] [address] Phone: [phone] Fax: [phone]    Is this the correct pharmacy for this prescription? Yes If no, delete pharmacy and type the correct one.   Has the prescription been filled recently? Yes  Is the patient out of the medication? Yes  Has the patient been seen for an appointment in the last year OR does the patient have an upcoming appointment? Yes  Can we respond through MyChart? Yes  Agent: Please be advised that Rx refills may take up to 3 business days. We ask that you follow-up with your pharmacy.

## 2024-01-31 ENCOUNTER — Other Ambulatory Visit: Payer: Self-pay | Admitting: Nurse Practitioner

## 2024-01-31 DIAGNOSIS — R1013 Epigastric pain: Secondary | ICD-10-CM

## 2024-01-31 NOTE — Telephone Encounter (Unsigned)
 Copied from CRM #[id number]. Topic: Clinical - Medication Refill >> Jan 31, 2024  3:44 PM [name] wrote: Medication: omeprazole  (PRILOSEC) 20 MG capsule  Has the patient contacted their pharmacy? Yes Pharmacy did not respond   This is the patient's preferred pharmacy:   [facility] [phone] [name], [place] - [address] [place] [address] Phone: [phone] Fax: [phone]  Is this the correct pharmacy for this prescription? Yes If no, delete pharmacy and type the correct one.   Has the prescription been filled recently? Yes  Is the patient out of the medication? No  Has the patient been seen for an appointment in the last year OR does the patient have an upcoming appointment? Yes  Can we respond through MyChart? No  Agent: Please be advised that Rx refills may take up to 3 business days. We ask that you follow-up with your pharmacy.

## 2024-02-01 MED ORDER — OMEPRAZOLE 20 MG PO CPDR: 20.0000 mg | DELAYED_RELEASE_CAPSULE | Freq: Every day | ORAL | 1 refills | Status: AC

## 2024-03-14 ENCOUNTER — Ambulatory Visit: Admitting: Nurse Practitioner
